# Patient Record
Sex: Female | Born: 1935 | Race: White | Hispanic: No | Marital: Married | Smoking: Never smoker
Health system: Southern US, Community
[De-identification: ages and names within clinical notes are randomized; demographics above are authoritative.]

## PROBLEM LIST (undated history)

## (undated) DIAGNOSIS — K222 Esophageal obstruction: Secondary | ICD-10-CM

## (undated) DIAGNOSIS — D509 Iron deficiency anemia, unspecified: Secondary | ICD-10-CM

## (undated) DIAGNOSIS — H3530 Unspecified macular degeneration: Secondary | ICD-10-CM

## (undated) DIAGNOSIS — E785 Hyperlipidemia, unspecified: Secondary | ICD-10-CM

## (undated) DIAGNOSIS — F0390 Unspecified dementia without behavioral disturbance: Secondary | ICD-10-CM

## (undated) DIAGNOSIS — K589 Irritable bowel syndrome without diarrhea: Secondary | ICD-10-CM

## (undated) DIAGNOSIS — F329 Major depressive disorder, single episode, unspecified: Secondary | ICD-10-CM

## (undated) DIAGNOSIS — F32A Depression, unspecified: Secondary | ICD-10-CM

## (undated) DIAGNOSIS — K5790 Diverticulosis of intestine, part unspecified, without perforation or abscess without bleeding: Secondary | ICD-10-CM

## (undated) DIAGNOSIS — E039 Hypothyroidism, unspecified: Secondary | ICD-10-CM

## (undated) DIAGNOSIS — M1990 Unspecified osteoarthritis, unspecified site: Secondary | ICD-10-CM

## (undated) DIAGNOSIS — N183 Chronic kidney disease, stage 3 (moderate): Secondary | ICD-10-CM

## (undated) DIAGNOSIS — F419 Anxiety disorder, unspecified: Secondary | ICD-10-CM

## (undated) DIAGNOSIS — I639 Cerebral infarction, unspecified: Secondary | ICD-10-CM

## (undated) DIAGNOSIS — K219 Gastro-esophageal reflux disease without esophagitis: Secondary | ICD-10-CM

## (undated) DIAGNOSIS — K56600 Partial intestinal obstruction, unspecified as to cause: Secondary | ICD-10-CM

## (undated) DIAGNOSIS — K449 Diaphragmatic hernia without obstruction or gangrene: Secondary | ICD-10-CM

## (undated) DIAGNOSIS — G4700 Insomnia, unspecified: Secondary | ICD-10-CM

## (undated) HISTORY — DX: Gastro-esophageal reflux disease without esophagitis: K21.9

## (undated) HISTORY — PX: TONSILLECTOMY: SUR1361

## (undated) HISTORY — PX: SINUS SURGERY WITH INSTATRAK: SHX5215

## (undated) HISTORY — PX: APPENDECTOMY: SHX54

## (undated) HISTORY — DX: Depression, unspecified: F32.A

## (undated) HISTORY — DX: Esophageal obstruction: K22.2

## (undated) HISTORY — DX: Anxiety disorder, unspecified: F41.9

## (undated) HISTORY — DX: Unspecified osteoarthritis, unspecified site: M19.90

## (undated) HISTORY — PX: BUNIONECTOMY: SHX129

## (undated) HISTORY — DX: Major depressive disorder, single episode, unspecified: F32.9

## (undated) HISTORY — PX: CHOLECYSTECTOMY: SHX55

## (undated) HISTORY — DX: Diaphragmatic hernia without obstruction or gangrene: K44.9

## (undated) HISTORY — DX: Unspecified macular degeneration: H35.30

## (undated) HISTORY — PX: SPINE SURGERY: SHX786

## (undated) HISTORY — DX: Partial intestinal obstruction, unspecified as to cause: K56.600

## (undated) HISTORY — DX: Irritable bowel syndrome, unspecified: K58.9

## (undated) HISTORY — DX: Diverticulosis of intestine, part unspecified, without perforation or abscess without bleeding: K57.90

## (undated) HISTORY — PX: ABDOMINAL HYSTERECTOMY: SHX81

## (undated) HISTORY — DX: Iron deficiency anemia, unspecified: D50.9

## (undated) HISTORY — DX: Insomnia, unspecified: G47.00

---

## 1993-11-11 DIAGNOSIS — K56600 Partial intestinal obstruction, unspecified as to cause: Secondary | ICD-10-CM

## 1993-11-11 HISTORY — DX: Partial intestinal obstruction, unspecified as to cause: K56.600

## 1998-05-01 ENCOUNTER — Other Ambulatory Visit: Admission: RE | Admit: 1998-05-01 | Discharge: 1998-05-01 | Payer: Self-pay | Admitting: Obstetrics and Gynecology

## 1998-08-01 ENCOUNTER — Other Ambulatory Visit: Admission: RE | Admit: 1998-08-01 | Discharge: 1998-08-01 | Payer: Self-pay | Admitting: Internal Medicine

## 1999-02-09 ENCOUNTER — Other Ambulatory Visit: Admission: RE | Admit: 1999-02-09 | Discharge: 1999-02-09 | Payer: Self-pay | Admitting: Obstetrics and Gynecology

## 1999-06-25 ENCOUNTER — Encounter (INDEPENDENT_AMBULATORY_CARE_PROVIDER_SITE_OTHER): Payer: Self-pay | Admitting: Specialist

## 1999-06-25 ENCOUNTER — Other Ambulatory Visit: Admission: RE | Admit: 1999-06-25 | Discharge: 1999-06-25 | Payer: Self-pay | Admitting: Obstetrics and Gynecology

## 1999-11-21 ENCOUNTER — Encounter: Admission: RE | Admit: 1999-11-21 | Discharge: 2000-02-19 | Payer: Self-pay | Admitting: Internal Medicine

## 2000-02-11 ENCOUNTER — Encounter: Admission: RE | Admit: 2000-02-11 | Discharge: 2000-02-11 | Payer: Self-pay | Admitting: *Deleted

## 2000-02-11 ENCOUNTER — Encounter: Payer: Self-pay | Admitting: *Deleted

## 2000-03-03 ENCOUNTER — Other Ambulatory Visit: Admission: RE | Admit: 2000-03-03 | Discharge: 2000-03-03 | Payer: Self-pay | Admitting: Obstetrics and Gynecology

## 2000-05-02 ENCOUNTER — Encounter: Admission: RE | Admit: 2000-05-02 | Discharge: 2000-05-02 | Payer: Self-pay | Admitting: Obstetrics and Gynecology

## 2000-05-02 ENCOUNTER — Encounter: Payer: Self-pay | Admitting: Obstetrics and Gynecology

## 2000-08-12 ENCOUNTER — Encounter: Payer: Self-pay | Admitting: Internal Medicine

## 2000-12-27 ENCOUNTER — Encounter: Payer: Self-pay | Admitting: Neurology

## 2000-12-27 ENCOUNTER — Encounter: Payer: Self-pay | Admitting: *Deleted

## 2000-12-27 ENCOUNTER — Inpatient Hospital Stay (HOSPITAL_COMMUNITY): Admission: EM | Admit: 2000-12-27 | Discharge: 2000-12-30 | Payer: Self-pay | Admitting: *Deleted

## 2000-12-28 ENCOUNTER — Encounter: Payer: Self-pay | Admitting: Neurology

## 2000-12-29 ENCOUNTER — Encounter: Payer: Self-pay | Admitting: Neurology

## 2000-12-30 ENCOUNTER — Encounter: Payer: Self-pay | Admitting: Neurology

## 2001-04-08 ENCOUNTER — Ambulatory Visit (HOSPITAL_COMMUNITY): Admission: RE | Admit: 2001-04-08 | Discharge: 2001-04-08 | Payer: Self-pay | Admitting: Internal Medicine

## 2001-04-08 ENCOUNTER — Encounter: Payer: Self-pay | Admitting: Internal Medicine

## 2001-05-12 ENCOUNTER — Other Ambulatory Visit: Admission: RE | Admit: 2001-05-12 | Discharge: 2001-05-12 | Payer: Self-pay | Admitting: Obstetrics and Gynecology

## 2001-07-07 ENCOUNTER — Encounter: Payer: Self-pay | Admitting: Obstetrics and Gynecology

## 2001-07-07 ENCOUNTER — Encounter: Admission: RE | Admit: 2001-07-07 | Discharge: 2001-07-07 | Payer: Self-pay | Admitting: Obstetrics and Gynecology

## 2001-12-30 ENCOUNTER — Encounter (HOSPITAL_COMMUNITY): Admission: RE | Admit: 2001-12-30 | Discharge: 2002-03-30 | Payer: Self-pay | Admitting: Internal Medicine

## 2002-05-12 ENCOUNTER — Other Ambulatory Visit: Admission: RE | Admit: 2002-05-12 | Discharge: 2002-05-12 | Payer: Self-pay | Admitting: Obstetrics and Gynecology

## 2002-08-04 ENCOUNTER — Encounter: Admission: RE | Admit: 2002-08-04 | Discharge: 2002-08-04 | Payer: Self-pay | Admitting: Obstetrics and Gynecology

## 2002-08-04 ENCOUNTER — Encounter: Payer: Self-pay | Admitting: Obstetrics and Gynecology

## 2003-07-29 ENCOUNTER — Encounter: Admission: RE | Admit: 2003-07-29 | Discharge: 2003-07-29 | Payer: Self-pay | Admitting: Internal Medicine

## 2003-07-29 ENCOUNTER — Encounter (HOSPITAL_BASED_OUTPATIENT_CLINIC_OR_DEPARTMENT_OTHER): Payer: Self-pay | Admitting: Internal Medicine

## 2003-08-08 ENCOUNTER — Encounter (HOSPITAL_BASED_OUTPATIENT_CLINIC_OR_DEPARTMENT_OTHER): Payer: Self-pay | Admitting: Internal Medicine

## 2003-08-08 ENCOUNTER — Encounter: Admission: RE | Admit: 2003-08-08 | Discharge: 2003-08-08 | Payer: Self-pay | Admitting: Internal Medicine

## 2003-08-23 ENCOUNTER — Ambulatory Visit (HOSPITAL_COMMUNITY): Admission: RE | Admit: 2003-08-23 | Discharge: 2003-08-23 | Payer: Self-pay | Admitting: Internal Medicine

## 2003-11-16 ENCOUNTER — Emergency Department (HOSPITAL_COMMUNITY): Admission: AD | Admit: 2003-11-16 | Discharge: 2003-11-16 | Payer: Self-pay | Admitting: Family Medicine

## 2004-03-12 ENCOUNTER — Emergency Department (HOSPITAL_COMMUNITY): Admission: EM | Admit: 2004-03-12 | Discharge: 2004-03-12 | Payer: Self-pay | Admitting: Family Medicine

## 2004-08-02 ENCOUNTER — Encounter: Admission: RE | Admit: 2004-08-02 | Discharge: 2004-08-02 | Payer: Self-pay | Admitting: Internal Medicine

## 2004-09-17 ENCOUNTER — Encounter: Admission: RE | Admit: 2004-09-17 | Discharge: 2004-09-17 | Payer: Self-pay | Admitting: Otolaryngology

## 2004-09-18 ENCOUNTER — Encounter (INDEPENDENT_AMBULATORY_CARE_PROVIDER_SITE_OTHER): Payer: Self-pay | Admitting: *Deleted

## 2004-09-18 ENCOUNTER — Ambulatory Visit (HOSPITAL_BASED_OUTPATIENT_CLINIC_OR_DEPARTMENT_OTHER): Admission: RE | Admit: 2004-09-18 | Discharge: 2004-09-18 | Payer: Self-pay | Admitting: Otolaryngology

## 2004-09-18 ENCOUNTER — Ambulatory Visit (HOSPITAL_COMMUNITY): Admission: RE | Admit: 2004-09-18 | Discharge: 2004-09-18 | Payer: Self-pay | Admitting: Otolaryngology

## 2004-12-04 ENCOUNTER — Ambulatory Visit (HOSPITAL_COMMUNITY): Admission: RE | Admit: 2004-12-04 | Discharge: 2004-12-04 | Payer: Self-pay | Admitting: Allergy and Immunology

## 2005-01-12 ENCOUNTER — Observation Stay (HOSPITAL_COMMUNITY): Admission: EM | Admit: 2005-01-12 | Discharge: 2005-01-12 | Payer: Self-pay | Admitting: *Deleted

## 2005-04-09 ENCOUNTER — Encounter: Admission: RE | Admit: 2005-04-09 | Discharge: 2005-04-09 | Payer: Self-pay | Admitting: Internal Medicine

## 2005-08-16 ENCOUNTER — Encounter: Admission: RE | Admit: 2005-08-16 | Discharge: 2005-08-16 | Payer: Self-pay | Admitting: Rheumatology

## 2005-08-21 ENCOUNTER — Encounter: Admission: RE | Admit: 2005-08-21 | Discharge: 2005-08-21 | Payer: Self-pay | Admitting: Internal Medicine

## 2006-03-19 ENCOUNTER — Encounter: Admission: RE | Admit: 2006-03-19 | Discharge: 2006-03-19 | Payer: Self-pay | Admitting: Cardiovascular Disease

## 2006-04-22 ENCOUNTER — Ambulatory Visit (HOSPITAL_COMMUNITY): Admission: RE | Admit: 2006-04-22 | Discharge: 2006-04-22 | Payer: Self-pay | Admitting: Internal Medicine

## 2006-07-16 ENCOUNTER — Encounter: Admission: RE | Admit: 2006-07-16 | Discharge: 2006-07-16 | Payer: Self-pay | Admitting: Internal Medicine

## 2006-09-24 ENCOUNTER — Ambulatory Visit: Payer: Self-pay | Admitting: Internal Medicine

## 2006-10-07 ENCOUNTER — Ambulatory Visit: Payer: Self-pay | Admitting: Internal Medicine

## 2007-03-26 ENCOUNTER — Ambulatory Visit (HOSPITAL_COMMUNITY): Admission: RE | Admit: 2007-03-26 | Discharge: 2007-03-26 | Payer: Self-pay | Admitting: Internal Medicine

## 2007-03-30 ENCOUNTER — Ambulatory Visit (HOSPITAL_COMMUNITY): Admission: RE | Admit: 2007-03-30 | Discharge: 2007-03-30 | Payer: Self-pay | Admitting: Internal Medicine

## 2007-05-13 ENCOUNTER — Ambulatory Visit: Payer: Self-pay | Admitting: Internal Medicine

## 2007-05-13 LAB — CONVERTED CEMR LAB
Basophils Absolute: 0 10*3/uL (ref 0.0–0.1)
Basophils Relative: 0.7 % (ref 0.0–1.0)
Eosinophils Absolute: 0.6 10*3/uL (ref 0.0–0.6)
Eosinophils Relative: 9.2 % — ABNORMAL HIGH (ref 0.0–5.0)
Ferritin: 145.8 ng/mL (ref 10.0–291.0)
HCT: 38.9 % (ref 36.0–46.0)
Hemoglobin: 12.9 g/dL (ref 12.0–15.0)
Iron: 54 ug/dL (ref 42–145)
Lymphocytes Relative: 23 % (ref 12.0–46.0)
MCHC: 33.2 g/dL (ref 30.0–36.0)
MCV: 90.3 fL (ref 78.0–100.0)
Monocytes Absolute: 0.5 10*3/uL (ref 0.2–0.7)
Monocytes Relative: 7.6 % (ref 3.0–11.0)
Neutro Abs: 3.9 10*3/uL (ref 1.4–7.7)
Neutrophils Relative %: 59.5 % (ref 43.0–77.0)
Platelets: 251 10*3/uL (ref 150–400)
RBC: 4.31 M/uL (ref 3.87–5.11)
RDW: 23.9 % — ABNORMAL HIGH (ref 11.5–14.6)
Saturation Ratios: 17.3 % — ABNORMAL LOW (ref 20.0–50.0)
Transferrin: 222.5 mg/dL (ref >=212.0)
WBC: 6.5 10*3/uL (ref 4.5–10.5)

## 2007-05-26 ENCOUNTER — Ambulatory Visit: Payer: Self-pay | Admitting: Internal Medicine

## 2007-05-26 ENCOUNTER — Encounter: Payer: Self-pay | Admitting: Internal Medicine

## 2007-12-30 ENCOUNTER — Encounter: Admission: RE | Admit: 2007-12-30 | Discharge: 2007-12-30 | Payer: Self-pay | Admitting: Internal Medicine

## 2008-03-12 DIAGNOSIS — D509 Iron deficiency anemia, unspecified: Secondary | ICD-10-CM

## 2008-03-12 DIAGNOSIS — K5730 Diverticulosis of large intestine without perforation or abscess without bleeding: Secondary | ICD-10-CM | POA: Insufficient documentation

## 2008-03-12 DIAGNOSIS — K222 Esophageal obstruction: Secondary | ICD-10-CM

## 2008-03-12 DIAGNOSIS — F341 Dysthymic disorder: Secondary | ICD-10-CM

## 2008-03-12 DIAGNOSIS — D649 Anemia, unspecified: Secondary | ICD-10-CM

## 2008-03-12 DIAGNOSIS — K449 Diaphragmatic hernia without obstruction or gangrene: Secondary | ICD-10-CM | POA: Insufficient documentation

## 2008-03-12 DIAGNOSIS — M129 Arthropathy, unspecified: Secondary | ICD-10-CM | POA: Insufficient documentation

## 2008-03-12 DIAGNOSIS — J45909 Unspecified asthma, uncomplicated: Secondary | ICD-10-CM | POA: Insufficient documentation

## 2008-04-18 ENCOUNTER — Ambulatory Visit (HOSPITAL_COMMUNITY): Admission: RE | Admit: 2008-04-18 | Discharge: 2008-04-18 | Payer: Self-pay | Admitting: Surgery

## 2008-12-23 ENCOUNTER — Encounter: Admission: RE | Admit: 2008-12-23 | Discharge: 2008-12-23 | Payer: Self-pay | Admitting: Internal Medicine

## 2009-09-12 ENCOUNTER — Encounter: Admission: RE | Admit: 2009-09-12 | Discharge: 2009-09-12 | Payer: Self-pay | Admitting: Internal Medicine

## 2009-10-02 ENCOUNTER — Ambulatory Visit: Payer: Self-pay | Admitting: Cardiology

## 2009-10-02 ENCOUNTER — Inpatient Hospital Stay (HOSPITAL_COMMUNITY): Admission: EM | Admit: 2009-10-02 | Discharge: 2009-10-10 | Payer: Self-pay | Admitting: Emergency Medicine

## 2009-10-03 ENCOUNTER — Encounter (HOSPITAL_BASED_OUTPATIENT_CLINIC_OR_DEPARTMENT_OTHER): Payer: Self-pay | Admitting: Internal Medicine

## 2009-10-04 ENCOUNTER — Ambulatory Visit: Payer: Self-pay | Admitting: Physical Medicine & Rehabilitation

## 2009-10-09 ENCOUNTER — Encounter (HOSPITAL_BASED_OUTPATIENT_CLINIC_OR_DEPARTMENT_OTHER): Payer: Self-pay | Admitting: Internal Medicine

## 2009-10-10 ENCOUNTER — Ambulatory Visit: Payer: Self-pay | Admitting: Surgery

## 2009-10-10 ENCOUNTER — Ambulatory Visit: Payer: Self-pay | Admitting: Physical Medicine & Rehabilitation

## 2009-10-10 ENCOUNTER — Inpatient Hospital Stay (HOSPITAL_COMMUNITY)
Admission: RE | Admit: 2009-10-10 | Discharge: 2009-10-23 | Payer: Self-pay | Admitting: Physical Medicine & Rehabilitation

## 2009-10-10 ENCOUNTER — Encounter (HOSPITAL_BASED_OUTPATIENT_CLINIC_OR_DEPARTMENT_OTHER): Payer: Self-pay | Admitting: Internal Medicine

## 2009-10-12 ENCOUNTER — Ambulatory Visit: Payer: Self-pay | Admitting: Psychology

## 2009-12-22 ENCOUNTER — Telehealth: Payer: Self-pay | Admitting: Internal Medicine

## 2010-01-16 ENCOUNTER — Telehealth: Payer: Self-pay | Admitting: Internal Medicine

## 2010-01-18 ENCOUNTER — Telehealth: Payer: Self-pay | Admitting: Internal Medicine

## 2010-02-02 ENCOUNTER — Ambulatory Visit (HOSPITAL_COMMUNITY): Admission: RE | Admit: 2010-02-02 | Discharge: 2010-02-02 | Payer: Self-pay | Admitting: Surgery

## 2010-09-19 ENCOUNTER — Encounter: Admission: RE | Admit: 2010-09-19 | Discharge: 2010-09-19 | Payer: Self-pay | Admitting: Internal Medicine

## 2010-12-11 NOTE — Progress Notes (Signed)
Summary: Running low on her meds  Phone Note Call from Patient Call back at Home Phone [phone]   Caller: Mr Mcqueary Call For: Dr Juanda Chance Summary of Call: Was given [facility] kind [facility] liquid [facility] and used most of it up and the pharmacy wont let them refill it because it's soon. Wonders if one spoon everyday is wrong and is taking too much? Initial call taken by: Leanor Kail [facility],  January 16, 2010 10:21 AM  Follow-up for Phone Call        I have advised patient that she should only be taking 1 teaspoon (5 ml) of megace daily. I have advised her that if she is out of the medication, then she may be taking the prescription wrong-maybe taking 1 tablespoon instead???. Patient was given 10 ounces megace on 12/22/09 which converts into 295 ml. If she takes 5 ml daily x 30 days, she has taken 150 ml which would mean she should still have 1/2 bottle left over. Patient states that she has enough to get her through until 3/11 when she can get another refill and insists that she is taking the correct amount of 5 ml once daily. I have asked her to purchase some sort of measuring device that shows ml's so she will be sure she is taking the correct amount. Patient verbalizes understanding. Follow-up by: Hortense Ramal CMA Duncan Dull),  January 16, 2010 2:50 PM

## 2010-12-11 NOTE — Progress Notes (Signed)
Summary: Triage-Loss of appetite  Phone Note Call from Patient Call back at [facility] Phone [phone]   Caller: Patient Call For: Dr. Juanda Chance Reason for Call: Talk to Nurse Summary of Call: Pt. is having stomach problems in the mornings. Nauseated and no appetite. Initial call taken by: Karna Christmas,  December 22, 2009 10:09 AM  Follow-up for Phone Call        Pt. states she had a stroke 2 monthes ago, she has lost her appetite, worse in the mornings. Pt. states she has lost 2-3 pounds. States Dr. Eloise Harman advised her call Dr.Veretta Sabourin.  1) Eat smaller, more frequent meals 2) Supplement diet with Boost, Ensure or Carnation Instant Breakfast 2-3x daily. 3) I will call pt., with new orders, after MD reviews.  DR.Amir Glaus PLEASE ADVISE  Follow-up by: Laureen Ochs LPN,  December 22, 2009 10:50 AM  Additional Follow-up for Phone Call Additional follow up Details #1::        I saw her in a grocery store last week and we spoke some. She needs to be on some sort of acid reducer, Prelosec 20 mg daily , # 30, 2 refills. Start Megace liquid 40mg /1cc, Disp 10oz, 1 teaspoon by mouth once daily for apetite., 1 refill Additional Follow-up by: Hart Carwin MD,  December 22, 2009 3:44 PM    Additional Follow-up for Phone Call Additional follow up Details #2::    Above MD orders reviewed with patient. Meds to her pharmacy. Pt. instructed to call back as needed.  Follow-up by: Laureen Ochs LPN,  December 22, 2009 4:06 PM  New/Updated Medications: OMEPRAZOLE 20 MG CPDR (OMEPRAZOLE) Take 1 by mouth every morning 30 minutes before breakfast. MEGACE ORAL 40 MG/ML SUSP (MEGESTROL ACETATE) Take 1 tsp. by mouth once daily. Prescriptions: MEGACE ORAL 40 MG/ML SUSP (MEGESTROL ACETATE) Take 1 tsp. by mouth once daily.  #10oz. x 1   Entered by:   Laureen Ochs LPN   Authorized by:   Hart Carwin MD   Signed by:   Laureen Ochs LPN on 09/81/1914   Method used:   [facility]        [facility] [address] [phone]* (retail)       [address]       [place], [place]  [address]       Ph: [phone] or [phone]       Fax: [phone]   RxID:   [id number] OMEPRAZOLE 20 MG CPDR (OMEPRAZOLE) Take 1 by mouth every morning 30 minutes before breakfast.  #30 x 6   Entered by:   Laureen Ochs LPN   Authorized by:   Hart Carwin MD   Signed by:   Laureen Ochs LPN on 56/43/3295   Method used:   [facility]        [facility] [address] [phone]* (retail)       [address]       [place], [place]  [address]       Ph: [phone] or [phone]       Fax: [phone]   RxID:   [id number]

## 2010-12-11 NOTE — Progress Notes (Signed)
Summary: Triage  Phone Note Call from Patient Call back at Home Phone [phone]   Caller: spouse  Call For: Melanie Levy Reason for Call: Talk to Nurse Summary of Call: Patient wants to speak to nurse regarding medication (megace) Initial call taken by: Tawni Levy,  January 18, 2010 1:51 PM  Follow-up for Phone Call        Pt. states she is almost out of Megace, cannot get it refilled for 10 days, she wants a new script sent over. I spoke w/the Pharmacist at her pharmacy, pt. was given a 60 day supply on 12-22-09, she cannot get a refill until 02-05-10. I explained to pt. that she should only take 1 tsp. full every day, she needs to measure carefully. She will have to use what she has and then wait until 02-05-10 to get a refill and be careful about her dose in the future. Pt. instructed to call back as needed.  Follow-up by: Laureen Ochs LPN,  January 18, 2010 2:54 PM

## 2011-02-12 LAB — COMPREHENSIVE METABOLIC PANEL
ALT: 22 U/L (ref 0–35)
AST: 24 U/L (ref 0–37)
Albumin: 3.2 g/dL — ABNORMAL LOW (ref 3.5–5.2)
Alkaline Phosphatase: 59 U/L (ref 39–117)
BUN: 14 mg/dL (ref 6–23)
CO2: 24 mEq/L (ref 19–32)
Calcium: 8.4 mg/dL (ref 8.4–10.5)
Chloride: 104 mEq/L (ref 96–112)
Creatinine, Ser: 0.97 mg/dL (ref 0.4–1.2)
GFR calc Af Amer: >60 mL/min (ref >60)
GFR calc non Af Amer: 55 mL/min — ABNORMAL LOW (ref >60)
Glucose, Bld: 104 mg/dL — ABNORMAL HIGH (ref 70–99)
Potassium: 3.7 mEq/L (ref 3.5–5.1)
Sodium: 134 mEq/L — ABNORMAL LOW (ref 135–145)
Total Bilirubin: 0.4 mg/dL (ref 0.3–1.2)
Total Protein: 5.6 g/dL — ABNORMAL LOW (ref 6.0–8.3)

## 2011-02-12 LAB — URINALYSIS, ROUTINE W REFLEX MICROSCOPIC
Bilirubin Urine: NEGATIVE
Glucose, UA: NEGATIVE mg/dL
Hgb urine dipstick: NEGATIVE
Ketones, ur: NEGATIVE mg/dL
Nitrite: NEGATIVE
Protein, ur: NEGATIVE mg/dL
Specific Gravity, Urine: 1.01 (ref 1.005–1.030)
Urobilinogen, UA: 0.2 mg/dL (ref 0.0–1.0)
pH: 6.5 (ref 5.0–8.0)

## 2011-02-12 LAB — CBC
HCT: 32.8 % — ABNORMAL LOW (ref 36.0–46.0)
Hemoglobin: 11.3 g/dL — ABNORMAL LOW (ref 12.0–15.0)
MCHC: 34.3 g/dL (ref 30.0–36.0)
MCV: 102.2 fL — ABNORMAL HIGH (ref 78.0–100.0)
Platelets: 302 10*3/uL (ref 150–400)
RBC: 3.21 MIL/uL — ABNORMAL LOW (ref 3.87–5.11)
RDW: 12.8 % (ref 11.5–15.5)
WBC: 6.7 10*3/uL (ref 4.0–10.5)

## 2011-02-12 LAB — BASIC METABOLIC PANEL
BUN: 13 mg/dL (ref 6–23)
Creatinine, Ser: 1.18 mg/dL (ref 0.4–1.2)
GFR calc Af Amer: 53 mL/min — ABNORMAL LOW (ref >60)
GFR calc non Af Amer: 44 mL/min — ABNORMAL LOW (ref >60)

## 2011-02-12 LAB — PREALBUMIN: Prealbumin: 28.9 mg/dL (ref 18.0–45.0)

## 2011-02-12 LAB — DIFFERENTIAL
Basophils Absolute: 0.1 10*3/uL (ref 0.0–0.1)
Basophils Relative: 1 % (ref 0–1)
Eosinophils Absolute: 0.3 10*3/uL (ref 0.0–0.7)
Eosinophils Relative: 4 % (ref 0–5)
Lymphocytes Relative: 21 % (ref 12–46)
Lymphs Abs: 1.4 10*3/uL (ref 0.7–4.0)
Monocytes Absolute: 0.6 10*3/uL (ref 0.1–1.0)
Monocytes Relative: 9 % (ref 3–12)
Neutro Abs: 4.4 10*3/uL (ref 1.7–7.7)
Neutrophils Relative %: 65 % (ref 43–77)

## 2011-02-12 LAB — URINE CULTURE
Colony Count: 75000
Colony Count: NO GROWTH
Culture: NO GROWTH

## 2011-02-13 LAB — CBC
HCT: 36.4 % (ref 36.0–46.0)
Hemoglobin: 10.8 g/dL — ABNORMAL LOW (ref 12.0–15.0)
Hemoglobin: 11.1 g/dL — ABNORMAL LOW (ref 12.0–15.0)
Hemoglobin: 12.4 g/dL (ref 12.0–15.0)
MCHC: 33.6 g/dL (ref 30.0–36.0)
MCHC: 33.8 g/dL (ref 30.0–36.0)
MCV: 103 fL — ABNORMAL HIGH (ref 78.0–100.0)
MCV: 103.5 fL — ABNORMAL HIGH (ref 78.0–100.0)
MCV: 103.5 fL — ABNORMAL HIGH (ref 78.0–100.0)
RBC: 3.1 MIL/uL — ABNORMAL LOW (ref 3.87–5.11)
RBC: 3.19 MIL/uL — ABNORMAL LOW (ref 3.87–5.11)
RBC: 3.53 MIL/uL — ABNORMAL LOW (ref 3.87–5.11)
RDW: 12.6 % (ref 11.5–15.5)
WBC: 9.6 10*3/uL (ref 4.0–10.5)
WBC: 9.9 10*3/uL (ref 4.0–10.5)

## 2011-02-13 LAB — URINALYSIS, ROUTINE W REFLEX MICROSCOPIC
Bilirubin Urine: NEGATIVE
Ketones, ur: NEGATIVE mg/dL
Nitrite: NEGATIVE
Protein, ur: NEGATIVE mg/dL
Urobilinogen, UA: 0.2 mg/dL (ref 0.0–1.0)

## 2011-02-13 LAB — TSH: TSH: 3.276 u[IU]/mL (ref 0.350–4.500)

## 2011-02-13 LAB — COMPREHENSIVE METABOLIC PANEL
ALT: 14 U/L (ref 0–35)
AST: 20 U/L (ref 0–37)
CO2: 24 mEq/L (ref 19–32)
Calcium: 8.8 mg/dL (ref 8.4–10.5)
Chloride: 106 mEq/L (ref 96–112)
GFR calc Af Amer: 57 mL/min — ABNORMAL LOW (ref >60)
GFR calc non Af Amer: 47 mL/min — ABNORMAL LOW (ref >60)
Sodium: 138 mEq/L (ref 135–145)

## 2011-02-13 LAB — DIFFERENTIAL
Eosinophils Absolute: 0.2 10*3/uL (ref 0.0–0.7)
Eosinophils Relative: 2 % (ref 0–5)
Lymphs Abs: 1.2 10*3/uL (ref 0.7–4.0)
Monocytes Relative: 6 % (ref 3–12)

## 2011-02-13 LAB — BASIC METABOLIC PANEL
CO2: 23 mEq/L (ref 19–32)
CO2: 25 mEq/L (ref 19–32)
Calcium: 8.3 mg/dL — ABNORMAL LOW (ref 8.4–10.5)
Calcium: 8.5 mg/dL (ref 8.4–10.5)
Chloride: 103 mEq/L (ref 96–112)
Chloride: 114 mEq/L — ABNORMAL HIGH (ref 96–112)
GFR calc Af Amer: >60 mL/min (ref >60)
Glucose, Bld: 109 mg/dL — ABNORMAL HIGH (ref 70–99)
Sodium: 132 mEq/L — ABNORMAL LOW (ref 135–145)
Sodium: 143 mEq/L (ref 135–145)

## 2011-02-13 LAB — URINALYSIS, MICROSCOPIC ONLY
Glucose, UA: NEGATIVE mg/dL
Specific Gravity, Urine: 1.01 (ref 1.005–1.030)
pH: 5.5 (ref 5.0–8.0)

## 2011-02-13 LAB — URINE CULTURE

## 2011-02-13 LAB — CK TOTAL AND CKMB (NOT AT ARMC): Relative Index: INVALID (ref 0.0–2.5)

## 2011-02-13 LAB — LIPID PANEL
Cholesterol: 142 mg/dL (ref 0–200)
LDL Cholesterol: 63 mg/dL (ref 0–99)

## 2011-02-13 LAB — TROPONIN I
Troponin I: 0.03 ng/mL (ref 0.00–0.06)
Troponin I: 0.03 ng/mL (ref 0.00–0.06)

## 2011-02-13 LAB — RPR: RPR Ser Ql: NONREACTIVE

## 2011-03-14 ENCOUNTER — Ambulatory Visit (HOSPITAL_COMMUNITY): Payer: Medicare Other | Attending: Surgery

## 2011-03-14 DIAGNOSIS — M810 Age-related osteoporosis without current pathological fracture: Secondary | ICD-10-CM | POA: Insufficient documentation

## 2011-03-26 NOTE — Assessment & Plan Note (Signed)
[facility]                         GASTROENTEROLOGY OFFICE NOTE   NAME:Melanie Levy, Melanie Levy                    MRN:          [id number]  DATE:05/26/2007                            DOB:          1928-09-10    PROCEDURE:  Small bowel capsule endoscopy.   Please see the full report and photos in the office chart, and on the  computer generated report.   The findings of this study showed a completed study but much of small  bowel mucosa obscured by dark fluid.   There is a red spot in the gastric antrum that I suspect is related to  gastritis, AVM much less likely.  There is a red patch of mucosa in the  jejunum vs. partially digested food that is retained and red.  The exam  was limited by poor preparation.  Further plans per Dr. Lina Sar who  ordered the study.     Iva Boop, MD,FACG  Electronically Signed    CEG/MedQ  DD: 05/27/2007  DT: 05/28/2007  Job #: [id number]   cc:   Barry Dienes. Eloise Harman, M.D.

## 2011-03-26 NOTE — Assessment & Plan Note (Signed)
[facility]                         GASTROENTEROLOGY OFFICE NOTE   NAME:MACKENZIEShaterica, Mcclatchy                    MRN:          [id number]  DATE:05/13/2007                            DOB:          [birth year]    Ms. Brotzman is a 77 year old white female who is here today for  evaluation of iron deficiency anemia.  We have not received reports of  her blood count from Dr. Jarold Motto, but she reports that she was told to  have a low blood count.  We had seen Ms. Ronda on multiple occasions  in the past for partial small bowel obstruction in 1995, gastric ulcer  in 1994, and iron deficiency anemia which was fully evaluated with  several endoscopies, and a last colonoscopy in October 2001.  She has a  history of esophageal stricture, dilated last time in November 2007.  She had severe iron deficiency anemia in 2002 when her iron saturation  was down to 3%.  She received iron infusion, in a subsequent year her  iron saturation fell down again to 4%.  She always has responded to oral  and intravenous iron.  In my records, her last hemoglobin in March 2004  was up to 12.2, hematocrit 37.6.  She has not been taking any oral  supplements, or at least I have not reported any on her medication  survey today.  She denies currently any abdominal pain or symptoms of  gastroesophageal reflux.  Her bowel habits are normal to somewhat  diarrheal.   MEDICATIONS:  1. Levothyroxine 25 mcg p.o. daily.  2. Atenolol 25 mg p.o. daily.  3. Temazepam 30 mg p.o. daily.  4. Tylenol nebulizer.  5. Lactrase.   PHYSICAL EXAMINATION:  Blood pressure 138/62, pulse 62 and weight 132  pounds.  She was alert, oriented, and in no distress.  Sclerae nonicteric.  Oral cavity was normal.  LUNGS:  Clear to auscultation.  COR:  Normal S1, normal S2.  ABDOMEN:  Soft with healed surgical scars.  Normoactive bowel sounds.  No tenderness.  RECTAL EXAM:  Soft, Hemoccult-negative stool.   IMPRESSION:  A 77 year old white female with a history of chronic iron  deficiency anemia, responsive to iron supplements and iron infusions.  We have not been able to identify the source of the bleeding over the  past several years.  AV malformation would be a possibility.  Also  possible chronic esophagitis with esophageal stricture but that is less  likely scenario since her reflux really has not been clinically  significant.   PLAN:  1. Small bowel capsule endoscopy scheduled to rule out AVM.  2. If negative, she may need another colonoscopy.  Last one was done 7      years ago.  3. Recheck her CBC and iron studies today.  She reports having an      infusion, and I think it      might have been an iron infusion in the last several weeks, ordered      by Dr. Jarold Motto.  She ought to be on oral supplements on a chronic      basis.  Hedwig Morton. Juanda Chance, MD  Electronically Signed    DMB/MedQ  DD: 05/13/2007  DT: 05/13/2007  Job #: [id number]   cc:   Ivery Quale, MD

## 2011-03-29 NOTE — Discharge Summary (Signed)
[place]  Patient:    Melanie Levy, Melanie Levy                    MRN: [id number] Adm. Date:  [id number] Disc. Date: 12/30/00 Attending:  Lesly Levy CC:         Melanie Levy, M.D.   Discharge Summary  ADMISSION DIAGNOSES: 1. Episode of headache, blurred vision, near syncope; rule out transient    ischemic attack. 2. History of asthma and allergies.  DISCHARGE DIAGNOSES: 1. Episode of headache, blurred vision, near syncope; probable migraine event. 2. Left lower lobe nodule.  PROCEDURES: 1. CT scan of the brain. 2. MRI scan of the brain. 3. MRI angiogram. 4. Carotid Doppler study. 5. Two dimensional echocardiogram. 6. CT scan of the chest.  COMPLICATIONS:  Complications for the above procedures is none.  HISTORY OF PRESENT ILLNESS:  Melanie Levy is a 77 year old right-handed white female born Aug 04, 1928, with a history of allergies and asthma.  The patient is followed by Dr. Ivery Levy and has complained of some problems with headaches "all of her life."  The patient has attributed the headaches to "allergies."  The patient describes rapid onset headache with certain perfumes or odors and the headaches are occurring fairly frequently. The patient was brought into the hospital after an event that occurred on the day of admission with visual blurring, tingling sensation of the left arm, and a feeling of near syncope.  The patient was brought to the hospital for further evaluation.  A CT scan of the brain showed some basilar ganglion calcifications and calcifications of the vertebral arteries but otherwise no significant change noted.  The patient was brought in to rule out TIA event.  PAST MEDICAL HISTORY: 1. History of asthma. 2. History of arthritis. 3. History of headache and near syncopal event on the day of admission. 4. Status post tonsillectomy. 5. History of appendectomy. 6. History of low back  surgery. 7. History of gallbladder surgery. 8. History of abdominal complaints, followed by Dr. Lina Levy. 9. Questionable peptic ulcer disease.  SOCIAL HISTORY:  The patient does not smoke.  Drinks alcohol on occasion.  ALLERGIES:  She has an allergy to penicillin.  MEDICATIONS:  She was on no medications at the time of admission.  HISTORY OF PRESENT ILLNESS:  Please refer to history and physical dictation summary for social history, family history, review of systems, physical examination.  LABORATORY DATA:  The laboratory values are notable for CPK of 707, MB fraction 1.5, troponin-I level 0.1.  Repeat CPK was 397.  White count 7.4, hemoglobin 11.9, hematocrit 35.8, MCV 98.4, platelets of 287,000.  INR 0.9. Sodium 137, potassium 4.8, chloride 107, CO2 26, glucose 90, BUN 13, creatinine 0.9, calcium 8.6, total protein 6.1, albumin 3.8, AST 43, ALT 17, alkaline phosphatase 100, total bilirubin 1.0.  EKG reveals normal sinus rhythm and normal EKG.  Chest x-ray shows probable chronic obstructive pulmonary disease, possible soft tissue mass at the right base.  HOSPITAL COURSE:  The patient has done fairly well.  The patient was set up for an MRI scan of the brain which showed no acute abnormality.  Generalized atrophy was seen.  The patient has had an MRI angiogram that shows evidence of suboptimal exam but no significant vascular stenosis is seen.  Carotid Doppler study was also performed showing evidence of 40-60% stenosis of the left internal carotid artery.  The right internal carotid artery appeared to be normal.  Antegrade flow of the vertebral arteries was seen.  The patient underwent a 2-D echocardiogram which was unremarkable with evidence of normal left ventricular function, 55-60% ejection fraction, and mild aortic valvular regurgitation was seen.  No mitral valve prolapse was noted.  The patient has been set up for a CT scan of the chest which has not yet been done.   If this study is unremarkable, the patient will be discharged to home today.  The patient will be on aspirin 325 mg one a day.  There is a possibility that the patient may be having frequent migraine.  This will need to be evaluated as an outpatient.  A trial on Depakote may be appropriate. The patient appeared to have evidence in the hospital of some mild memory deficits.  Further investigation of this should be done as an outpatient.  FOLLOW-UP:  The patient will follow up with Melanie Levy to see Dr. Shari Levy in three weeks.  DISCHARGE INSTRUCTIONS:  No dietary restrictions. DD:  12/30/00 TD:  12/30/00 Job: [id number]

## 2011-03-29 NOTE — Op Note (Signed)
NAMELEILANEE, RIGHETTI             ACCOUNT NO.:  [id number]   MEDICAL RECORD NO.:  [id number]          PATIENT TYPE:  AMB   LOCATION:  DSC                          FACILITY:  [facility]   PHYSICIAN:  Christopher E. Ezzard Standing, M.D.DATE OF BIRTH:  04/11/1928   DATE OF PROCEDURE:  09/18/2004  DATE OF DISCHARGE:                                 OPERATIVE REPORT   PREOPERATIVE DIAGNOSIS:  Bilateral polypoid sinus disease with bilateral  ethmoid and maxillary sinus polyps.   POSTOPERATIVE DIAGNOSIS:  Bilateral polypoid sinus disease with bilateral  ethmoid and maxillary sinus polyps.   OPERATION PERFORMED:  Functional endoscopic sinus surgery with bilateral  ethmoidectomies with removal of polyps, bilateral maxillary ostial  enlargement with removal of polyps.  Simple submucosal cauterization  inferior turbinate reductions.   SURGEON:  Kristine Garbe. Ezzard Standing, M.D.   ANESTHESIA:  General endotracheal.   COMPLICATIONS:  None.   INDICATIONS FOR PROCEDURE:  Laterra Lubinski is a 77 year old female who has  had chronic problems with her sinuses.  She has allergies for which she sees  Dr. Corinda Gubler.  Over the last several months, she has been having daily  headaches which she feels are related to her sinuses with headaches between  the eyes and the cheek region.  On CT scan this showed polypoid disease  within the ethmoid and maxillary sinuses bilaterally, right side a little  bit worse than the left.  The frontal sinuses and sphenoid sinuses were  relatively.  Because of the persistent headache and polypoid disease, she  was taken to the operating room at this time for endoscopic ethmoidectomy  and maxillary ostial enlargement.   DESCRIPTION OF PROCEDURE:  After adequate endotracheal anesthesia, the  patient received 1 g Ancef IV intraoperatively.  Nose was prepped with  cotton pledgets soaked in decongestant and the middle meatus regions were  injected with Xylocaine with epinephrine for  hemostasis.  Using the 0 degree  endoscope, the right side was approached first.  The uncinate process was  excised and removed.  The anterior ethmoid regions were opened up.  There  was some thickened mucosa in the ethmoid sinuses anteriorly.  Opening more  posteriorly there was one area of thickened mucosa and polyp disease within  the posterior ethmoid on the right side.  The maxillary ostia was identified  with a curved suction and was enlarged with backbiting and straight through  cut forceps.  There were one or two polyps that could be reached around the  opening of the maxillary ostia which were removed.  There were a few other  smaller polyps along the floor of the right maxillary sinus which could not  really be adequately reached with instruments but could be visualized with a  33 scope.  After opening up the maxillary ostia on the right side and  removing a few of the polyps, the right side was completed.  Next, attention  was carried to the left side.  Again, the uncinate process was incised and  removed anterior ethmoid areas were opened up with straight through cut  forceps.  The microdebrider was used to open up some  of the anterior and  posterior ethmoid region.  There was some thickened mucosa but minimal  polypoid disease.  Most of the more thickened mucosa was anteriorly within  the ethmoid on the left side.  The maxillary ostia was identified with  curved suction and enlarged with backbiting and straight through cut  forceps.  The maxillary sinus on the left side was relatively clear.  This  completed the left ethmoidectomy and maxillary ostial enlargement.  Using  the bipolar cautery submucosal cauterization was performed of the inferior  turbinates bilaterally.  Kennedy sinus packs were placed  within the ethmoid  regions bilaterally and hydrated with Xylocaine with epinephrine.  This  completed the procedure.  Janeann was awakened from anesthesia and transferred  to  recovery room postoperatively doing well.   DISPOSITION:  Tomoko is discharged home later this morning on Z-Pack for five  days as she has allergies to penicillin and Tylenol and Vicodin as needed  for pain.  Will have her follow up in my office in three days for recheck  and remove the [facility] sinus packs.       CEN/MEDQ  D:  09/18/2004  T:  09/18/2004  Job:  [id number]   cc:   Barry Dienes. Eloise Harman, M.D.  [address]  [place]  [place] [address]  Fax: [phone]

## 2011-03-29 NOTE — Op Note (Signed)
NAME:  ORIEL, OJO                       ACCOUNT NO.:  [id number]   MEDICAL RECORD NO.:  [id number]                   PATIENT TYPE:  AMB   LOCATION:  ENDO                                 FACILITY:  [facility]   PHYSICIAN:  Lina Sar, M.D. [facility]               DATE OF BIRTH:  08/22/1928   DATE OF PROCEDURE:  DATE OF DISCHARGE:                                 OPERATIVE REPORT   PROCEDURE:  Upper endoscopy.   INDICATIONS:  This 77 year old white female has a history of benign distal  esophageal stricture, which was dilated the last time in January of 2003,  using [facility] dilator.  She has had recurrence of the dysphagia for solids,  despite on being on Nexium 40 mg a day.  Most recently, she switched to H2  receptor antagonists because of the expense with the PPIs.  She is  undergoing upper endoscopy and esophageal dilatation.  She has also a  history of iron deficiency anemia, which was evaluated in the past.  Her  colonoscopy was negative in 2001.   ENDOSCOPE:  Olympus single channel video endoscope.   SEDATION:  Versed 6 mg IV, fentanyl 62.5 mg IV.   FINDINGS:  Olympus single channel video endoscope passed under vision  through the posterior pharynx and into the esophagus.  The patient was  monitored by pulse oximeter.  Her oxygen saturations were normal.  She was  operative.  The proximal and midesophageal mucosa was unremarkable. There  was a mild esophageal stricture of the distal esophagus at 35 cm from the  incisors.  The diameter was about 14 mm, which allowed the endoscope to  traverse into the stomach without difficulty.  There was a small  nonreducible hiatal hernia which was not clinically significant.   STOMACH:  Stomach was insufflated with air, showed normal rugal folds and  body of the stomach.  There were multiple shallow pinpoint erosions  throughout the gastric antrum, some of them covered with exudate.  At least  ten of them were noted.  Previous CLOtest was  negative.   DUODENUM:  The duodenal bulb and descending duodenum was normal.  A guide  wire was then placed, endoscopic, into the stomach, and the scope was  retracted and Savary dilators passed over the guide wire without  fluoroscopic guidance.  Using 14 mm, 16 mm, and 17 mm dilators, there was  some blood on the last dilator.  The patient tolerated procedure well.   IMPRESSION:  1. Benign distal esophageal stricture, status post dilation to 17 mm.  2. Antral gastritis.   PLAN:  1. Continue Nexium, which was given to her as samples. The patient will     continue on the samples until she runs     out of them and then she will ask for more samples.  2. Avoid aspirin or any NSAIDs.  3. Antireflux measures.  Lina Sar, M.D. [facility]    DB/MEDQ  D:  08/23/2003  T:  08/23/2003  Job:  [id number]

## 2011-03-29 NOTE — Assessment & Plan Note (Signed)
[facility]                           GASTROENTEROLOGY OFFICE NOTE   NAME:Melanie Levy, Melanie Levy                    MRN:          [id number]  DATE:09/24/2006                            DOB:          [birth year]    Melanie Levy is a 77 year old white female whom we have followed for  gastroesophageal reflux disease, benign distal esophageal stricture, last  dilatation in November 2003.  She also has a history of partial small-bowel  obstruction and irritable bowel syndrome.  She had a gastric antral ulcer in  1994 which has been treated with chronic acid suppressing agent. She called  about a month ago with recurrence of epigastric pain and dysphagia.  We have  doubled up her Prilosec to 20 mg twice a day.  She also found some Protonix  at home which she has been taking with great relief of her substernal  discomfort and heartburn. She still comes back today with complaints of  intermittent solid food dysphagia.   PAST HISTORY:  Significant for a cholecystectomy.  Her last colonoscopy was  in October 2001.   MEDICATIONS:  1. Levothyroxine 25 mcg daily.  2. Fluoxetine 20 mg b.i.d.  3. Caduet 5/10 one p.o. daily.  4. Atenolol 25 mg p.o. daily.  5. Temazepam 30 mg daily.  6. Tylenol.  7. Protonix 40 mg daily.  8. Nebulizer.  9. Lactase.   PHYSICAL EXAMINATION:  Blood pressure 120/72, pulse 76, weight 136 pounds.  She was alert, oriented and in no distress.  LUNGS:  Clear to auscultation.  Normal S1-S2.  ABDOMEN:  Soft.  Decreased muscle tone, mildly protuberant, with hyperactive  bowel sounds, post cholecystectomy scar in the right upper quadrant.  Mild  discomfort in the subxiphoid and epigastric area.  No pulsations.  Liver  edge at the costal margin.  RECTAL EXAM:  Hemoccult negative stool.  EXTREMITIES:  No edema.   IMPRESSION:  36. A 77 year old black female with a recurrent solid food dysphagia and a      history of benign distal esophageal  stricture, and small hiatal hernia.      Her symptoms are consistent with a recurrent stricture.  2. History of small bowel obstruction, currently not a problem.  3. Diverticulosis by history.   PLAN:  1. Samples of Protonix 40 mg a day to take continuously as well as a new      prescription for Protonix with 1 refill.  2. Upper endoscopy with esophageal dilation scheduled for the next week.  3. Antireflux measures.     Hedwig Morton. Juanda Chance, MD  Electronically Signed    DMB/MedQ  DD: 09/24/2006  DT: 09/24/2006  Job #: [id number]   cc:   Barry Dienes. Eloise Harman, M.D.  Richard A. Alanda Amass, M.D.

## 2011-03-29 NOTE — H&P (Signed)
[place]. [facility]  Patient:    Melanie Levy, Melanie Levy                   MRN: [id number] Adm. Date:  12/27/00 Attending:  Marlan Palau, M.D. CC:         Richard A. Jacky Kindle, M.D.             Barry Dienes Eloise Harman, M.D.                         History and Physical  HISTORY OF PRESENT ILLNESS:  Melanie Levy is a 77 year old, right-handed white female, born on 08-25-1928, with a history of asthma and allergies.  This patient has been followed by Dr. Barry Dienes. Paterson/ Dr. Pearletha Furl. Jacky Kindle for the above problem, and really is on no prescription medications whatsoever at this point.  The patient gives a history of headaches "all her life," and comes to the hospital today with an event that is not well-described.  The patient apparently at around 1 p.m. today was cleaning in the back bedroom, when she came into the living room, stating that she could not see.  The patient also noted some tingling sensation in the left arm, and had a headache.  The patient either had a near syncopal or a syncopal event, was slightly confused at one point, lasting for several minutes.  The patient, however, recovered and was able to eat some soup for lunch.  The patient took an aspirin.  The patient initially did not want to come to the emergency room, but decided later on that this was the best thing to do.  The patient was brought in for an evaluation.  The patient had no observable weakness of the extremities, speech changes, or actual visual loss.  A CT scan of the brain showed basal ganglion calcification, calcification of the vertebral arteries, otherwise no acute changes noted.  Neurology was asked to see this patient for a further evaluation.  PAST MEDICAL HISTORY: 1. Significant for a history of asthma. 2. History of arthritis. 3. History of syncopal or near syncopal event today. 4. Status post tonsillectomy. 5. Status post appendectomy. 6. History of low  back surgery. 7. History of gallbladder surgery. 8. History of abdominal complaints, followed by Dr. Hedwig Morton. [place], with    questionable peptic ulcer disease.  SOCIAL HISTORY:  The patient does not smoke.  Drinks alcohol on occasion.  ALLERGIES:  PENICILLIN.  SOCIAL HISTORY:  The patient is married and lives in the [place] area. She has one daughter alive and well.  The patient has been working on and off part-time as a Lawyer.  REVIEW OF SYSTEMS:  Notable for frequent headaches.  The patient does give a prior history of episodes of blurred vision, last occurring with a headache two or three weeks ago.  The patient complains of her head "being stopped up." Does note some neck stiffness.  Denies chest pains.  Has chronic shortness of breath.  Denies any problems with staggering.  Denies prior blackouts or seizures.  PHYSICAL EXAMINATION:  VITAL SIGNS:  Blood pressure 158/68, heart rate 86, respirations 18, temperature afebrile.  GENERAL:  This patient is a fairly well-developed white female who is alert and cooperative at the time of the examination.  HEENT:  Head atraumatic.  Eyes:  Pupils equal, round, reactive to light. Discs are flat bilaterally.  NECK:  Supple, no carotid bruits noted.  LUNGS:  Clear to auscultation and percussion.  CARDIOVASCULAR:  A regular rate and rhythm, without obvious murmurs or rubs.  EXTREMITIES:  Without significant edema.  ABDOMEN:  Positive bowel sounds.  No organomegaly or tenderness is noted.  NEUROLOGIC:  The patient is alert, cooperative.  Good symmetry to pinprick sensation on the face is noted.  Normal speech pattern noted.  Good strength in the facial muscles and the muscles of head-turning and shoulder shrug bilaterally.  The patient has fairly normal strength on all four extremities. Good symmetric motor tone is noted throughout.  Sensory testing reveals a slight decrease in pinprick sensation on the left hand, as  compared to the right.  Otherwise good sensory is noted throughout.  Vibratory sensation is intact in all fours.  The patient has fair finger-to-nose-to-finger, and toe-to-finger bilaterally.  Deep tendon reflexes symmetric.  The toes are downgoing bilaterally.  No drift again is seen.  The patient was not ambulated.  LABORATORY DATA:  Pending at this time, including a chemistry profile, CBC, differential.  A CT scan of the head is as above.  No acute changes seen.  An electrocardiogram is pending.  Chest x-ray is pending.  IMPRESSION: 1. Episode of headache, blurred vision, near syncope, etiology unclear. 2. History of asthma/allergies.  DISPOSITION:  This patient has had an event that is of unclear etiology. Certainly do need to consider the possibility of a transient ischemic attack-type of event, possible vertebrobasilar insufficiency, but also need to rule out the possibility of migraine headache phenomenon.  The patient really has no objective deficits on the examination at this point.  The patient still is complaining of some headache.  Will perform a brief workup on this patient to rule out severe vertebral basilar insufficiency.  PLAN: 1. Admission to [facility]. 2. MRI of the brain. 3. MR angiogram. 4. Carotid Doppler study. 5. Consider a 2-D echocardiogram. 6. No heparin for now.  Will treat with aspirin one q.d. 7. Will follow the patients clinical course while in-house. DD:  12/27/00 TD:  12/27/00 Job: [id number]

## 2011-03-29 NOTE — H&P (Signed)
NAMEAUBRIE, Melanie Levy             ACCOUNT NO.:  [id number]   MEDICAL RECORD NO.:  [id number]          PATIENT TYPE:  OBV   LOCATION:  1831                         FACILITY:  [facility]   PHYSICIAN:  Barry Dienes. Eloise Harman, M.D.DATE OF BIRTH:  November 07, 1928   DATE OF ADMISSION:  01/11/2005  DATE OF DISCHARGE:  01/12/2005                                HISTORY & PHYSICAL   CHIEF COMPLAINT:  Status post accidental ingestion.   HISTORY OF PRESENT ILLNESS:  The patient is a 77 year old white female who  last evening sprinkled some Epsom salts on her ice cream, thinking it was  nuts. When she noticed the poor taste and her error, she presented to the  emergency room for evaluation. Overnight she has had mild diarrhea and has a  mild headache, which is common for her. She denies shortness of breath,  palpitations or chest pain. She also denies depression.   PAST MEDICAL HISTORY:  Chronic cough and asthma with a December 04, 2004  chest x-ray showing chronic changes of asthma with a small hiatal hernia,  September 2005 bilateral maxillary sinusitis, lumbar spine osteoarthritis,  large hiatal hernia with gastroesophageal reflux disease requiring a 2002  and October 2004 EGD with dilatation of stricture, chronic headaches with  2002 MRI exam normal, chronic mild anemia with hemoccult positivity and iron  deficiency and a 2001 colonoscopy unremarkable and 2004 EGD showing  gastritis, 2002 syncope with workup benign.   MEDICATIONS PRIOR TO ADMISSION:  1.  Albuterol nebulizers as needed.  2.  Atenolol one tablet daily.  3.  Pepcid one tablet twice daily.  4.  Prozac one tablet daily.  5.  Advair one inhalation his twice-daily.   ALLERGIES:  PENICILLIN.   PAST SURGICAL HISTORY:  Appendectomy, tonsillectomy, lumbar spine surgery,  open cholecystectomy and November 2005 functional endoscopic sinus surgery  for polyposis.   FAMILY HISTORY:  Noncontributory.   SOCIAL HISTORY:  She is married and has  one daughter. She denies tobacco use  and has small alcohol use.   REVIEW OF SYSTEMS:  She has a mild bifrontal headache and mild diarrhea. She  denies fever, vision change, chest pain, palpitations, shortness of breath,  nausea, weakness, depression or suicidal ideation.   PHYSICAL EXAM:  VITAL SIGNS: Blood pressure 130/90, pulse 70, respirations  18, temperature 97.8, pulse oxygen saturation 98% on room air.  GENERAL: She is a well-nourished, well-developed white female who is in no  apparent distress.  HEENT EXAM: Was within normal limits.  NECK: Neck was supple without jugular venous distension or carotid bruit.  CHEST: Chest was clear to auscultation.  HEART: Regular rate and rhythm. S1 and S2 were present without murmur,  gallop or rub. ABDOMEN: Abdomen had normal bowel sounds with no  hepatosplenomegaly or tenderness.  EXTREMITIES: Extremities were without edema.  NEUROLOGICAL EXAM: She is alert and oriented x3 with a normal affect,  cranial nerves II-XII were normal, motor strength was 5/5 throughout,  cerebellar testing showed intact finger-to-nose test bilaterally. Gait was  not assessed.   LABORATORY STUDIES:  White blood cell count 7.4, hemoglobin 11.2, hematocrit  33, platelets  325, serum sodium 139, potassium 4.1, chloride 110, CO2 25,  BUN 14, creatinine 1.0, glucose 92, total bilirubin 0.4, serum albumin 3.1,  alkaline phosphatase 80, SGOT 18, SGPT 13, troponin-I less than 0.05, serum  myoglobin 60, serum magnesium 2.6 (normal 1.5-2.5).  Twelve-lead EKG showed  sinus bradycardia (rate 59), otherwise normal EKG. Current telemetry shows  that she remains in normal sinus rhythm.   IMPRESSION AND PLAN:  1.  Magnesium sulfate ingestion: She appears to be quite stable now and is      greater than 12 hours following her ingestion. Her case was discussed      with Poison Control and a review of potential toxic side effects      indicates that her only side effect to date  has been her mild diarrhea.      I plan to recheck a serum magnesium level and if less than 2.6 she can      be discharged to home.  2.  Headache: This is mild and a chronic symptom for her with benign workup      in the past. I plan to administer Vicodin 5/500 one tab p.o. q.i.d.      p.r.n. pain with Senna-S one to two tabs daily p.r.n. constipation.  3.  Hypertension: Under reasonable control on her current medical regimen.      DGP/MEDQ  D:  01/12/2005  T:  01/12/2005  Job:  [id number]   cc:   Lina Sar, M.D. [facility]   Marlan Palau, M.D.  [address]  [address]  [place]  [place] [address]  Fax: [phone]   Kristine Garbe. Ezzard Standing, M.D.  [address]  [place] [address]  Fax: [phone]

## 2011-05-20 ENCOUNTER — Ambulatory Visit (HOSPITAL_COMMUNITY): Payer: Medicare Other

## 2011-05-22 ENCOUNTER — Encounter (HOSPITAL_COMMUNITY): Payer: Medicare Other | Attending: Internal Medicine

## 2011-05-22 DIAGNOSIS — D649 Anemia, unspecified: Secondary | ICD-10-CM | POA: Insufficient documentation

## 2011-05-30 ENCOUNTER — Encounter (HOSPITAL_COMMUNITY): Payer: Medicare Other

## 2011-06-17 ENCOUNTER — Telehealth: Payer: Self-pay | Admitting: Internal Medicine

## 2011-06-17 NOTE — Telephone Encounter (Signed)
Patient calling to report she had labs done at [facility] and was wondering if her daughter had called [place]. Told her we have not heard from her daughter. Patient states it maybe a few days before the results are back and then her daughter will call.

## 2011-07-22 ENCOUNTER — Encounter: Payer: Self-pay | Admitting: *Deleted

## 2011-07-29 ENCOUNTER — Ambulatory Visit: Payer: Medicare Other | Admitting: Internal Medicine

## 2011-08-20 ENCOUNTER — Other Ambulatory Visit: Payer: Self-pay | Admitting: Internal Medicine

## 2011-08-20 DIAGNOSIS — Z1231 Encounter for screening mammogram for malignant neoplasm of breast: Secondary | ICD-10-CM

## 2011-09-09 ENCOUNTER — Encounter: Payer: Self-pay | Admitting: Internal Medicine

## 2011-09-24 ENCOUNTER — Ambulatory Visit: Payer: Medicare Other

## 2011-10-07 ENCOUNTER — Ambulatory Visit (INDEPENDENT_AMBULATORY_CARE_PROVIDER_SITE_OTHER): Payer: Medicare Other | Admitting: Internal Medicine

## 2011-10-07 ENCOUNTER — Other Ambulatory Visit (INDEPENDENT_AMBULATORY_CARE_PROVIDER_SITE_OTHER): Payer: Medicare Other

## 2011-10-07 ENCOUNTER — Encounter: Payer: Self-pay | Admitting: Internal Medicine

## 2011-10-07 VITALS — BP 120/66 | HR 60 | Ht 64.0 in | Wt 127.0 lb

## 2011-10-07 DIAGNOSIS — D509 Iron deficiency anemia, unspecified: Secondary | ICD-10-CM

## 2011-10-07 LAB — CBC WITH DIFFERENTIAL/PLATELET
Basophils Absolute: 0.1 10*3/uL (ref 0.0–0.1)
Eosinophils Absolute: 0.3 10*3/uL (ref 0.0–0.7)
HCT: 33.9 % — ABNORMAL LOW (ref 36.0–46.0)
Lymphs Abs: 1.8 10*3/uL (ref 0.7–4.0)
MCHC: 33.2 g/dL (ref 30.0–36.0)
MCV: 107.8 fl — ABNORMAL HIGH (ref 78.0–100.0)
Monocytes Absolute: 0.5 10*3/uL (ref 0.1–1.0)
Monocytes Relative: 6.7 % (ref 3.0–12.0)
Neutro Abs: 4.9 10*3/uL (ref 1.4–7.7)
Platelets: 249 10*3/uL (ref 150.0–400.0)
RDW: 13.6 % (ref 11.5–14.6)

## 2011-10-07 NOTE — Patient Instructions (Addendum)
You will need to be scheduled for an endoscopy and colonoscopy. I have contacted your daughter, Waynetta Sandy to get back in touch with me to set up a time and date for instructions etc. Your physician has requested that you go to the basement for the following lab work before leaving today: CBC  Dr Jarome Matin

## 2011-10-07 NOTE — Progress Notes (Signed)
I have spoken to [place], patient's daughter and have scheduled a previsit and endoscopy/colonoscopy.

## 2011-10-07 NOTE — Progress Notes (Signed)
Melanie Levy [birth year] MRN [id number]    History of Present Illness:  This is an 77 year old white female with chronic GI blood loss and a history of iron deficiency anemia. She has a history of a small bowel obstruction due to adhesions in 2001. She was evaluated for GI blood loss in 2001 with a normal colonoscopy. She had a gastric ulcer on an upper endoscopy in 1994. An upper endoscopy in 09/2002 and November 2007 showed a mild esophageal stricture which was dilated with 16 mm dilators. She also had a 5 cm hiatal hernia. She currently denies any dysphagia to solids or liquids. She was found to have a hemoglobin of 6.3 in June 2012 and responded to an iron infusion. The initial iron saturation of 3% improved to 31% in August 2012. She still continues her iron. She has regular bowel habits and denies any visible blood per rectum.   Past Medical History  Diagnosis Date  . Diverticulosis   . Esophageal stricture   . Hiatal hernia   . Iron deficiency anemia   . Depression   . Anxiety   . Arthritis   . Asthma   . Gastric ulcer 1994  . Partial small bowel obstruction 1995  . IBS (irritable bowel syndrome)   . GERD (gastroesophageal reflux disease)   . Osteoporosis   . Insomnia   . Macular degeneration   . Seborrheic keratosis    Past Surgical History  Procedure Date  . Cholecystectomy   . Appendectomy   . Tonsillectomy   . Spine surgery   . Bunionectomy   . Sinus surgery with instatrak     reports that she has never smoked. She has never used smokeless tobacco. She reports that she drinks alcohol. She reports that she does not use illicit drugs. family history includes Heart disease in an unspecified family member; Macular degeneration in an unspecified family member; and Ovarian cancer in her mother. Allergies  Allergen Reactions  . Iohexol      Desc: Pt states that years ago she had a procedure that involved IV contrast and had throat swelling and sob. She was told never  to have IV contrast again.  Pt is a very poor historian and cannot remember the date or the procedure.   Marland Kitchen Penicillins         Review of Systems: Negative for dysphagia or odynophagia, negative for weight loss or abdominal pain  The remainder of the 10 point ROS is negative except as outlined in H&P   Physical Exam: General appearance  Well developed, in no distress. Eyes- non icteric. HEENT nontraumatic, normocephalic. Mouth no lesions, tongue papillated, no cheilosis. Neck supple without adenopathy, thyroid not enlarged, no carotid bruits, no JVD. Lungs Clear to auscultation bilaterally. Cor normal S1, normal S2, regular rhythm, no murmur,  quiet precordium. Abdomen: Soft nontender with normal active bowel sounds. Well-healed post cholecystectomy scar in right upper quadrant. Rectal: Soft Hemoccult negative stool. Extremities no pedal edema. Skin no lesions. Neurological alert and oriented x 3. Psychological normal mood and affect.  Assessment and Plan:  Problem #1 Recurrent iron deficiency anemia in the setting of Hemoccult-negative stool. Her prior workups in 2001 and 2003 were negative. Her GI blood loss may be related to a large hiatal hernia, cameron erosions. Her last colonoscopy in 2001 was normal but she should have a repeat colonoscopy to rule out colon cancer. A small bowel capsule endoscopy in 2008 was negative for AVMs. We will proceed with a colonoscopy as  well with upper endoscopy.   10/07/2011 Lina Sar

## 2011-10-08 ENCOUNTER — Telehealth: Payer: Self-pay | Admitting: Internal Medicine

## 2011-10-08 ENCOUNTER — Other Ambulatory Visit: Payer: Self-pay | Admitting: Internal Medicine

## 2011-10-08 DIAGNOSIS — D649 Anemia, unspecified: Secondary | ICD-10-CM

## 2011-10-08 NOTE — Telephone Encounter (Signed)
Spoke with Mr. Foucher and answered questions.

## 2011-10-08 NOTE — Progress Notes (Signed)
Per Dr Juanda Chance, okay to add B12 and folate at time of patient's scheduled endoscopy/colonoscopy since the lab is unable to add these 2 tests on to the labs she already had.

## 2011-10-17 ENCOUNTER — Ambulatory Visit (AMBULATORY_SURGERY_CENTER): Payer: Medicare Other

## 2011-10-17 ENCOUNTER — Telehealth: Payer: Self-pay

## 2011-10-17 ENCOUNTER — Encounter: Payer: Self-pay | Admitting: Internal Medicine

## 2011-10-17 VITALS — Ht 63.0 in | Wt 125.0 lb

## 2011-10-17 DIAGNOSIS — Z1211 Encounter for screening for malignant neoplasm of colon: Secondary | ICD-10-CM

## 2011-10-17 DIAGNOSIS — D649 Anemia, unspecified: Secondary | ICD-10-CM

## 2011-10-17 MED ORDER — PEG-KCL-NACL-NASULF-NA ASC-C 100 G PO SOLR: 1.0000 | Freq: Once | ORAL | Status: DC

## 2011-10-17 NOTE — Telephone Encounter (Signed)
Daughter, Waynetta Sandy wants MD to call her to discuss pt's condition.  She would like to confirm that you have Dr's Paterson's notes and that you are aware that pt did not take iron for one year.  If you agree, she would like to wait 60 days, redraw CBC with diff, then decide whether or not pt needs these procedures.  It has been 10 yrs for both egd and colon; she is iron deficient (11.2 up from 6.2 before iron infusion). Doristine Church

## 2011-10-18 NOTE — Telephone Encounter (Signed)
I have spoken to the daughter and we agreed for the pt to have EGD but not the colonoscopy. Please change the schedule to a 30 minute procedure. Thanks DB

## 2011-10-24 ENCOUNTER — Other Ambulatory Visit: Payer: Self-pay | Admitting: *Deleted

## 2011-10-24 ENCOUNTER — Ambulatory Visit (AMBULATORY_SURGERY_CENTER): Payer: Medicare Other | Admitting: Internal Medicine

## 2011-10-24 ENCOUNTER — Encounter: Payer: Self-pay | Admitting: Internal Medicine

## 2011-10-24 VITALS — BP 182/112 | HR 89 | Temp 97.8°F | Resp 20 | Ht 63.0 in | Wt 125.0 lb

## 2011-10-24 DIAGNOSIS — K2960 Other gastritis without bleeding: Secondary | ICD-10-CM

## 2011-10-24 DIAGNOSIS — D509 Iron deficiency anemia, unspecified: Secondary | ICD-10-CM

## 2011-10-24 DIAGNOSIS — D649 Anemia, unspecified: Secondary | ICD-10-CM

## 2011-10-24 DIAGNOSIS — D131 Benign neoplasm of stomach: Secondary | ICD-10-CM

## 2011-10-24 DIAGNOSIS — K317 Polyp of stomach and duodenum: Secondary | ICD-10-CM

## 2011-10-24 DIAGNOSIS — K222 Esophageal obstruction: Secondary | ICD-10-CM

## 2011-10-24 MED ORDER — SODIUM CHLORIDE 0.9 % IV SOLN: 500.0000 mL | INTRAVENOUS | Status: DC

## 2011-10-24 MED ORDER — OMEPRAZOLE 20 MG PO CPDR: 20.0000 mg | DELAYED_RELEASE_CAPSULE | Freq: Every day | ORAL | Status: DC

## 2011-10-24 NOTE — Patient Instructions (Signed)
Discharge instructions given with verbal understanding. Handouts on hiatal hernia and a soft diet given. Resume previous medications. 

## 2011-10-24 NOTE — Op Note (Signed)
[facility] [address] [place], [place]  [address]  ENDOSCOPY PROCEDURE REPORT  PATIENT:  Melanie, Levy  MR#:  [id number] BIRTHDATE:  1928-04-25, 83 yrs. old  GENDER:  female  ENDOSCOPIST:  Hedwig Morton. Juanda Chance, MD Referred by:  Jarome Matin, M.D.  PROCEDURE DATE:  10/24/2011 PROCEDURE:  EGD with biopsy, [id number] ASA CLASS:  Class III INDICATIONS:  iron deficiency anemia hx hiatal hernia, heme negative stool, Hgb dropped to 6.2 in June 2012, respoded to Iron, current Hgb 11.6  MEDICATIONS:   These medications were titrated to patient response per physician's verbal order, Versed 4 mg, Fentanyl 50 mcg TOPICAL ANESTHETIC:  Cetacaine Spray  DESCRIPTION OF PROCEDURE:   After the risks benefits and alternatives of the procedure were thoroughly explained, informed consent was obtained.  The LB GIF-H180 [id number] endoscope was introduced through the mouth and advanced to the second portion of the duodenum, without limitations.  The instrument was slowly withdrawn as the mucosa was fully examined. <<PROCEDUREIMAGES>>  A hiatal hernia was found (see image1, image2, image3, image4, image7, and image8). large nonreducible hiatal hernia- 32-39 cm with multiple Cameron erosions  A stricture was found in the distal esophagus. nonobstructing es (see image10). stricture, not dilated  A sessile polyp was found. fundic gland polyp in the hiatal hernia With standard forceps, a biopsy was obtained and sent to pathology (see image9).  Otherwise the examination was normal (see image11, image6, and image5).  Moderate gastritis was found. Cameron erosions in the hiatal hernia    Retroflexed views revealed no abnormalities.    The scope was then withdrawn from the patient and the procedure completed.  COMPLICATIONS:  None  ENDOSCOPIC IMPRESSION: 1) Hiatal hernia 2) Stricture in the distal esophagus 3) Sessile polyp 4) Otherwise normal examination blood loss likely secondary to  [facility] erosions which cause slow GIB RECOMMENDATIONS: 1) Await biopsy results 2) Anti-reflux regimen to be follow continie Iron supplements indefinitely cont Prilosec 20 mg qd check CBC frequently, al least q 3 months  REPEAT EXAM:  In 0 year(s) for.  ______________________________ Hedwig Morton. Juanda Chance, MD  CC:  n. eSIGNED:   Hedwig Morton. Luvina Poirier at 10/24/2011 11:33 AM  Zebedee Iba, [id number]
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
## 2011-10-25 ENCOUNTER — Telehealth: Payer: Self-pay | Admitting: *Deleted

## 2011-10-25 NOTE — Telephone Encounter (Signed)

## 2011-10-30 ENCOUNTER — Encounter: Payer: Self-pay | Admitting: Internal Medicine

## 2011-11-21 ENCOUNTER — Ambulatory Visit
Admission: RE | Admit: 2011-11-21 | Discharge: 2011-11-21 | Disposition: A | Discharge status: Home / Self Care | Payer: Medicare Other | Source: Ambulatory Visit | Attending: Internal Medicine | Admitting: Internal Medicine

## 2011-11-21 DIAGNOSIS — Z1231 Encounter for screening mammogram for malignant neoplasm of breast: Secondary | ICD-10-CM

## 2012-01-20 ENCOUNTER — Telehealth: Payer: Self-pay | Admitting: *Deleted

## 2012-01-20 NOTE — Telephone Encounter (Signed)
Message copied by Melanie Levy on Mon Jan 20, 2012 11:28 AM ------      Message from: Melanie Levy      Created: Thu Oct 24, 2011  2:52 PM       Call and remind patient due for CBC 01/20/12(DB)

## 2012-01-20 NOTE — Telephone Encounter (Signed)
Spoke with patient and she will come for labs. 

## 2012-01-30 ENCOUNTER — Telehealth: Payer: Self-pay | Admitting: *Deleted

## 2012-01-30 NOTE — Telephone Encounter (Signed)
Spoke with patient and she states she had the flu and has not gotten her labs done but states she will come next week.

## 2012-01-30 NOTE — Telephone Encounter (Signed)
Message copied by Daphine Deutscher on Thu Jan 30, 2012  2:02 PM ------      Message from: Daphine Deutscher      Created: Mon Jan 20, 2012 11:31 AM       Did patient have cbc for DB

## 2012-02-17 ENCOUNTER — Other Ambulatory Visit (INDEPENDENT_AMBULATORY_CARE_PROVIDER_SITE_OTHER): Payer: Medicare Other

## 2012-02-17 DIAGNOSIS — E538 Deficiency of other specified B group vitamins: Secondary | ICD-10-CM

## 2012-02-17 DIAGNOSIS — D649 Anemia, unspecified: Secondary | ICD-10-CM

## 2012-02-17 LAB — VITAMIN B12: Vitamin B-12: 242 pg/mL (ref 211–911)

## 2012-02-17 LAB — CBC WITH DIFFERENTIAL/PLATELET
Basophils Absolute: 0 10*3/uL (ref 0.0–0.1)
Basophils Relative: 0.7 % (ref 0.0–3.0)
Eosinophils Absolute: 0.3 10*3/uL (ref 0.0–0.7)
MCHC: 32.8 g/dL (ref 30.0–36.0)
MCV: 104.3 fl — ABNORMAL HIGH (ref 78.0–100.0)
Monocytes Absolute: 0.4 10*3/uL (ref 0.1–1.0)
Neutrophils Relative %: 67.9 % (ref 43.0–77.0)
Platelets: 247 10*3/uL (ref 150.0–400.0)
RDW: 12.7 % (ref 11.5–14.6)

## 2012-03-03 ENCOUNTER — Telehealth: Payer: Self-pay | Admitting: *Deleted

## 2012-03-03 NOTE — Telephone Encounter (Signed)
Spoke with patient and she is getting her B 12 at Her PCP.

## 2012-11-20 ENCOUNTER — Other Ambulatory Visit: Payer: Self-pay | Admitting: Internal Medicine

## 2012-11-20 DIAGNOSIS — Z1231 Encounter for screening mammogram for malignant neoplasm of breast: Secondary | ICD-10-CM

## 2012-12-18 ENCOUNTER — Ambulatory Visit
Admission: RE | Admit: 2012-12-18 | Discharge: 2012-12-18 | Disposition: A | Discharge status: Home / Self Care | Payer: Medicare Other | Source: Ambulatory Visit | Attending: Internal Medicine | Admitting: Internal Medicine

## 2012-12-18 DIAGNOSIS — Z1231 Encounter for screening mammogram for malignant neoplasm of breast: Secondary | ICD-10-CM

## 2013-02-07 ENCOUNTER — Encounter (HOSPITAL_COMMUNITY): Payer: Self-pay | Admitting: Emergency Medicine

## 2013-02-07 ENCOUNTER — Emergency Department (HOSPITAL_COMMUNITY)
Admission: EM | Admit: 2013-02-07 | Discharge: 2013-02-07 | Disposition: A | Discharge status: Home / Self Care | Payer: Medicare Other | Attending: Emergency Medicine | Admitting: Emergency Medicine

## 2013-02-07 ENCOUNTER — Emergency Department (HOSPITAL_COMMUNITY): Payer: Medicare Other

## 2013-02-07 DIAGNOSIS — F411 Generalized anxiety disorder: Secondary | ICD-10-CM | POA: Insufficient documentation

## 2013-02-07 DIAGNOSIS — S59909A Unspecified injury of unspecified elbow, initial encounter: Secondary | ICD-10-CM | POA: Insufficient documentation

## 2013-02-07 DIAGNOSIS — S6990XA Unspecified injury of unspecified wrist, hand and finger(s), initial encounter: Secondary | ICD-10-CM | POA: Insufficient documentation

## 2013-02-07 DIAGNOSIS — Z862 Personal history of diseases of the blood and blood-forming organs and certain disorders involving the immune mechanism: Secondary | ICD-10-CM | POA: Insufficient documentation

## 2013-02-07 DIAGNOSIS — T148XXA Other injury of unspecified body region, initial encounter: Secondary | ICD-10-CM

## 2013-02-07 DIAGNOSIS — Z8639 Personal history of other endocrine, nutritional and metabolic disease: Secondary | ICD-10-CM | POA: Insufficient documentation

## 2013-02-07 DIAGNOSIS — K219 Gastro-esophageal reflux disease without esophagitis: Secondary | ICD-10-CM | POA: Insufficient documentation

## 2013-02-07 DIAGNOSIS — F329 Major depressive disorder, single episode, unspecified: Secondary | ICD-10-CM | POA: Insufficient documentation

## 2013-02-07 DIAGNOSIS — J45909 Unspecified asthma, uncomplicated: Secondary | ICD-10-CM | POA: Insufficient documentation

## 2013-02-07 DIAGNOSIS — Z8739 Personal history of other diseases of the musculoskeletal system and connective tissue: Secondary | ICD-10-CM | POA: Insufficient documentation

## 2013-02-07 DIAGNOSIS — IMO0002 Reserved for concepts with insufficient information to code with codable children: Secondary | ICD-10-CM | POA: Insufficient documentation

## 2013-02-07 DIAGNOSIS — Y929 Unspecified place or not applicable: Secondary | ICD-10-CM | POA: Insufficient documentation

## 2013-02-07 DIAGNOSIS — D509 Iron deficiency anemia, unspecified: Secondary | ICD-10-CM | POA: Insufficient documentation

## 2013-02-07 DIAGNOSIS — W19XXXA Unspecified fall, initial encounter: Secondary | ICD-10-CM

## 2013-02-07 DIAGNOSIS — Z8669 Personal history of other diseases of the nervous system and sense organs: Secondary | ICD-10-CM | POA: Insufficient documentation

## 2013-02-07 DIAGNOSIS — F3289 Other specified depressive episodes: Secondary | ICD-10-CM | POA: Insufficient documentation

## 2013-02-07 DIAGNOSIS — S298XXA Other specified injuries of thorax, initial encounter: Secondary | ICD-10-CM | POA: Insufficient documentation

## 2013-02-07 DIAGNOSIS — Z8673 Personal history of transient ischemic attack (TIA), and cerebral infarction without residual deficits: Secondary | ICD-10-CM | POA: Insufficient documentation

## 2013-02-07 DIAGNOSIS — S61409A Unspecified open wound of unspecified hand, initial encounter: Secondary | ICD-10-CM | POA: Insufficient documentation

## 2013-02-07 DIAGNOSIS — Y9301 Activity, walking, marching and hiking: Secondary | ICD-10-CM | POA: Insufficient documentation

## 2013-02-07 DIAGNOSIS — Z8719 Personal history of other diseases of the digestive system: Secondary | ICD-10-CM | POA: Insufficient documentation

## 2013-02-07 DIAGNOSIS — Z79899 Other long term (current) drug therapy: Secondary | ICD-10-CM | POA: Insufficient documentation

## 2013-02-07 HISTORY — DX: Cerebral infarction, unspecified: I63.9

## 2013-02-07 MED ORDER — ACETAMINOPHEN 325 MG PO TABS
650.0000 mg | ORAL_TABLET | Freq: Once | ORAL | Status: AC
  Administered 2013-02-07: 650 mg via ORAL
  Filled 2013-02-07: qty 2
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
## 2013-02-07 NOTE — ED Provider Notes (Signed)
  Physical Exam  BP 176/74  Pulse 82  Temp(Src) 98.7 F (37.1 C) (Oral)  Resp 16  SpO2 97%  Physical Exam I was asked to check x-rays, which are normal.  Patient was informed of, also been asked to come upon her.  Skin tears, which I will do ED Course  LACERATION REPAIR Date/Time: 02/07/2013 9:26 PM Performed by: Arman Filter Authorized by: Arman Filter Consent: Verbal consent obtained. Risks and benefits: risks, benefits and alternatives were discussed Consent given by: patient Patient identity confirmed: verbally with patient Body area: upper extremity Location details: right hand Laceration length: 3 cm Foreign bodies: unknown Tendon involvement: none Nerve involvement: none Vascular damage: no Anesthetic total: 0 ml Patient sedated: no Irrigation solution: saline Debridement: none Degree of undermining: none Skin closure: glue Dressing: gauze roll Patient tolerance: Patient tolerated the procedure well with no immediate complications. Comments: Second skin tear R hand drmabonded as well length 3 cm     MDM Skin tears.  Dermabond it.  X-rays, reviewed negative for fracture.  Patient is unsure of her tetanus status.  She will call her primary care physician tomorrow to check on this      Arman Filter, NP 02/07/13 2128  Arman Filter, NP 02/07/13 2132
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
## 2013-02-07 NOTE — ED Notes (Signed)
Patient transported to CT 

## 2013-02-09 NOTE — ED Provider Notes (Signed)
Medical screening examination/treatment/procedure(s) were conducted as a shared visit with non-physician practitioner(s) and myself.  I personally evaluated the patient during the encounter   Lyanne Co, MD 02/09/13 [phone]

## 2013-08-24 ENCOUNTER — Emergency Department (HOSPITAL_COMMUNITY): Payer: Medicare Other

## 2013-08-24 ENCOUNTER — Inpatient Hospital Stay (HOSPITAL_COMMUNITY)
Admission: EM | Admit: 2013-08-24 | Discharge: 2013-08-27 | DRG: 812 | Disposition: A | Discharge status: Skilled Nursing Facility | Payer: Medicare Other | Attending: Internal Medicine | Admitting: Internal Medicine

## 2013-08-24 ENCOUNTER — Encounter (HOSPITAL_COMMUNITY): Payer: Self-pay | Admitting: Emergency Medicine

## 2013-08-24 DIAGNOSIS — S41109A Unspecified open wound of unspecified upper arm, initial encounter: Secondary | ICD-10-CM | POA: Diagnosis present

## 2013-08-24 DIAGNOSIS — K5900 Constipation, unspecified: Secondary | ICD-10-CM | POA: Diagnosis present

## 2013-08-24 DIAGNOSIS — F329 Major depressive disorder, single episode, unspecified: Secondary | ICD-10-CM | POA: Diagnosis present

## 2013-08-24 DIAGNOSIS — M810 Age-related osteoporosis without current pathological fracture: Secondary | ICD-10-CM | POA: Diagnosis present

## 2013-08-24 DIAGNOSIS — W19XXXA Unspecified fall, initial encounter: Secondary | ICD-10-CM

## 2013-08-24 DIAGNOSIS — K5730 Diverticulosis of large intestine without perforation or abscess without bleeding: Secondary | ICD-10-CM

## 2013-08-24 DIAGNOSIS — R269 Unspecified abnormalities of gait and mobility: Secondary | ICD-10-CM | POA: Diagnosis present

## 2013-08-24 DIAGNOSIS — I359 Nonrheumatic aortic valve disorder, unspecified: Secondary | ICD-10-CM | POA: Diagnosis present

## 2013-08-24 DIAGNOSIS — D509 Iron deficiency anemia, unspecified: Secondary | ICD-10-CM

## 2013-08-24 DIAGNOSIS — D649 Anemia, unspecified: Secondary | ICD-10-CM

## 2013-08-24 DIAGNOSIS — E039 Hypothyroidism, unspecified: Secondary | ICD-10-CM | POA: Diagnosis present

## 2013-08-24 DIAGNOSIS — F411 Generalized anxiety disorder: Secondary | ICD-10-CM | POA: Diagnosis present

## 2013-08-24 DIAGNOSIS — Z9181 History of falling: Secondary | ICD-10-CM

## 2013-08-24 DIAGNOSIS — Y92009 Unspecified place in unspecified non-institutional (private) residence as the place of occurrence of the external cause: Secondary | ICD-10-CM

## 2013-08-24 DIAGNOSIS — IMO0002 Reserved for concepts with insufficient information to code with codable children: Secondary | ICD-10-CM

## 2013-08-24 DIAGNOSIS — R627 Adult failure to thrive: Secondary | ICD-10-CM | POA: Diagnosis present

## 2013-08-24 DIAGNOSIS — K449 Diaphragmatic hernia without obstruction or gangrene: Secondary | ICD-10-CM

## 2013-08-24 DIAGNOSIS — R5381 Other malaise: Secondary | ICD-10-CM | POA: Diagnosis present

## 2013-08-24 DIAGNOSIS — Z79899 Other long term (current) drug therapy: Secondary | ICD-10-CM

## 2013-08-24 DIAGNOSIS — F3289 Other specified depressive episodes: Secondary | ICD-10-CM | POA: Diagnosis present

## 2013-08-24 DIAGNOSIS — R2681 Unsteadiness on feet: Secondary | ICD-10-CM

## 2013-08-24 DIAGNOSIS — J45909 Unspecified asthma, uncomplicated: Secondary | ICD-10-CM

## 2013-08-24 DIAGNOSIS — E46 Unspecified protein-calorie malnutrition: Secondary | ICD-10-CM

## 2013-08-24 DIAGNOSIS — F341 Dysthymic disorder: Secondary | ICD-10-CM

## 2013-08-24 DIAGNOSIS — M129 Arthropathy, unspecified: Secondary | ICD-10-CM

## 2013-08-24 DIAGNOSIS — K219 Gastro-esophageal reflux disease without esophagitis: Secondary | ICD-10-CM | POA: Diagnosis present

## 2013-08-24 DIAGNOSIS — Z8673 Personal history of transient ischemic attack (TIA), and cerebral infarction without residual deficits: Secondary | ICD-10-CM

## 2013-08-24 DIAGNOSIS — H3530 Unspecified macular degeneration: Secondary | ICD-10-CM | POA: Diagnosis present

## 2013-08-24 DIAGNOSIS — W010XXA Fall on same level from slipping, tripping and stumbling without subsequent striking against object, initial encounter: Secondary | ICD-10-CM | POA: Diagnosis present

## 2013-08-24 DIAGNOSIS — E876 Hypokalemia: Secondary | ICD-10-CM

## 2013-08-24 DIAGNOSIS — D500 Iron deficiency anemia secondary to blood loss (chronic): Principal | ICD-10-CM | POA: Diagnosis present

## 2013-08-24 DIAGNOSIS — T148XXA Other injury of unspecified body region, initial encounter: Secondary | ICD-10-CM | POA: Diagnosis present

## 2013-08-24 DIAGNOSIS — G4700 Insomnia, unspecified: Secondary | ICD-10-CM | POA: Diagnosis present

## 2013-08-24 DIAGNOSIS — K222 Esophageal obstruction: Secondary | ICD-10-CM

## 2013-08-24 LAB — CBC
MCH: 26.4 pg (ref 26.0–34.0)
Platelets: 397 10*3/uL (ref 150–400)
RBC: 2.84 MIL/uL — ABNORMAL LOW (ref 3.87–5.11)

## 2013-08-24 LAB — BASIC METABOLIC PANEL
CO2: 22 mEq/L (ref 19–32)
Calcium: 8.2 mg/dL — ABNORMAL LOW (ref 8.4–10.5)
GFR calc non Af Amer: 46 mL/min — ABNORMAL LOW (ref >90)
Potassium: 3.6 mEq/L (ref 3.5–5.1)
Sodium: 138 mEq/L (ref 135–145)

## 2013-08-24 LAB — PREPARE RBC (CROSSMATCH)

## 2013-08-24 LAB — OCCULT BLOOD, POC DEVICE: Fecal Occult Bld: NEGATIVE

## 2013-08-24 MED ORDER — LORAZEPAM 0.5 MG PO TABS
0.5000 mg | ORAL_TABLET | Freq: Two times a day (BID) | ORAL | Status: DC
  Administered 2013-08-24 – 2013-08-27 (×6): 0.5 mg via ORAL
  Filled 2013-08-24 (×6): qty 1

## 2013-08-24 MED ORDER — POLYSACCHARIDE IRON COMPLEX 150 MG PO CAPS
150.0000 mg | ORAL_CAPSULE | Freq: Two times a day (BID) | ORAL | Status: DC
  Administered 2013-08-24 – 2013-08-27 (×6): 150 mg via ORAL
  Filled 2013-08-24 (×7): qty 1

## 2013-08-24 MED ORDER — FLUOXETINE HCL 20 MG PO CAPS
20.0000 mg | ORAL_CAPSULE | Freq: Every day | ORAL | Status: DC
  Administered 2013-08-24 – 2013-08-27 (×4): 20 mg via ORAL
  Filled 2013-08-24 (×4): qty 1

## 2013-08-24 MED ORDER — SODIUM CHLORIDE 0.9 % IV SOLN: INTRAVENOUS | Status: DC

## 2013-08-24 MED ORDER — ONDANSETRON HCL 4 MG/2ML IJ SOLN: 4.0000 mg | Freq: Four times a day (QID) | INTRAMUSCULAR | Status: DC | PRN

## 2013-08-24 MED ORDER — ACETAMINOPHEN 500 MG PO TABS
1000.0000 mg | ORAL_TABLET | Freq: Once | ORAL | Status: AC
  Administered 2013-08-24: 1000 mg via ORAL
  Filled 2013-08-24: qty 2

## 2013-08-24 MED ORDER — LEVOTHYROXINE SODIUM 25 MCG PO TABS
25.0000 ug | ORAL_TABLET | Freq: Every day | ORAL | Status: DC
  Administered 2013-08-25 – 2013-08-27 (×3): 25 ug via ORAL
  Filled 2013-08-24 (×5): qty 1

## 2013-08-24 MED ORDER — ACETAMINOPHEN 325 MG PO TABS
650.0000 mg | ORAL_TABLET | Freq: Four times a day (QID) | ORAL | Status: DC | PRN
  Administered 2013-08-25 (×2): 650 mg via ORAL
  Filled 2013-08-24 (×2): qty 2

## 2013-08-24 MED ORDER — TETANUS-DIPHTH-ACELL PERTUSSIS 5-2.5-18.5 LF-MCG/0.5 IM SUSP
0.5000 mL | Freq: Once | INTRAMUSCULAR | Status: AC
  Administered 2013-08-24: 0.5 mL via INTRAMUSCULAR
  Filled 2013-08-24: qty 0.5

## 2013-08-24 MED ORDER — MORPHINE SULFATE 4 MG/ML IJ SOLN
4.0000 mg | Freq: Once | INTRAMUSCULAR | Status: AC
  Administered 2013-08-24: 4 mg via INTRAVENOUS
  Filled 2013-08-24: qty 1

## 2013-08-24 MED ORDER — ACETAMINOPHEN 650 MG RE SUPP: 650.0000 mg | Freq: Four times a day (QID) | RECTAL | Status: DC | PRN

## 2013-08-24 MED ORDER — MUPIROCIN CALCIUM 2 % EX CREA
1.0000 "application " | TOPICAL_CREAM | Freq: Every day | CUTANEOUS | Status: DC
  Administered 2013-08-27: 1 via TOPICAL
  Filled 2013-08-24: qty 15

## 2013-08-24 MED ORDER — DOCUSATE SODIUM 100 MG PO CAPS
100.0000 mg | ORAL_CAPSULE | Freq: Two times a day (BID) | ORAL | Status: DC
  Administered 2013-08-24 – 2013-08-27 (×6): 100 mg via ORAL
  Filled 2013-08-24 (×5): qty 1

## 2013-08-24 MED ORDER — ONDANSETRON HCL 4 MG PO TABS: 4.0000 mg | ORAL_TABLET | Freq: Four times a day (QID) | ORAL | Status: DC | PRN

## 2013-08-24 MED ORDER — MOMETASONE FURO-FORMOTEROL FUM 100-5 MCG/ACT IN AERO
2.0000 | INHALATION_SPRAY | Freq: Two times a day (BID) | RESPIRATORY_TRACT | Status: DC
  Administered 2013-08-24 – 2013-08-27 (×6): 2 via RESPIRATORY_TRACT
  Filled 2013-08-24: qty 8.8

## 2013-08-24 MED ORDER — SIMVASTATIN 20 MG PO TABS
20.0000 mg | ORAL_TABLET | Freq: Every evening | ORAL | Status: DC
  Administered 2013-08-24 – 2013-08-27 (×4): 20 mg via ORAL
  Filled 2013-08-24 (×4): qty 1

## 2013-08-24 MED ORDER — ENOXAPARIN SODIUM 30 MG/0.3ML ~~LOC~~ SOLN
30.0000 mg | SUBCUTANEOUS | Status: DC
  Administered 2013-08-24: 30 mg via SUBCUTANEOUS
  Filled 2013-08-24 (×2): qty 0.3

## 2013-08-24 MED ORDER — TRAMADOL HCL 50 MG PO TABS
50.0000 mg | ORAL_TABLET | Freq: Four times a day (QID) | ORAL | Status: DC | PRN
  Administered 2013-08-24 – 2013-08-25 (×2): 50 mg via ORAL
  Filled 2013-08-24 (×3): qty 1

## 2013-08-24 MED ORDER — PANTOPRAZOLE SODIUM 40 MG PO TBEC
40.0000 mg | DELAYED_RELEASE_TABLET | Freq: Every day | ORAL | Status: DC
  Administered 2013-08-25 – 2013-08-27 (×3): 40 mg via ORAL
  Filled 2013-08-24 (×3): qty 1

## 2013-08-24 MED ORDER — MIRTAZAPINE 15 MG PO TABS
15.0000 mg | ORAL_TABLET | Freq: Every day | ORAL | Status: DC
  Administered 2013-08-24 – 2013-08-26 (×3): 15 mg via ORAL
  Filled 2013-08-24 (×4): qty 1

## 2013-08-24 NOTE — ED Notes (Signed)
Pt arrived from home by [facility] with c/o right upper arm and right lower arm skin tear after a fall in the bathroom. Pt denies hitting head or LOC. Stated that she tripped over the toilet. Pt has large skin tear to right upper arm, bleeding controlled. A&Ox 4. Denies pain anywhere else other than skin tears.

## 2013-08-24 NOTE — ED Provider Notes (Signed)
CSN: [id number]     Arrival date & time 08/24/13  1042 History   First MD Initiated Contact with Patient 08/24/13 1042     Chief Complaint  Patient presents with  . Fall  . Skin Problem   (Consider location/radiation/quality/duration/timing/severity/associated sxs/prior Treatment) Patient is a 79 y.o. female presenting with fall and arm injury.  Fall This is a new problem. The current episode started today. The problem occurs constantly. The problem has been unchanged. Associated symptoms include headaches. Pertinent negatives include no abdominal pain, chest pain, chills, congestion, coughing, fever, nausea, numbness, rash, sore throat, vomiting or weakness. Nothing aggravates the symptoms. She has tried nothing for the symptoms.  Arm Injury Location:  Arm Time since incident:  2 hours Injury: yes   Mechanism of injury: fall   Fall:    Fall occurred: in bathroom.   Height of fall:  Standing Arm location:  R arm Associated symptoms: no back pain and no fever     Past Medical History  Diagnosis Date  . Diverticulosis   . Esophageal stricture   . Hiatal hernia   . Iron deficiency anemia   . Depression   . Anxiety   . Arthritis   . Asthma   . Gastric ulcer 1994  . Partial small bowel obstruction 1995  . IBS (irritable bowel syndrome)   . GERD (gastroesophageal reflux disease)   . Osteoporosis   . Insomnia   . Macular degeneration   . Seborrheic keratosis   . Stroke    Past Surgical History  Procedure Laterality Date  . Cholecystectomy    . Appendectomy    . Tonsillectomy    . Spine surgery    . Bunionectomy    . Sinus surgery with instatrak     Family History  Problem Relation Age of Onset  . Ovarian cancer Mother   . Heart disease    . Macular degeneration    . Colon cancer Neg Hx    History  Substance Use Topics  . Smoking status: Never Smoker   . Smokeless tobacco: Never Used  . Alcohol Use: Yes     Comment: not often    OB History   Grav Para Term  Preterm Abortions TAB SAB Ect Mult Living                 Review of Systems  Constitutional: Negative for fever and chills.  HENT: Negative for congestion, rhinorrhea and sore throat.   Eyes: Negative for photophobia and visual disturbance.  Respiratory: Negative for cough and shortness of breath.   Cardiovascular: Negative for chest pain and leg swelling.  Gastrointestinal: Negative for nausea, vomiting, abdominal pain, diarrhea and constipation.  Endocrine: Negative for polyphagia and polyuria.  Genitourinary: Negative for dysuria, flank pain, vaginal bleeding, vaginal discharge and enuresis.  Musculoskeletal: Negative for back pain and gait problem.  Skin: Negative for color change and rash.  Neurological: Positive for headaches. Negative for dizziness, syncope, weakness, light-headedness and numbness.  Hematological: Negative for adenopathy. Does not bruise/bleed easily.  All other systems reviewed and are negative.    Allergies  Iohexol; Milk-related compounds; and Penicillins  Home Medications   Current Outpatient Rx  Name  Route  Sig  Dispense  Refill  . FLUoxetine (PROZAC) 20 MG capsule   Oral   Take 20 mg by mouth daily.         . Fluticasone-Salmeterol (ADVAIR) 100-50 MCG/DOSE AEPB   Inhalation   Inhale 1 puff into the lungs daily.         Marland Kitchen  iron polysaccharides (NIFEREX) 150 MG capsule   Oral   Take 150 mg by mouth 2 (two) times daily.           Marland Kitchen levothyroxine (SYNTHROID, LEVOTHROID) 25 MCG tablet   Oral   Take 25 mcg by mouth daily.           Marland Kitchen LORazepam (ATIVAN) 0.5 MG tablet   Oral   Take 0.5 mg by mouth 2 (two) times daily.          . meloxicam (MOBIC) 15 MG tablet   Oral   Take 15 mg by mouth daily.         . mirtazapine (REMERON) 15 MG tablet   Oral   Take 15 mg by mouth at bedtime.           . mupirocin cream (BACTROBAN) 2 %   Topical   Apply 1 application topically daily. To wound and cover with bandage.         Marland Kitchen  omeprazole (PRILOSEC) 20 MG capsule   Oral   Take 20 mg by mouth daily.         . simvastatin (ZOCOR) 20 MG tablet   Oral   Take 20 mg by mouth every evening.         . traMADol (ULTRAM) 50 MG tablet   Oral   Take 50 mg by mouth every 6 (six) hours as needed for pain.          BP 158/73  Pulse 78  Temp(Src) 98.2 F (36.8 C) (Oral)  Resp 18  SpO2 100% Physical Exam  Vitals reviewed. Constitutional: She is oriented to person, place, and time. She appears well-developed and well-nourished.  HENT:  Head: Normocephalic and atraumatic.  Right Ear: External ear normal.  Left Ear: External ear normal.  Eyes: Conjunctivae and EOM are normal. Pupils are equal, round, and reactive to light.  Neck: Normal range of motion. Neck supple.  Cardiovascular: Normal rate, regular rhythm, normal heart sounds and intact distal pulses.   Pulmonary/Chest: Effort normal and breath sounds normal.  Abdominal: Soft. Bowel sounds are normal. There is no tenderness.  Musculoskeletal: Normal range of motion.       Right upper arm: She exhibits tenderness.  Large R sided skin tear over humerus with smaller forearm skin tear and small hand skin tear.    Neurological: She is alert and oriented to person, place, and time.  Skin: Skin is warm and dry.    ED Course  Procedures (including critical care time) Labs Review Labs Reviewed  CBC - Abnormal; Notable for the following:    WBC 13.2 (*)    RBC 2.84 (*)    Hemoglobin 7.5 (*)    HCT 23.8 (*)    All other components within normal limits  BASIC METABOLIC PANEL - Abnormal; Notable for the following:    Calcium 8.2 (*)    GFR calc non Af Amer 46 (*)    GFR calc Af Amer 53 (*)    All other components within normal limits  OCCULT BLOOD, POC DEVICE  PREPARE RBC (CROSSMATCH)  TYPE AND SCREEN   Imaging Review Dg Chest 2 View  08/24/2013   CLINICAL DATA:  Fall. Esophageal stricture. Hiatal hernia.  EXAM: CHEST  2 VIEW  COMPARISON:  02/07/2013   FINDINGS: Retrocardiac density compatible with moderate-sized hiatal hernia. Upper normal heart size. Mild airway thickening, particularly notable on the right side.  Chronic density along the right lateral hemidiaphragm has been  shown on prior CT scans in 2007 to be fluid density, and based on stability is considered benign.  No pneumothorax observed. Trace blunting of both posterior costophrenic angles. Bony demineralization is present.  IMPRESSION: 1. Trace bilateral pleural effusions with blunting of the posterior costophrenic angles. 2. Mild airway thickening may reflect bronchitis or reactive airways disease. 3. Chronic cystic lesion along the right hemidiaphragm, considered benign. 4. Moderate-sized hiatal hernia. 5. Bony demineralization.   Electronically Signed   By: Herbie Baltimore M.D.   On: 08/24/2013 12:33   Dg Forearm Right  08/24/2013   *RADIOLOGY REPORT*  Clinical Data: Fall, laceration  RIGHT FOREARM - 2 VIEW  Comparison: 08/24/2013  Findings: Bones are osteopenic.  Degenerative arthritis at the wrist.  Normal alignment without acute fracture.  No definite soft tissue abnormality.  IMPRESSION: No acute osseous finding  Osteopenia and wrist osteoarthritis   Original Report Authenticated By: Judie Petit. Miles Costain, M.D.   Ct Head Wo Contrast  08/24/2013   CLINICAL DATA:  Fall.  EXAM: CT HEAD WITHOUT CONTRAST  CT CERVICAL SPINE WITHOUT CONTRAST  TECHNIQUE: Multidetector CT imaging of the head and cervical spine was performed following the standard protocol without intravenous contrast. Multiplanar CT image reconstructions of the cervical spine were also generated.  COMPARISON:  10/03/2009 MR brain. 10/02/2009 head CT. No comparison cervical spine CT.  FINDINGS: CT HEAD FINDINGS  No skull fracture or intracranial hemorrhage.  Atrophy without hydrocephalus.  Small vessel disease type changes without CT evidence of large acute infarct.  Vascular calcifications.  No intracranial mass lesion noted on this  unenhanced exam.  CT CERVICAL SPINE FINDINGS  No cervical spine fracture is noted. Evaluation slightly limited by dental artifact in the upper cervical spine region. If the patient has symptoms that are highly suggestive of osseous or soft tissue injury MR imaging can be performed for further delineation.  Cervical spondylotic changes with various degrees of spinal stenosis and foraminal narrowing.  Bilateral lung apical parenchymal changes greater on the left have an appearance most suggestive of scarring. Stability can't be confirmed on follow up.  Vascular calcifications.  IMPRESSION: CT head:  No skull fracture or intracranial hemorrhage.  CT cervical spine:  No cervical spine fracture detected. Please see above discussion.   Electronically Signed   By: Bridgett Larsson M.D.   On: 08/24/2013 12:50   Ct Cervical Spine Wo Contrast  08/24/2013   CLINICAL DATA:  Fall.  EXAM: CT HEAD WITHOUT CONTRAST  CT CERVICAL SPINE WITHOUT CONTRAST  TECHNIQUE: Multidetector CT imaging of the head and cervical spine was performed following the standard protocol without intravenous contrast. Multiplanar CT image reconstructions of the cervical spine were also generated.  COMPARISON:  10/03/2009 MR brain. 10/02/2009 head CT. No comparison cervical spine CT.  FINDINGS: CT HEAD FINDINGS  No skull fracture or intracranial hemorrhage.  Atrophy without hydrocephalus.  Small vessel disease type changes without CT evidence of large acute infarct.  Vascular calcifications.  No intracranial mass lesion noted on this unenhanced exam.  CT CERVICAL SPINE FINDINGS  No cervical spine fracture is noted. Evaluation slightly limited by dental artifact in the upper cervical spine region. If the patient has symptoms that are highly suggestive of osseous or soft tissue injury MR imaging can be performed for further delineation.  Cervical spondylotic changes with various degrees of spinal stenosis and foraminal narrowing.  Bilateral lung apical  parenchymal changes greater on the left have an appearance most suggestive of scarring. Stability can't be confirmed on follow up.  Vascular calcifications.  IMPRESSION: CT head:  No skull fracture or intracranial hemorrhage.  CT cervical spine:  No cervical spine fracture detected. Please see above discussion.   Electronically Signed   By: Bridgett Larsson M.D.   On: 08/24/2013 12:50   Dg Hand 2 View Right  08/24/2013   CLINICAL DATA:  Recent traumatic injury with pain  EXAM: RIGHT HAND - 2 VIEW  COMPARISON:  None.  FINDINGS: Generalized osteopenia is identified. Degenerative changes are noted in the interphalangeal joints as well as at the 1st carpometacarpal articulation and within the carpal row. No definitive fracture is seen.  IMPRESSION: Chronic changes without acute abnormality.   Electronically Signed   By: Alcide Clever M.D.   On: 08/24/2013 12:07   Dg Humerus Right  08/24/2013   CLINICAL DATA:  Fall.  Laceration along the arm.  EXAM: RIGHT HUMERUS - 2+ VIEW  COMPARISON:  None.  FINDINGS: No fracture or acute bony findings. No retained foreign body identified.  IMPRESSION: 1. No retained foreign body or acute bony findings.   Electronically Signed   By: Herbie Baltimore M.D.   On: 08/24/2013 12:20    EKG Interpretation   None       MDM   1. Skin tear   2. Fall, initial encounter   3. Anemia    79 y.o. female  with pertinent PMH of IBS, Anxiety, CVA presents with right arm skin tears after mechanical fall from standing onto toilet.  Pt denies antecedent symptoms, LOC, or hit to head.  On initial exam, pt did not endorse any other concerning factors.  Physical exam as above with skin tears throughout entirety of R arm, however otherwise without signs of trauma including extensive MSK exam.  No focal neuro deficits.  Obtained basic labwork which was above demonstrated anemia.  Hemoccult negative stool.  Likely chronic and secondary to lack of consumption of home iron (x2 weeks per patient),  however at this time do not feel pt appropriate to dc home as she cannot care for herself at this time.  Imaging as above unremarkable for acute fracture or pathology.  Wounds dressed.  Consulted [facility] for admission.    Labs and imaging as above reviewed by myself and attending,Dr. Jeraldine Loots, with whom case was discussed.   1. Skin tear   2. Fall, initial encounter   3. Anemia         Noel Gerold, MD 08/24/13 1504

## 2013-08-24 NOTE — H&P (Signed)
Melanie Levy is an 79 y.o. female.   PCP:   Garlan Fillers, MD   Chief Complaint:  Falls, AFTT, Weakness, lacerations, Symptomatic Anemia c Hbg 7.5   HPI: 32 F who lives at home c Husband has been falling frequently Lately.  After some recent falls she was offered an appt c Dr Eloise Harman for Monday but she declined.  We were called at the office today as daughter wanted FMLA papers signed, Home Health set up and she was trying to get services to her parents.  She also stated that her mom fell and was in the ED. The fall that Melanie Levy suffered resulted in right upper arm and right lower arm skin tear after a fall in the bathroom. Pt denies hitting head or LOC. Stated that she tripped over the toilet. Pt has large skin tear to right upper arm, bleeding controlled.  No Loss of Consciousness. No Sz, +HA.  W/Up was (-) in ED x the cuts, lacs and bruises and new Dx of Sxatic anemia.  hbg 7.5.  Heme (-).  No obvious recent loss of Blood.  Hbg > 11 in Feb in our office and 10.3-11.3 over 2013 as seen on EPIC.  She repots Weakness, AFTT, SOB, Leg cramps, hunger and constipation.  I was called for inpt admission.  T and cross ordered and she has started 2 units PRBC transfusion.  I am seeing her up on 6 N and she is upset, frustrated and when i brought up ALF she seems amenable.  In ED Tylenol, TdAP, and Morphine given    Past Medical History:  Past Medical History  Diagnosis Date  . Diverticulosis   . Esophageal stricture   . Hiatal hernia   . Iron deficiency anemia   . Depression   . Anxiety   . Arthritis   . Asthma   . Gastric ulcer 1994  . Partial small bowel obstruction 1995  . IBS (irritable bowel syndrome)   . GERD (gastroesophageal reflux disease)   . Osteoporosis   . Insomnia   . Macular degeneration   . Seborrheic keratosis   . Stroke     Past Surgical History  Procedure Laterality Date  . Cholecystectomy    . Appendectomy    . Tonsillectomy    . Spine surgery     . Bunionectomy    . Sinus surgery with instatrak        Allergies:   Allergies  Allergen Reactions  . Iohexol      Desc: Pt states that years ago she had a procedure that involved IV contrast and had throat swelling and sob. She was told never to have IV contrast again.  Pt is a very poor historian and cannot remember the date or the procedure.   . Milk-Related Compounds Nausea And Vomiting  . Penicillins Swelling    Tongue swelling     Medications: Prior to Admission medications   Medication Sig Start Date End Date Taking? Authorizing Provider  FLUoxetine (PROZAC) 20 MG capsule Take 20 mg by mouth daily.   Yes Historical Provider, MD  Fluticasone-Salmeterol (ADVAIR) 100-50 MCG/DOSE AEPB Inhale 1 puff into the lungs daily.   Yes Historical Provider, MD  iron polysaccharides (NIFEREX) 150 MG capsule Take 150 mg by mouth 2 (two) times daily.     Yes Historical Provider, MD  levothyroxine (SYNTHROID, LEVOTHROID) 25 MCG tablet Take 25 mcg by mouth daily.     Yes Historical Provider, MD  LORazepam (ATIVAN) 0.5 MG  tablet Take 0.5 mg by mouth 2 (two) times daily.    Yes Historical Provider, MD  meloxicam (MOBIC) 15 MG tablet Take 15 mg by mouth daily.   Yes Historical Provider, MD  mirtazapine (REMERON) 15 MG tablet Take 15 mg by mouth at bedtime.     Yes Historical Provider, MD  mupirocin cream (BACTROBAN) 2 % Apply 1 application topically daily. To wound and cover with bandage.   Yes Historical Provider, MD  omeprazole (PRILOSEC) 20 MG capsule Take 20 mg by mouth daily.   Yes Historical Provider, MD  simvastatin (ZOCOR) 20 MG tablet Take 20 mg by mouth every evening.   Yes Historical Provider, MD  traMADol (ULTRAM) 50 MG tablet Take 50 mg by mouth every 6 (six) hours as needed for pain.   Yes Historical Provider, MD     Medications Prior to Admission  Medication Dose Route Frequency Provider Last Rate Last Dose  . 0.9 %  sodium chloride infusion  500 mL Intravenous Continuous Hart Carwin, MD       Medications Prior to Admission  Medication Sig Dispense Refill  . FLUoxetine (PROZAC) 20 MG capsule Take 20 mg by mouth daily.      . Fluticasone-Salmeterol (ADVAIR) 100-50 MCG/DOSE AEPB Inhale 1 puff into the lungs daily.      . iron polysaccharides (NIFEREX) 150 MG capsule Take 150 mg by mouth 2 (two) times daily.        Marland Kitchen levothyroxine (SYNTHROID, LEVOTHROID) 25 MCG tablet Take 25 mcg by mouth daily.        Marland Kitchen LORazepam (ATIVAN) 0.5 MG tablet Take 0.5 mg by mouth 2 (two) times daily.       . meloxicam (MOBIC) 15 MG tablet Take 15 mg by mouth daily.      . mirtazapine (REMERON) 15 MG tablet Take 15 mg by mouth at bedtime.        . mupirocin cream (BACTROBAN) 2 % Apply 1 application topically daily. To wound and cover with bandage.      Marland Kitchen omeprazole (PRILOSEC) 20 MG capsule Take 20 mg by mouth daily.      . simvastatin (ZOCOR) 20 MG tablet Take 20 mg by mouth every evening.      . traMADol (ULTRAM) 50 MG tablet Take 50 mg by mouth every 6 (six) hours as needed for pain.         Social History:  reports that she has never smoked. She has never used smokeless tobacco. She reports that she drinks alcohol. She reports that she does not use illicit drugs.  Family History: Family History  Problem Relation Age of Onset  . Ovarian cancer Mother   . Heart disease    . Macular degeneration    . Colon cancer Neg Hx     Review of Systems:  Review of Systems - See HPI. Mild Ab discomfort. No Edema. No CP All Organ Sxs reviewed.  Physical Exam:  Blood pressure 165/65, pulse 73, temperature 97.9 F (36.6 C), temperature source Oral, resp. rate 18, height 5\' 4"  (1.626 m), weight 54.2 kg (119 lb 7.8 oz), SpO2 97.00%. Filed Vitals:   08/24/13 1544 08/24/13 1630 08/24/13 1730 08/24/13 1830  BP: 165/73 140/57 118/61 165/65  Pulse: 86 77 79 73  Temp: 97.9 F (36.6 C) 97.9 F (36.6 C) 97.9 F (36.6 C) 97.9 F (36.6 C)  TempSrc: Oral Oral Oral Oral  Resp: 18 20 20 18    Height: 5\' 4"  (1.626 m)  Weight: 54.2 kg (119 lb 7.8 oz)     SpO2: 96% 98% 98% 97%   General appearance: A and O.  Face looks younger than expected. Head: Normocephalic, without obvious abnormality, atraumatic Eyes: conjunctivae/corneas clear. PERRL, EOM's intact.  Nose: Nares normal. Septum midline. Mucosa normal. No drainage or sinus tenderness. Throat: lips, mucosa, and tongue normal; teeth and gums normal Neck: no adenopathy, no carotid bruit, no JVD and thyroid not enlarged, symmetric, no tenderness/mass/nodules Resp: CTA B Cardio: Reg, No m GI: Mild-tender but soft and No Reb/Guarding; bowel sounds normal; no masses,  no organomegaly Extremities: R Arm Wrapped.  Lacerations/Abraisions. Tender.  No Edeam Pulses: 2+ and symmetric Lymph nodes: no cervical lymphadenopathy Neurologic: Alert and oriented X 3, normal strength and tone. Normal symmetric reflexes.     Labs on Admission:   Recent Labs  08/24/13 1240  NA 138  K 3.6  CL 102  CO2 22  GLUCOSE 96  BUN 13  CREATININE 1.09  CALCIUM 8.2*   No results found for this basename: AST, ALT, ALKPHOS, BILITOT, PROT, ALBUMIN,  in the last 72 hours No results found for this basename: LIPASE, AMYLASE,  in the last 72 hours  Recent Labs  08/24/13 1240  WBC 13.2*  HGB 7.5*  HCT 23.8*  MCV 83.8  PLT 397   No results found for this basename: CKTOTAL, CKMB, CKMBINDEX, TROPONINI,  in the last 72 hours No results found for this basename: INR,  PROTIME     LAB RESULT POCT:  Results for orders placed during the hospital encounter of 08/24/13  CBC      Result Value Range   WBC 13.2 (*) 4.0 - 10.5 K/uL   RBC 2.84 (*) 3.87 - 5.11 MIL/uL   Hemoglobin 7.5 (*) 12.0 - 15.0 g/dL   HCT 40.9 (*) 81.1 - 91.4 %   MCV 83.8  78.0 - 100.0 fL   MCH 26.4  26.0 - 34.0 pg   MCHC 31.5  30.0 - 36.0 g/dL   RDW 78.2  95.6 - 21.3 %   Platelets 397  150 - 400 K/uL  BASIC METABOLIC PANEL      Result Value Range   Sodium 138  135 - 145  mEq/L   Potassium 3.6  3.5 - 5.1 mEq/L   Chloride 102  96 - 112 mEq/L   CO2 22  19 - 32 mEq/L   Glucose, Bld 96  70 - 99 mg/dL   BUN 13  6 - 23 mg/dL   Creatinine, Ser 0.86  0.50 - 1.10 mg/dL   Calcium 8.2 (*) 8.4 - 10.5 mg/dL   GFR calc non Af Amer 46 (*) >90 mL/min   GFR calc Af Amer 53 (*) >90 mL/min  OCCULT BLOOD, POC DEVICE      Result Value Range   Fecal Occult Bld NEGATIVE  NEGATIVE  PREPARE RBC (CROSSMATCH)      Result Value Range   Order Confirmation ORDER PROCESSED BY BLOOD BANK    TYPE AND SCREEN      Result Value Range   ABO/RH(D) O NEG     Antibody Screen NEG     Sample Expiration 08/27/2013     Unit Number [id number]     Blood Component Type RBC LR PHER2     Unit division 00     Status of Unit ISSUED     Transfusion Status OK TO TRANSFUSE     Crossmatch Result Compatible    ABO/RH  Result Value Range   ABO/RH(D) O NEG        Radiological Exams on Admission: Dg Chest 2 View  08/24/2013   CLINICAL DATA:  Fall. Esophageal stricture. Hiatal hernia.  EXAM: CHEST  2 VIEW  COMPARISON:  02/07/2013  FINDINGS: Retrocardiac density compatible with moderate-sized hiatal hernia. Upper normal heart size. Mild airway thickening, particularly notable on the right side.  Chronic density along the right lateral hemidiaphragm has been shown on prior CT scans in 2007 to be fluid density, and based on stability is considered benign.  No pneumothorax observed. Trace blunting of both posterior costophrenic angles. Bony demineralization is present.  IMPRESSION: 1. Trace bilateral pleural effusions with blunting of the posterior costophrenic angles. 2. Mild airway thickening may reflect bronchitis or reactive airways disease. 3. Chronic cystic lesion along the right hemidiaphragm, considered benign. 4. Moderate-sized hiatal hernia. 5. Bony demineralization.   Electronically Signed   By: Herbie Baltimore M.D.   On: 08/24/2013 12:33   Dg Forearm Right  08/24/2013   *RADIOLOGY  REPORT*  Clinical Data: Fall, laceration  RIGHT FOREARM - 2 VIEW  Comparison: 08/24/2013  Findings: Bones are osteopenic.  Degenerative arthritis at the wrist.  Normal alignment without acute fracture.  No definite soft tissue abnormality.  IMPRESSION: No acute osseous finding  Osteopenia and wrist osteoarthritis   Original Report Authenticated By: Judie Petit. Miles Costain, M.D.   Ct Head Wo Contrast  08/24/2013   CLINICAL DATA:  Fall.  EXAM: CT HEAD WITHOUT CONTRAST  CT CERVICAL SPINE WITHOUT CONTRAST  TECHNIQUE: Multidetector CT imaging of the head and cervical spine was performed following the standard protocol without intravenous contrast. Multiplanar CT image reconstructions of the cervical spine were also generated.  COMPARISON:  10/03/2009 MR brain. 10/02/2009 head CT. No comparison cervical spine CT.  FINDINGS: CT HEAD FINDINGS  No skull fracture or intracranial hemorrhage.  Atrophy without hydrocephalus.  Small vessel disease type changes without CT evidence of large acute infarct.  Vascular calcifications.  No intracranial mass lesion noted on this unenhanced exam.  CT CERVICAL SPINE FINDINGS  No cervical spine fracture is noted. Evaluation slightly limited by dental artifact in the upper cervical spine region. If the patient has symptoms that are highly suggestive of osseous or soft tissue injury MR imaging can be performed for further delineation.  Cervical spondylotic changes with various degrees of spinal stenosis and foraminal narrowing.  Bilateral lung apical parenchymal changes greater on the left have an appearance most suggestive of scarring. Stability can't be confirmed on follow up.  Vascular calcifications.  IMPRESSION: CT head:  No skull fracture or intracranial hemorrhage.  CT cervical spine:  No cervical spine fracture detected. Please see above discussion.   Electronically Signed   By: Bridgett Larsson M.D.   On: 08/24/2013 12:50   Ct Cervical Spine Wo Contrast  08/24/2013   CLINICAL DATA:  Fall.   EXAM: CT HEAD WITHOUT CONTRAST  CT CERVICAL SPINE WITHOUT CONTRAST  TECHNIQUE: Multidetector CT imaging of the head and cervical spine was performed following the standard protocol without intravenous contrast. Multiplanar CT image reconstructions of the cervical spine were also generated.  COMPARISON:  10/03/2009 MR brain. 10/02/2009 head CT. No comparison cervical spine CT.  FINDINGS: CT HEAD FINDINGS  No skull fracture or intracranial hemorrhage.  Atrophy without hydrocephalus.  Small vessel disease type changes without CT evidence of large acute infarct.  Vascular calcifications.  No intracranial mass lesion noted on this unenhanced exam.  CT CERVICAL SPINE FINDINGS  No  cervical spine fracture is noted. Evaluation slightly limited by dental artifact in the upper cervical spine region. If the patient has symptoms that are highly suggestive of osseous or soft tissue injury MR imaging can be performed for further delineation.  Cervical spondylotic changes with various degrees of spinal stenosis and foraminal narrowing.  Bilateral lung apical parenchymal changes greater on the left have an appearance most suggestive of scarring. Stability can't be confirmed on follow up.  Vascular calcifications.  IMPRESSION: CT head:  No skull fracture or intracranial hemorrhage.  CT cervical spine:  No cervical spine fracture detected. Please see above discussion.   Electronically Signed   By: Bridgett Larsson M.D.   On: 08/24/2013 12:50   Dg Hand 2 View Right  08/24/2013   CLINICAL DATA:  Recent traumatic injury with pain  EXAM: RIGHT HAND - 2 VIEW  COMPARISON:  None.  FINDINGS: Generalized osteopenia is identified. Degenerative changes are noted in the interphalangeal joints as well as at the 1st carpometacarpal articulation and within the carpal row. No definitive fracture is seen.  IMPRESSION: Chronic changes without acute abnormality.   Electronically Signed   By: Alcide Clever M.D.   On: 08/24/2013 12:07   Dg Humerus  Right  08/24/2013   CLINICAL DATA:  Fall.  Laceration along the arm.  EXAM: RIGHT HUMERUS - 2+ VIEW  COMPARISON:  None.  FINDINGS: No fracture or acute bony findings. No retained foreign body identified.  IMPRESSION: 1. No retained foreign body or acute bony findings.   Electronically Signed   By: Herbie Baltimore M.D.   On: 08/24/2013 12:20      No orders found for this or any previous visit.   Assessment/Plan Principal Problem:   ANEMIA Active Problems:   IRON DEFICIENCY   DEPRESSION/ANXIETY   ASTHMA   HIATAL HERNIA  Sxatic Anemia - Heme (-).  Transfuse 2 units PRBCs and follow serial CBCs.  Start Iron.  She has had chronic Iron Def anemia and known HH.  Dr Juanda Chance did EGD 10/24/11 and found large nonreducible hiatal hernia, Stricture in the distal esophagus and Sessile polyp and was told to take Iron supplements indefinitely, cont Prilosec 20 mg qd, and to check check CBCs at least every 3 months.  Last Colon was 2001 and was nml.  I will leave it up to Dr Eloise Harman whether he will consult [facility] during this Admission for EGD or capsule Endoscopy?Marland Kitchen  She has known chronic GI blood loss.    H/O of a small bowel obstruction due to adhesions in 2001 - No Current recurrence.  H/O GERD/HH/gastric ulcer on an upper endoscopy in 1994.  Continue PPI  H/O Esoph Stricture 09/2006 S/P Dilitation - No current Dysphagia.  B12 Def. On prior labs - Recheck  Falls/Weakness/Deconditioning - PT/OT/CW/SW.  AFTT - May need SNF or ALF?  Abraisions/lacerations - local Care..Wound care consult  DVT Proph.   Hypocalcemia? - Check CMET in am to check Albumin to see if malnourished.  Depression/Anxiety on meds.  Hypothyroid - on meds - check levels.  D/C Mobic!  Srihith Aquilino M 08/24/2013, 6:34 PM

## 2013-08-24 NOTE — Progress Notes (Signed)
Patient admitted to the floor from Emergency Department.  Patient alert and oriented.  IV in place.  Patient oriented to unit and call bell within reach.  Vital signs stable.  Patient currently receiving 1 unit PRBC.  [facility] notified of admission and Dr. Timothy Lasso will see patient this evening.

## 2013-08-24 NOTE — ED Notes (Signed)
Floor unable to take report at this time.

## 2013-08-25 LAB — CBC
HCT: 28.2 % — ABNORMAL LOW (ref 36.0–46.0)
MCH: 26.8 pg (ref 26.0–34.0)
MCV: 82.9 fL (ref 78.0–100.0)
Platelets: 368 10*3/uL (ref 150–400)
RBC: 3.4 MIL/uL — ABNORMAL LOW (ref 3.87–5.11)
WBC: 7.5 10*3/uL (ref 4.0–10.5)

## 2013-08-25 LAB — COMPREHENSIVE METABOLIC PANEL
AST: 14 U/L (ref 0–37)
Alkaline Phosphatase: 78 U/L (ref 39–117)
BUN: 12 mg/dL (ref 6–23)
CO2: 23 mEq/L (ref 19–32)
Calcium: 8.2 mg/dL — ABNORMAL LOW (ref 8.4–10.5)
Chloride: 106 mEq/L (ref 96–112)
Creatinine, Ser: 1.01 mg/dL (ref 0.50–1.10)
GFR calc Af Amer: 58 mL/min — ABNORMAL LOW (ref >90)
GFR calc non Af Amer: 50 mL/min — ABNORMAL LOW (ref >90)
Glucose, Bld: 83 mg/dL (ref 70–99)
Total Bilirubin: 0.3 mg/dL (ref 0.3–1.2)

## 2013-08-25 LAB — IRON AND TIBC
Iron: 89 ug/dL (ref 42–135)
Saturation Ratios: 25 % (ref 20–55)
TIBC: 363 ug/dL (ref 250–470)

## 2013-08-25 LAB — TSH: TSH: 5.123 u[IU]/mL — ABNORMAL HIGH (ref 0.350–4.500)

## 2013-08-25 LAB — TYPE AND SCREEN
Antibody Screen: NEGATIVE
Unit division: 0

## 2013-08-25 LAB — VITAMIN B12: Vitamin B-12: 718 pg/mL (ref 211–911)

## 2013-08-25 MED ORDER — ENOXAPARIN SODIUM 40 MG/0.4ML ~~LOC~~ SOLN
40.0000 mg | Freq: Every day | SUBCUTANEOUS | Status: DC
  Administered 2013-08-25 – 2013-08-26 (×2): 40 mg via SUBCUTANEOUS
  Filled 2013-08-25 (×2): qty 0.4

## 2013-08-25 NOTE — Evaluation (Signed)
Physical Therapy Evaluation Patient Details Name: Melanie Levy MRN: [id number] DOB: August 17, 1928 Today's Date: 08/25/2013 Time: 7564-3329 PT Time Calculation (min): 11 min  PT Assessment / Plan / Recommendation History of Present Illness  pt rpesents with Anemia, recent falls and FTT.    Clinical Impression  Pt presents generally unsteady and dizzy with mobility.  Pt indicates that her husband is also in the hospital and it is unclear exactly how much family is available to A pt and her husband.  Pt would benefit from SNF as safest D/C plan at this time.  Will continue to follow.      PT Assessment  Patient needs continued PT services    Follow Up Recommendations  SNF    Does the patient have the potential to tolerate intense rehabilitation      Barriers to Discharge Decreased caregiver support pt indicates husband is now in hospital and unclear how much family can A pt and husband.      Equipment Recommendations  Rolling walker with 5" wheels    Recommendations for Other Services     Frequency Min 3X/week    Precautions / Restrictions Precautions Precautions: Fall Restrictions Weight Bearing Restrictions: No   Pertinent Vitals/Pain Indicates Bil UEs are "tender".        Mobility  Bed Mobility Bed Mobility: Supine to Sit;Sitting - Scoot to [place] of Bed;Sit to Supine Supine to Sit: 4: Min assist;With rails;HOB elevated Sitting - Scoot to Edge of Bed: 3: Mod assist Sit to Supine: 5: Supervision Details for Bed Mobility Assistance: A with bringing trunk up to sitting.   Transfers Transfers: Sit to Stand;Stand to Sit Sit to Stand: 4: Min assist;With upper extremity assist;From bed Stand to Sit: 4: Min assist;With upper extremity assist;To bed Details for Transfer Assistance: pt generally unsteady and uses LEs against bed for stability.   Ambulation/Gait Ambulation/Gait Assistance: Not tested (comment) Stairs: No Wheelchair Mobility Wheelchair Mobility: No     Exercises     PT Diagnosis: Difficulty walking;Generalized weakness  PT Problem List: Decreased strength;Decreased activity tolerance;Decreased balance;Decreased mobility;Decreased knowledge of use of DME;Decreased cognition;Pain PT Treatment Interventions: DME instruction;Gait training;Stair training;Functional mobility training;Therapeutic activities;Therapeutic exercise;Balance training;Cognitive remediation;Patient/family education     PT Goals(Current goals can be found in the care plan section) Acute Rehab PT Goals Patient Stated Goal: not stated PT Goal Formulation: With patient Time For Goal Achievement: 09/08/13 Potential to Achieve Goals: Good  Visit Information  Last PT Received On: 08/25/13 Assistance Needed: +1 History of Present Illness: pt rpesents with Anemia, recent falls and FTT.         Prior Functioning  Home Living Family/patient expects to be discharged to:: Private residence Living Arrangements: Spouse/significant other;Other (Comment) (husband pt reports he is going to hospital today) Available Help at Discharge: Family;Other (Comment) (daughter works) Type of Home: Other(Comment) (townhome) Home Access: Elevator Home Layout: Multi-level Home Equipment: None Prior Function Level of Independence: Needs assistance ADL's / Homemaking Assistance Needed: does not cook and someone helps with cleaning Communication Communication: No difficulties Dominant Hand: Right    Cognition  Cognition Arousal/Alertness: Awake/alert Behavior During Therapy: WFL for tasks assessed/performed Overall Cognitive Status: No family/caregiver present to determine baseline cognitive functioning Area of Impairment: Orientation Orientation Level: Disoriented to;Time;Situation    Extremity/Trunk Assessment Upper Extremity Assessment Upper Extremity Assessment: Defer to OT evaluation Lower Extremity Assessment Lower Extremity Assessment: Generalized weakness   Balance  Balance Balance Assessed: Yes Static Standing Balance Static Standing - Balance Support: Right upper extremity supported  Static Standing - Level of Assistance: 4: Min assist Static Standing - Comment/# of Minutes: pt c/o feeling dizzy in standing, no nystagmus noted.    End of Session PT - End of Session Equipment Utilized During Treatment: Gait belt Activity Tolerance: Patient limited by fatigue (Limited by dizziness) Patient left: in bed;with call bell/phone within reach;with bed alarm set Nurse Communication: Mobility status  GP     Sunny Schlein, [place] [phone] 08/25/2013, 3:15 PM

## 2013-08-25 NOTE — Progress Notes (Signed)
Subjective: Feels weak today, having occasional falls (stumblling) not associated with palpitations or chest discomfort, not having dysphagia or abdominal pain.  Objective: Vital signs in last 24 hours: Temp:  [97.9 F (36.6 C)-98.5 F (36.9 C)] 98.1 F (36.7 C) (10/15 0615) Pulse Rate:  [72-89] 72 (10/15 0615) Resp:  [14-20] 18 (10/15 0615) BP: (118-165)/(47-94) 135/56 mmHg (10/15 0615) SpO2:  [96 %-100 %] 98 % (10/15 0615) Weight:  [54.2 kg (119 lb 7.8 oz)] 54.2 kg (119 lb 7.8 oz) (10/14 1544) Weight change:    Intake/Output from previous day: 10/14 0701 - 10/15 0700 In: 311.5 [Blood:311.5] Out: -    General appearance: alert, cooperative and no distress Resp: clear to auscultation bilaterally Cardio: regularly irregular rhythm and with a systolic ejection murmur of 2/6 intensity GI: soft, non-tender; bowel sounds normal; no masses,  no organomegaly Extremities: skin tears right arm and forearm bandaged Neurologic: Grossly normal  Lab Results:  Recent Labs  08/24/13 1240 08/25/13 0500  WBC 13.2* 7.5  HGB 7.5* 9.1*  HCT 23.8* 28.2*  PLT 397 368   BMET  Recent Labs  08/24/13 1240 08/25/13 0500  NA 138 140  K 3.6 3.4*  CL 102 106  CO2 22 23  GLUCOSE 96 83  BUN 13 12  CREATININE 1.09 1.01  CALCIUM 8.2* 8.2*   CMET CMP     Component Value Date/Time   NA 140 08/25/2013 0500   K 3.4* 08/25/2013 0500   CL 106 08/25/2013 0500   CO2 23 08/25/2013 0500   GLUCOSE 83 08/25/2013 0500   BUN 12 08/25/2013 0500   CREATININE 1.01 08/25/2013 0500   CALCIUM 8.2* 08/25/2013 0500   PROT 6.0 08/25/2013 0500   ALBUMIN 3.3* 08/25/2013 0500   AST 14 08/25/2013 0500   ALT 7 08/25/2013 0500   ALKPHOS 78 08/25/2013 0500   BILITOT 0.3 08/25/2013 0500   GFRNONAA 50* 08/25/2013 0500   GFRAA 58* 08/25/2013 0500    CBG (last 3)  No results found for this basename: GLUCAP,  in the last 72 hours  INR RESULTS:   No results found for this basename: INR, PROTIME      Studies/Results: Dg Chest 2 View  08/24/2013   CLINICAL DATA:  Fall. Esophageal stricture. Hiatal hernia.  EXAM: CHEST  2 VIEW  COMPARISON:  02/07/2013  FINDINGS: Retrocardiac density compatible with moderate-sized hiatal hernia. Upper normal heart size. Mild airway thickening, particularly notable on the right side.  Chronic density along the right lateral hemidiaphragm has been shown on prior CT scans in 2007 to be fluid density, and based on stability is considered benign.  No pneumothorax observed. Trace blunting of both posterior costophrenic angles. Bony demineralization is present.  IMPRESSION: 1. Trace bilateral pleural effusions with blunting of the posterior costophrenic angles. 2. Mild airway thickening may reflect bronchitis or reactive airways disease. 3. Chronic cystic lesion along the right hemidiaphragm, considered benign. 4. Moderate-sized hiatal hernia. 5. Bony demineralization.   Electronically Signed   By: Herbie Baltimore M.D.   On: 08/24/2013 12:33   Dg Forearm Right  08/24/2013   *RADIOLOGY REPORT*  Clinical Data: Fall, laceration  RIGHT FOREARM - 2 VIEW  Comparison: 08/24/2013  Findings: Bones are osteopenic.  Degenerative arthritis at the wrist.  Normal alignment without acute fracture.  No definite soft tissue abnormality.  IMPRESSION: No acute osseous finding  Osteopenia and wrist osteoarthritis   Original Report Authenticated By: Judie Petit. Miles Costain, M.D.   Ct Head Wo Contrast  08/24/2013  CLINICAL DATA:  Fall.  EXAM: CT HEAD WITHOUT CONTRAST  CT CERVICAL SPINE WITHOUT CONTRAST  TECHNIQUE: Multidetector CT imaging of the head and cervical spine was performed following the standard protocol without intravenous contrast. Multiplanar CT image reconstructions of the cervical spine were also generated.  COMPARISON:  10/03/2009 MR brain. 10/02/2009 head CT. No comparison cervical spine CT.  FINDINGS: CT HEAD FINDINGS  No skull fracture or intracranial hemorrhage.  Atrophy without  hydrocephalus.  Small vessel disease type changes without CT evidence of large acute infarct.  Vascular calcifications.  No intracranial mass lesion noted on this unenhanced exam.  CT CERVICAL SPINE FINDINGS  No cervical spine fracture is noted. Evaluation slightly limited by dental artifact in the upper cervical spine region. If the patient has symptoms that are highly suggestive of osseous or soft tissue injury MR imaging can be performed for further delineation.  Cervical spondylotic changes with various degrees of spinal stenosis and foraminal narrowing.  Bilateral lung apical parenchymal changes greater on the left have an appearance most suggestive of scarring. Stability can't be confirmed on follow up.  Vascular calcifications.  IMPRESSION: CT head:  No skull fracture or intracranial hemorrhage.  CT cervical spine:  No cervical spine fracture detected. Please see above discussion.   Electronically Signed   By: Bridgett Larsson M.D.   On: 08/24/2013 12:50   Ct Cervical Spine Wo Contrast  08/24/2013   CLINICAL DATA:  Fall.  EXAM: CT HEAD WITHOUT CONTRAST  CT CERVICAL SPINE WITHOUT CONTRAST  TECHNIQUE: Multidetector CT imaging of the head and cervical spine was performed following the standard protocol without intravenous contrast. Multiplanar CT image reconstructions of the cervical spine were also generated.  COMPARISON:  10/03/2009 MR brain. 10/02/2009 head CT. No comparison cervical spine CT.  FINDINGS: CT HEAD FINDINGS  No skull fracture or intracranial hemorrhage.  Atrophy without hydrocephalus.  Small vessel disease type changes without CT evidence of large acute infarct.  Vascular calcifications.  No intracranial mass lesion noted on this unenhanced exam.  CT CERVICAL SPINE FINDINGS  No cervical spine fracture is noted. Evaluation slightly limited by dental artifact in the upper cervical spine region. If the patient has symptoms that are highly suggestive of osseous or soft tissue injury MR imaging can  be performed for further delineation.  Cervical spondylotic changes with various degrees of spinal stenosis and foraminal narrowing.  Bilateral lung apical parenchymal changes greater on the left have an appearance most suggestive of scarring. Stability can't be confirmed on follow up.  Vascular calcifications.  IMPRESSION: CT head:  No skull fracture or intracranial hemorrhage.  CT cervical spine:  No cervical spine fracture detected. Please see above discussion.   Electronically Signed   By: Bridgett Larsson M.D.   On: 08/24/2013 12:50   Dg Hand 2 View Right  08/24/2013   CLINICAL DATA:  Recent traumatic injury with pain  EXAM: RIGHT HAND - 2 VIEW  COMPARISON:  None.  FINDINGS: Generalized osteopenia is identified. Degenerative changes are noted in the interphalangeal joints as well as at the 1st carpometacarpal articulation and within the carpal row. No definitive fracture is seen.  IMPRESSION: Chronic changes without acute abnormality.   Electronically Signed   By: Alcide Clever M.D.   On: 08/24/2013 12:07   Dg Humerus Right  08/24/2013   CLINICAL DATA:  Fall.  Laceration along the arm.  EXAM: RIGHT HUMERUS - 2+ VIEW  COMPARISON:  None.  FINDINGS: No fracture or acute bony findings. No retained foreign  body identified.  IMPRESSION: 1. No retained foreign body or acute bony findings.   Electronically Signed   By: Herbie Baltimore M.D.   On: 08/24/2013 12:20    Medications: I have reviewed the patient's current medications.  Assessment/Plan: #1 Fatigue: likely due to moderately severe anemia. Hemoglobin improved after transfusion. Will check B12, folate, and SPEP on serum from the ER, continue to monitor CBC #2 Gait Instability:  Will have PT evaluation of gait, will check EKG and echo   LOS: 1 day   Fina Heizer G 08/25/2013, 8:11 AM

## 2013-08-25 NOTE — Evaluation (Addendum)
Occupational Therapy Evaluation Patient Details Name: Melanie Levy MRN: [id number] DOB: 05-15-1928 Today's Date: 08/25/2013 Time: 4098-1191 OT Time Calculation (min): 21 min  OT Assessment / Plan / Recommendation History of present illness pt presents with Anemia, recent falls and FTT.     Clinical Impression   Pt presents with below problem list. Pt independent with ADLs (did not cook and someone assisted with cleaning), PTA. Pt will benefit from acute OT to increase independence prior to d/c. Pt limited today due to dizziness and asked for nerve meds (anxious/nervous).     OT Assessment  Patient needs continued OT Services    Follow Up Recommendations  SNF;Supervision/Assistance - 24 hour    Barriers to Discharge      Equipment Recommendations  3 in 1 bedside comode    Recommendations for Other Services    Frequency  Min 2X/week    Precautions / Restrictions Precautions Precautions: Fall Restrictions Weight Bearing Restrictions: No   Pertinent Vitals/Pain Pain in both upper extremities. Nurse notified. BP 134/64 sitting EOB.     ADL  Eating/Feeding: Independent Where Assessed - Eating/Feeding: Edge of bed Grooming: Brushing hair;Set up;Supervision/safety Where Assessed - Grooming: Unsupported sitting Upper Body Bathing: Set up;Supervision/safety Where Assessed - Upper Body Bathing: Unsupported sitting Lower Body Bathing: Min guard Where Assessed - Lower Body Bathing: Supported sit to stand Where Assessed - Upper Body Dressing: Unsupported sitting Lower Body Dressing: Min guard Where Assessed - Lower Body Dressing: Supported sit to Pharmacist, hospital: Hydrographic surveyor Method: Sit to Barista: Set designer - Architect and Hygiene: Min guard Where Assessed - Engineer, mining and Hygiene: Standing Tub/Shower Transfer Method: Not assessed Equipment Used: Gait belt Transfers/Ambulation  Related to ADLs: Hand held assist for ambulation. Min A/Min guard for ambulation ADL Comments: Pt feeling dizzy when standing from EOB. Pt ambulated short distance to [facility] to urinate but never urinated and pt was not feeling well so ambulated back to lay down in bed. Pt able to don/doff socks sitting EOB.  Educated to have rugs picked up in house for safety and safe shoe wear.    OT Diagnosis: Acute pain;Cognitive deficits  OT Problem List: Decreased strength;Decreased range of motion;Decreased activity tolerance;Impaired balance (sitting and/or standing);Decreased knowledge of use of DME or AE;Decreased knowledge of precautions;Decreased cognition;Pain;Impaired UE functional use OT Treatment Interventions: Self-care/ADL training;DME and/or AE instruction;Therapeutic activities;Patient/family education;Balance training;Cognitive remediation/compensation;Therapeutic exercise   OT Goals(Current goals can be found in the care plan section) Acute Rehab OT Goals Patient Stated Goal: not stated OT Goal Formulation: With patient Time For Goal Achievement: 09/01/13 Potential to Achieve Goals: Good ADL Goals Pt Will Perform Grooming: with modified independence;standing Pt Will Perform Lower Body Bathing: with modified independence;sit to/from stand Pt Will Perform Lower Body Dressing: with modified independence;sit to/from stand Pt Will Transfer to Toilet: with modified independence;ambulating (3 in 1 over commode) Pt Will Perform Toileting - Clothing Manipulation and hygiene: with modified independence;sit to/from stand Pt Will Perform Tub/Shower Transfer: Shower transfer;with supervision;ambulating (shower DME tbd)  Visit Information  Last OT Received On: 08/25/13 Assistance Needed: +1 History of Present Illness: pt presents with Anemia, recent falls and FTT.         Prior Functioning     Home Living Family/patient expects to be discharged to:: Private residence Living Arrangements:  Spouse/significant other;Other (Comment) (husband pt reports he is going to hospital today) Available Help at Discharge: Family;Other (Comment) (daughter works) Type of Home: Other(Comment) (townhome) Home  Access: Elevator Home Layout: Multi-level Home Equipment: None Prior Function Level of Independence: Needs assistance ADL's / Homemaking Assistance Needed: does not cook and someone helps with cleaning Communication Communication: No difficulties Dominant Hand: Right         Vision/Perception     Cognition  Cognition Arousal/Alertness: Awake/alert Behavior During Therapy: WFL for tasks assessed/performed Overall Cognitive Status: No family/caregiver present to determine baseline cognitive functioning Area of Impairment: Orientation Orientation Level: Disoriented to;Place;Time    Extremity/Trunk Assessment Upper Extremity Assessment Upper Extremity Assessment: RUE deficits/detail RUE Deficits / Details: Pain in Rt shoulder and on arm; less than full AROM shoulder flexion approximately 100 degrees LUE Deficits / Details: Pain on left UE near wrist; less than full AROM shoulder flexion Lower Extremity Assessment Lower Extremity Assessment: Defer to PT evaluation     Mobility Bed Mobility Bed Mobility: Supine to Sit;Sitting - Scoot to [place] of Bed;Sit to Supine;Scooting to [facility] Supine to Sit: 4: Min assist Sitting - Scoot to Edge of Bed: 3: Mod assist Sit to Supine: 4: Min assist Details for Bed Mobility Assistance: Min A to help with trunk and some with legs when going to sitting position.  Transfers Transfers: Sit to Stand;Stand to Sit Sit to Stand: 4: Min assist;4: Min guard;From bed;From chair/3-in-1 Stand to Sit: 4: Min guard;To chair/3-in-1;To bed Details for Transfer Assistance: Min A for initial stand from bed.      Exercise   Balance   End of Session OT - End of Session Equipment Utilized During Treatment: Gait belt Activity Tolerance: Patient limited by  pain;Other (comment) (dizzy; nervous/anxious?) Patient left: in bed;with call bell/phone within reach;with bed alarm set Nurse Communication: Other (comment) (BP and anxious)  GO     Earlie Raveling OTR/L [phone] 08/25/2013, 3:32 PM

## 2013-08-25 NOTE — Consult Note (Addendum)
WOC wound consult note Reason for Consult: Consult requested for right hand and arm skin tears.  Pt states she fell at home and hit something in the bathroom.  She had significant bleeding to site after this occurred. Wound type: multiple sites of skin tears. Measurement:Right hand  Full thickness 2X1X.1cm pink and moist. No active bleeding.  Small amt yellow drainage. Right space between thumb and finger partial thickness .2X.2X.1cm pink and moist. No active bleeding.  Small amt yellow drainage. Right middle arm scab with old dried blood .1X2cm; no drainage or open wound.  Pt states this happened last week. Right elbow partial thickness 1X1X.1cm yellow and moist, No active bleeding. Mod amt yellow drainage. Right upper arm full thickness skin tear 13X6X.1cm.  90% red, 10% yellow, loose nonviable skin flap is not adhered and removed with scissors.  No skin approximated over site.  Bleeds minimal amt when previous dressing removed which is dry and adhered to wound bed.  Very painful for patient despite moistening with NS.  Applied soft silicone foam non-adherent dressings to all sites to decrease discomfort and promote healing.  Minimize pain and disruption to wound bed by changing Q 3 days. Please re-consult if further assistance is needed.  Thank-you,  Cammie Mcgee MSN, RN, CWOCN, [place], CNS [phone]

## 2013-08-25 NOTE — ED Provider Notes (Signed)
This patient was seen in conjunction with the resident physician, Dr. Littie Deeds. The documentation is accurate and reflects my interpretation, with the following additions:  This elderly female presents from home after a fall.  She has multiple cutaneous lesions, but no radiographic evidence of fracture. Patient was Hemoccult negative, but with abnormal laboratory results, there is some concern for symptomatic anemia and the patient was admitted for further evaluation and management.  Melanie Munch, MD 08/25/13 [phone]

## 2013-08-25 NOTE — Progress Notes (Signed)
Clinical Social Work Department BRIEF PSYCHOSOCIAL ASSESSMENT 08/25/2013  Patient:  Melanie Levy, Melanie Levy     Account Number:  [id number]     Admit date:  08/24/2013  Clinical Social Worker:  Harless Nakayama  Date/Time:  08/25/2013 03:00 PM  Referred by:  Physician  Date Referred:  08/25/2013 Referred for  SNF Placement   Other Referral:   Interview type:  Family Other interview type:   CSW spoke with pt daughter over the phone    PSYCHOSOCIAL DATA Living Status:  HUSBAND Admitted from facility:   Level of care:   Primary support name:  Melanie Levy [phone] Primary support relationship to patient:  CHILD, ADULT Degree of support available:   Pt has support of husband and daughter    CURRENT CONCERNS Current Concerns  Post-Acute Placement   Other Concerns:    SOCIAL WORK ASSESSMENT / PLAN CSW informed that pt and pt spouse both admitted to unit. CSW asked to speak with pt daughter regarding SNF placement for both pts. CSW spoke with daughter who is feeling very overwhelmed at this time. CSW comforted pt daughter and provided support. Pt daughter asking CSW what she needs to do. CSW explained SNF referral process in depth and explained multiple scenarios as far as trying to get both pts admitted and what we as hospital staff will do to assist. CSW informed pt daughter first step would be to request PT eval for pt and send out referal to SNFs in [facility]. Pt daughter had multiple questions about ALF as well and who would assist with that. CSW explained that if dc plan is for ALF CSW would assist otherwise a Child psychotherapist at [facility] would be able to help and pt daughter was also able to find ALF on her own if she wanted. Pt daughter understanding. Pt daughter mentioned multiple times that "there is no money". CSW explained that with the insurance there was a copay for SNF but CSW would get more specific information for pt daughter when facility was chosen. CSW encouraged pt  daughter to apply for Medicaid for pt. Pt daughter understanding and reported to CSW that she will be working on this as soon as possible. Pt daughter also had questions about Veteran's benefits but CSW explained this would be a question to ask the [place]. Pt daughter stated that she was thankful for CSW involvement and would await updates on SNF beds. Pt daughter wanting both pt and pt spouse to be at the same facility. Pt daughter reported to CSW that she will be speaking with pt about SNF but if CSW could please also speak with pt after.    Assessment/plan status:  Psychosocial Support/Ongoing Assessment of Needs Other assessment/ plan:   Information/referral to community resources:   SNF list requested with bed offers    PATIENT'S/FAMILY'S RESPONSE TO PLAN OF CARE: Pt daughter agreeable to SNF and thankful for CSW involvement.       Maylon Sailors, LCSWA [phone]

## 2013-08-26 ENCOUNTER — Inpatient Hospital Stay (HOSPITAL_COMMUNITY): Payer: Medicare Other

## 2013-08-26 DIAGNOSIS — I359 Nonrheumatic aortic valve disorder, unspecified: Secondary | ICD-10-CM

## 2013-08-26 LAB — CBC
HCT: 29.4 % — ABNORMAL LOW (ref 36.0–46.0)
Hemoglobin: 9.2 g/dL — ABNORMAL LOW (ref 12.0–15.0)
MCHC: 31.3 g/dL (ref 30.0–36.0)
Platelets: 368 10*3/uL (ref 150–400)
RDW: 15.5 % (ref 11.5–15.5)

## 2013-08-26 LAB — COMPREHENSIVE METABOLIC PANEL
ALT: 6 U/L (ref 0–35)
AST: 14 U/L (ref 0–37)
Albumin: 3 g/dL — ABNORMAL LOW (ref 3.5–5.2)
Alkaline Phosphatase: 77 U/L (ref 39–117)
BUN: 11 mg/dL (ref 6–23)
CO2: 22 mEq/L (ref 19–32)
GFR calc non Af Amer: 50 mL/min — ABNORMAL LOW (ref >90)
Potassium: 3.7 mEq/L (ref 3.5–5.1)
Sodium: 139 mEq/L (ref 135–145)
Total Protein: 5.9 g/dL — ABNORMAL LOW (ref 6.0–8.3)

## 2013-08-26 NOTE — Progress Notes (Signed)
Subjective: Feels weak and dizzy when standing, appetite is OK.  Objective: Vital signs in last 24 hours: Temp:  [98.2 F (36.8 C)-98.3 F (36.8 C)] 98.3 F (36.8 C) (10/16 0637) Pulse Rate:  [64-86] 86 (10/16 0637) Resp:  [19-20] 20 (10/16 0637) BP: (104-155)/(87-92) 104/89 mmHg (10/16 0637) SpO2:  [97 %-100 %] 100 % (10/16 0637) Weight change:    Intake/Output from previous day: 10/15 0701 - 10/16 0700 In: -  Out: 250 [Urine:250]   General appearance: alert, cooperative and no distress Resp: clear to auscultation bilaterally Cardio: regular rate and rhythm and with a 2/6 SEM, occasional premature beats GI: soft, non-tender; bowel sounds normal; no masses,  no organomegaly Extremities: no peripheral edema  Lab Results:  Recent Labs  08/25/13 0500 08/26/13 0412  WBC 7.5 6.9  HGB 9.1* 9.2*  HCT 28.2* 29.4*  PLT 368 368   BMET  Recent Labs  08/25/13 0500 08/26/13 0412  NA 140 139  K 3.4* 3.7  CL 106 105  CO2 23 22  GLUCOSE 83 80  BUN 12 11  CREATININE 1.01 1.01  CALCIUM 8.2* 8.1*   CMET CMP     Component Value Date/Time   NA 139 08/26/2013 0412   K 3.7 08/26/2013 0412   CL 105 08/26/2013 0412   CO2 22 08/26/2013 0412   GLUCOSE 80 08/26/2013 0412   BUN 11 08/26/2013 0412   CREATININE 1.01 08/26/2013 0412   CALCIUM 8.1* 08/26/2013 0412   PROT 5.9* 08/26/2013 0412   ALBUMIN 3.0* 08/26/2013 0412   AST 14 08/26/2013 0412   ALT 6 08/26/2013 0412   ALKPHOS 77 08/26/2013 0412   BILITOT 0.2* 08/26/2013 0412   GFRNONAA 50* 08/26/2013 0412   GFRAA 58* 08/26/2013 0412    CBG (last 3)  No results found for this basename: GLUCAP,  in the last 72 hours  INR RESULTS:   No results found for this basename: INR, PROTIME     Studies/Results: Dg Chest 2 View  08/24/2013   CLINICAL DATA:  Fall. Esophageal stricture. Hiatal hernia.  EXAM: CHEST  2 VIEW  COMPARISON:  02/07/2013  FINDINGS: Retrocardiac density compatible with moderate-sized hiatal hernia.  Upper normal heart size. Mild airway thickening, particularly notable on the right side.  Chronic density along the right lateral hemidiaphragm has been shown on prior CT scans in 2007 to be fluid density, and based on stability is considered benign.  No pneumothorax observed. Trace blunting of both posterior costophrenic angles. Bony demineralization is present.  IMPRESSION: 1. Trace bilateral pleural effusions with blunting of the posterior costophrenic angles. 2. Mild airway thickening may reflect bronchitis or reactive airways disease. 3. Chronic cystic lesion along the right hemidiaphragm, considered benign. 4. Moderate-sized hiatal hernia. 5. Bony demineralization.   Electronically Signed   By: Herbie Baltimore M.D.   On: 08/24/2013 12:33   Dg Forearm Right  08/24/2013   *RADIOLOGY REPORT*  Clinical Data: Fall, laceration  RIGHT FOREARM - 2 VIEW  Comparison: 08/24/2013  Findings: Bones are osteopenic.  Degenerative arthritis at the wrist.  Normal alignment without acute fracture.  No definite soft tissue abnormality.  IMPRESSION: No acute osseous finding  Osteopenia and wrist osteoarthritis   Original Report Authenticated By: Judie Petit. Miles Costain, M.D.   Ct Head Wo Contrast  08/24/2013   CLINICAL DATA:  Fall.  EXAM: CT HEAD WITHOUT CONTRAST  CT CERVICAL SPINE WITHOUT CONTRAST  TECHNIQUE: Multidetector CT imaging of the head and cervical spine was performed following the standard protocol without  intravenous contrast. Multiplanar CT image reconstructions of the cervical spine were also generated.  COMPARISON:  10/03/2009 MR brain. 10/02/2009 head CT. No comparison cervical spine CT.  FINDINGS: CT HEAD FINDINGS  No skull fracture or intracranial hemorrhage.  Atrophy without hydrocephalus.  Small vessel disease type changes without CT evidence of large acute infarct.  Vascular calcifications.  No intracranial mass lesion noted on this unenhanced exam.  CT CERVICAL SPINE FINDINGS  No cervical spine fracture is noted.  Evaluation slightly limited by dental artifact in the upper cervical spine region. If the patient has symptoms that are highly suggestive of osseous or soft tissue injury MR imaging can be performed for further delineation.  Cervical spondylotic changes with various degrees of spinal stenosis and foraminal narrowing.  Bilateral lung apical parenchymal changes greater on the left have an appearance most suggestive of scarring. Stability can't be confirmed on follow up.  Vascular calcifications.  IMPRESSION: CT head:  No skull fracture or intracranial hemorrhage.  CT cervical spine:  No cervical spine fracture detected. Please see above discussion.   Electronically Signed   By: Bridgett Larsson M.D.   On: 08/24/2013 12:50   Ct Cervical Spine Wo Contrast  08/24/2013   CLINICAL DATA:  Fall.  EXAM: CT HEAD WITHOUT CONTRAST  CT CERVICAL SPINE WITHOUT CONTRAST  TECHNIQUE: Multidetector CT imaging of the head and cervical spine was performed following the standard protocol without intravenous contrast. Multiplanar CT image reconstructions of the cervical spine were also generated.  COMPARISON:  10/03/2009 MR brain. 10/02/2009 head CT. No comparison cervical spine CT.  FINDINGS: CT HEAD FINDINGS  No skull fracture or intracranial hemorrhage.  Atrophy without hydrocephalus.  Small vessel disease type changes without CT evidence of large acute infarct.  Vascular calcifications.  No intracranial mass lesion noted on this unenhanced exam.  CT CERVICAL SPINE FINDINGS  No cervical spine fracture is noted. Evaluation slightly limited by dental artifact in the upper cervical spine region. If the patient has symptoms that are highly suggestive of osseous or soft tissue injury MR imaging can be performed for further delineation.  Cervical spondylotic changes with various degrees of spinal stenosis and foraminal narrowing.  Bilateral lung apical parenchymal changes greater on the left have an appearance most suggestive of scarring.  Stability can't be confirmed on follow up.  Vascular calcifications.  IMPRESSION: CT head:  No skull fracture or intracranial hemorrhage.  CT cervical spine:  No cervical spine fracture detected. Please see above discussion.   Electronically Signed   By: Bridgett Larsson M.D.   On: 08/24/2013 12:50   Dg Hand 2 View Right  08/24/2013   CLINICAL DATA:  Recent traumatic injury with pain  EXAM: RIGHT HAND - 2 VIEW  COMPARISON:  None.  FINDINGS: Generalized osteopenia is identified. Degenerative changes are noted in the interphalangeal joints as well as at the 1st carpometacarpal articulation and within the carpal row. No definitive fracture is seen.  IMPRESSION: Chronic changes without acute abnormality.   Electronically Signed   By: Alcide Clever M.D.   On: 08/24/2013 12:07   Dg Humerus Right  08/24/2013   CLINICAL DATA:  Fall.  Laceration along the arm.  EXAM: RIGHT HUMERUS - 2+ VIEW  COMPARISON:  None.  FINDINGS: No fracture or acute bony findings. No retained foreign body identified.  IMPRESSION: 1. No retained foreign body or acute bony findings.   Electronically Signed   By: Herbie Baltimore M.D.   On: 08/24/2013 12:20    Medications: I  have reviewed the patient's current medications.  Assessment/Plan: #1 Anemia: stable after transfusion.  #2 Gait Instability: unclear cause, will check orthostatic vital signs, brain MRI, anticipate discharge to SNF tomorrow, continue PT and OT for now.   LOS: 2 days   Farrie Sann G 08/26/2013, 8:09 AM

## 2013-08-26 NOTE — Progress Notes (Signed)
EKG is done.  Hard copy is in the chart.

## 2013-08-26 NOTE — Progress Notes (Signed)
Clinical Social Work Department CLINICAL SOCIAL WORK PLACEMENT NOTE 08/26/2013  Patient:  NAKARI, BRACKNELL  Account Number:  [id number] Admit date:  08/24/2013  Clinical Social Worker:  Harless Nakayama  Date/time:  08/26/2013 09:45 AM  Clinical Social Work is seeking post-discharge placement for this patient at the following level of care:   SKILLED NURSING   (*CSW will update this form in Epic as items are completed)   08/26/2013  Patient/family provided with [facility] Department of Clinical Social Work's list of facilities offering this level of care within the geographic area requested by the patient (or if unable, by the patient's family).  08/26/2013  Patient/family informed of their freedom to choose among providers that offer the needed level of care, that participate in Medicare, Medicaid or managed care program needed by the patient, have an available bed and are willing to accept the patient.  08/26/2013  Patient/family informed of [facility]' ownership interest in [facility], as well as of the fact that they are under no obligation to receive care at this facility.  PASARR submitted to EDS on  PASARR number received from EDS on  Existing  FL2 transmitted to all facilities in geographic area requested by pt/family on  08/26/2013 FL2 transmitted to all facilities within larger geographic area on   Patient informed that his/her managed care company has contracts with or will negotiate with  certain facilities, including the following:     Patient/family informed of bed offers received:   Patient chooses bed at  Physician recommends and patient chooses bed at    Patient to be transferred to  on   Patient to be transferred to facility by   The following physician request were entered in Epic:   Additional CommentsSharol Harness, LCSWA [phone]

## 2013-08-26 NOTE — Progress Notes (Addendum)
Occupational Therapy Treatment Patient Details Name: MORAIMA BURD MRN: [id number] DOB: 1928-03-09 Today's Date: 08/26/2013 Time: 4098-1191 OT Time Calculation (min): 26 min  OT Assessment / Plan / Recommendation  History of present illness pt presents with Anemia, recent falls and FTT.     OT comments  Pt making progress with functional goals, however pt seems to be confused and anxious. Pt's dtr in to see pt at end of session and OT spoke with her in hallway about pt's confusion/disoreintation. Pt's dtr states that this disorientation has been since pt has been in the hospital and was not this way at home. Pt's dtr expressed how overwhelmed she feels in having to make d/c decisions for both he parents   Follow Up Recommendations  SNF;Supervision/Assistance - 24 hour    Barriers to Discharge   uncertain at this time    Equipment Recommendations  3 in 1 bedside comode    Recommendations for Other Services    Frequency Min 2X/week   Progress towards OT Goals Progress towards OT goals: Progressing toward goals  Plan Discharge plan remains appropriate    Precautions / Restrictions Precautions Precautions: Fall Restrictions Weight Bearing Restrictions: No   Pertinent Vitals/Pain No c/o pain    ADL  Grooming: Brushing hair;Set up;Supervision/safety;Wash/dry hands;Wash/dry face Where Assessed - Grooming: Supported standing Upper Body Bathing: Performed;Supervision/safety;Set up Lower Body Bathing: Performed;Set up;Min guard Where Assessed - Lower Body Bathing: Supported standing Lower Body Dressing: Performed;Min guard Where Assessed - Lower Body Dressing: Supported standing Toilet Transfer: Radiographer, therapeutic Method: Sit to Barista: Regular height toilet;Grab bars Toileting - Architect and Hygiene: Performed;Supervision/safety Where Assessed - Engineer, mining and Hygiene: Standing Tub/Shower Transfer:  Performed;Min guard Web designer Method: Science writer: Shower seat without back;Grab bars;Walk in shower Transfers/Ambulation Related to ADLs: Hand held assist for ambulation, no c/o dizziness    OT Diagnosis:    OT Problem List:   OT Treatment Interventions:     OT Goals(current goals can now be found in the care plan section)    Visit Information  Last OT Received On: 08/26/13 History of Present Illness: pt presents with Anemia, recent falls and FTT.      Subjective Data      Prior Functioning  Home Living Family/patient expects to be discharged to:: Private residence    Cognition  Cognition Arousal/Alertness: Awake/alert Behavior During Therapy: [facility] for tasks assessed/performed Overall Cognitive Status: Impaired/Different from baseline Area of Impairment: Orientation Orientation Level: Place;Time General Comments: Pt stating that people working at the hospital are in a cult and that last night they were down the hall in the big room drinking alcohol    Mobility  Bed Mobility Bed Mobility: Supine to Sit;Sitting - Scoot to Edge of Bed Supine to Sit: 5: Supervision Sitting - Scoot to Edge of Bed: 5: Supervision Transfers Transfers: Sit to Stand;Stand to Sit Sit to Stand: 4: Min guard;From bed;From chair/3-in-1;From toilet Stand to Sit: To chair/3-in-1;To toilet;4: Min guard    Exercises      Balance Balance Balance Assessed: Yes Dynamic Standing Balance Dynamic Standing - Balance Support: No upper extremity supported;During functional activity Dynamic Standing - Level of Assistance: 5: Stand by assistance;Other (comment) (min guard A)   End of Session OT - End of Session Activity Tolerance: Patient tolerated treatment well Patient left: in chair;with call bell/phone within reach;with family/visitor present  GO     Galen Manila 08/26/2013, 1:14 PM

## 2013-08-26 NOTE — Progress Notes (Signed)
  Echocardiogram 2D Echocardiogram has been performed.  Melanie Levy 08/26/2013, 9:41 AM

## 2013-08-27 DIAGNOSIS — E876 Hypokalemia: Secondary | ICD-10-CM | POA: Diagnosis present

## 2013-08-27 DIAGNOSIS — R2681 Unsteadiness on feet: Secondary | ICD-10-CM | POA: Diagnosis present

## 2013-08-27 DIAGNOSIS — T148XXA Other injury of unspecified body region, initial encounter: Secondary | ICD-10-CM | POA: Diagnosis present

## 2013-08-27 DIAGNOSIS — E46 Unspecified protein-calorie malnutrition: Secondary | ICD-10-CM | POA: Diagnosis present

## 2013-08-27 LAB — PROTEIN ELECTROPHORESIS, SERUM
Alpha-1-Globulin: 7.9 % — ABNORMAL HIGH (ref 2.9–4.9)
Beta 2: 3.9 % (ref 3.2–6.5)
Gamma Globulin: 10.4 % — ABNORMAL LOW (ref 11.1–18.8)
M-Spike, %: NOT DETECTED g/dL

## 2013-08-27 LAB — COMPREHENSIVE METABOLIC PANEL
Alkaline Phosphatase: 73 U/L (ref 39–117)
BUN: 16 mg/dL (ref 6–23)
CO2: 24 mEq/L (ref 19–32)
Chloride: 106 mEq/L (ref 96–112)
Creatinine, Ser: 1.15 mg/dL — ABNORMAL HIGH (ref 0.50–1.10)
GFR calc Af Amer: 50 mL/min — ABNORMAL LOW (ref >90)
GFR calc non Af Amer: 43 mL/min — ABNORMAL LOW (ref >90)
Total Bilirubin: 0.2 mg/dL — ABNORMAL LOW (ref 0.3–1.2)
Total Protein: 5.7 g/dL — ABNORMAL LOW (ref 6.0–8.3)

## 2013-08-27 LAB — CBC
HCT: 28.6 % — ABNORMAL LOW (ref 36.0–46.0)
MCH: 26.5 pg (ref 26.0–34.0)
MCV: 83.4 fL (ref 78.0–100.0)
RBC: 3.43 MIL/uL — ABNORMAL LOW (ref 3.87–5.11)
RDW: 15.7 % — ABNORMAL HIGH (ref 11.5–15.5)
WBC: 7.5 10*3/uL (ref 4.0–10.5)

## 2013-08-27 LAB — FOLATE RBC: RBC Folate: 323 ng/mL — ABNORMAL LOW (ref >=366)

## 2013-08-27 MED ORDER — LEVOTHYROXINE SODIUM 50 MCG PO TABS: 50.0000 ug | ORAL_TABLET | Freq: Every day | ORAL | Status: DC

## 2013-08-27 MED ORDER — ACETAMINOPHEN 325 MG PO TABS: 325.0000 mg | ORAL_TABLET | ORAL | Status: DC | PRN

## 2013-08-27 NOTE — Discharge Summary (Signed)
Physician Discharge Summary  Patient ID: Melanie Levy MRN: [id number] DOB/AGE: [birth year] 79 y.o.  Admit date: 08/24/2013 Discharge date: 08/27/2013   Discharge Diagnoses:  Principal Problem:   ANEMIA Active Problems:   Gait instability   Unspecified protein-calorie malnutrition   Multiple skin tears   Hypokalemia   IRON DEFICIENCY   DEPRESSION/ANXIETY   ASTHMA   HIATAL HERNIA   Discharged Condition: good  Hospital Course:   The patient is an 79 year old woman who lives at home with her Husband and has been falling frequently Lately. After some recent falls she was offered an appt c Dr Eloise Harman for Monday but she declined. We were called at the office today as daughter wanted FMLA papers signed, Home Health set up and she was trying to get services to her parents. She also stated that her mom fell and was in the ED. The fall that Ms Kilbride suffered resulted in right upper arm and right lower arm skin tear after a fall in the bathroom. Pt denies hitting head or LOC. Stated that she tripped over the toilet. Pt has large skin tear to right upper arm, bleeding controlled. No Loss of Consciousness. No Sz, +HA. W/Up was (-) in ED x the cuts, lacs and bruises and new Dx of Sxatic anemia. hbg 7.5. Heme (-). No obvious recent loss of Blood. Hbg > 11 in Feb in our office and 10.3-11.3 over 2013 as seen on EPIC. She repots Weakness, AFTT, SOB, Leg cramps, hunger and constipation. I was called for inpt admission. T and cross ordered and she has started 2 units PRBC transfusion. I am seeing her up on 6 N and she is upset, frustrated and when i brought up ALF she seems amenable.   she had moderately severe anemia without signs of acute GI bleed. Chills on multiple skin tears on her upper extremities. Numerous x-ray studies showed no evidence of fracture or intracranial bleed. She was treated with a transfusion of 2 units of packed red blood cells with improvement in her hematocrit. She also had  labs drawn that showed moderately decreased serum albumin level consistent with protein calorie malnutrition and mild hypokalemia. She was seen by a wound care specialist who made recommendations regarding treatment of her multiple skin tears. She recommended use of soft silicone foam, non-adherent dressings to all sites to decrease discomfort and promote healing with dressing changes every 3 days. The skin tears were present in the right hand, right arm, and right elbow region. She also had an EKG done that showed a normal sinus rhythm with premature atrial contractions, and telemetry did not show any concerning arrhythmias. She had an echocardiogram done that showed normal left ventricular systolic function with mild aortic insufficiency. She also had a brain MRI scan done without contrast that showed no evidence of acute infarction or mass lesion or hemorrhage. She was seen by staff from physical therapy and occupational therapy who recommended rehabilitation of her gait instability and a skilled nursing facility. Of note, her husband is her primary caregiver and is currently an inpatient in the hospital. She has a daughter who assists in her care at home. The plan will be for her to go to a skilled nursing facility for rehabilitation of her physical deconditioning, gait instability, and protein calorie malnutrition. There were no complications of her hospitalization. On the day of discharge she was alert and answered questions appropriately, and she was in no apparent distress.  Consults: None  Significant Diagnostic Studies:  Mr Brain Wo Contrast  08/26/2013   CLINICAL DATA:  Unsteady gait with falls. Rule out stroke.  EXAM: MRI HEAD WITHOUT CONTRAST  TECHNIQUE: Multiplanar, multisequence MR imaging was performed. No intravenous contrast was administered.  COMPARISON:  CT 08/24/2013. MRI 10/03/2009  FINDINGS: Negative for acute infarct.  Generalized atrophy of a moderate degree. Chronic microvascular  ischemic change in the white matter and pons. Small chronic infarcts in the cerebellum bilaterally.  Negative for hemorrhage or mass. No midline shift.  Mild mucosal thickening in the paranasal sinuses.  IMPRESSION: Atrophy and chronic microvascular ischemic change. No acute infarct.   Electronically Signed   By: Marlan Palau M.D.   On: 08/26/2013 15:17    Labs: Lab Results  Component Value Date   WBC 7.5 08/27/2013   HGB 9.1* 08/27/2013   HCT 28.6* 08/27/2013   MCV 83.4 08/27/2013   PLT 378 08/27/2013     Recent Labs Lab 08/27/13 0525  NA 141  K 3.4*  CL 106  CO2 24  BUN 16  CREATININE 1.15*  CALCIUM 8.0*  PROT 5.7*  BILITOT 0.2*  ALKPHOS 73  ALT 6  AST 12  GLUCOSE 88       No results found for this basename: INR, PROTIME     No results found for this or any previous visit (from the past 240 hour(s)).    Discharge Exam: Blood pressure 145/79, pulse 79, temperature 98.3 F (36.8 C), temperature source Oral, resp. rate 20, height 5\' 4"  (1.626 m), weight 54.2 kg (119 lb 7.8 oz), SpO2 98.00%.  Physical Exam: In general, the patient is an elderly white woman who was in no apparent distress while lying partially upright in bed. HEENT exam was within normal limits, neck was supple without jugular venous distention or carotid bruit, chest was clear to auscultation, heart had a regular rate and rhythm with a systolic ejection murmur grade 1/6 the left sternal border, abdomen had normal bowel sounds and no hepatosplenomegaly or tenderness, extremities were without cyanosis, clubbing, or edema. She was alert and answered questions appropriately she was able to move all extremities well. There are multiple skin tears on her right upper extremity they're currently covered with gauze.  Disposition: She will be discharged from the hospital to go to a skilled nursing facility for rehabilitation. Following completion of rehabilitation she should call [facility] to  schedule a followup visit with Dr. Jarome Matin at telephone [phone].  Discharge Orders   Future Orders Complete By Expires   Call MD for:  As directed    Comments:     Call for fever, chills, difficulty breathing, abdominal pain, or any other concerning symptom   Diet - low sodium heart healthy  As directed    Discharge instructions  As directed    Comments:     You will be discharged to a skilled nursing facility for treatment of your skin tears and therapy to improve your strength and ability to safely walk.   Increase activity slowly  As directed        Medication List    STOP taking these medications       meloxicam 15 MG tablet  Commonly known as:  MOBIC      TAKE these medications       FLUoxetine 20 MG capsule  Commonly known as:  PROZAC  Take 20 mg by mouth daily.     Fluticasone-Salmeterol 100-50 MCG/DOSE Aepb  Commonly known as:  ADVAIR  Inhale 1  puff into the lungs daily.     iron polysaccharides 150 MG capsule  Commonly known as:  NIFEREX  Take 150 mg by mouth 2 (two) times daily.     levothyroxine 50 MCG tablet  Commonly known as:  SYNTHROID  Take 1 tablet (50 mcg total) by mouth daily before breakfast.     LORazepam 0.5 MG tablet  Commonly known as:  ATIVAN  Take 0.5 mg by mouth 2 (two) times daily.     mirtazapine 15 MG tablet  Commonly known as:  REMERON  Take 15 mg by mouth at bedtime.     mupirocin cream 2 %  Commonly known as:  BACTROBAN  Apply 1 application topically daily. To wound and cover with bandage.     omeprazole 20 MG capsule  Commonly known as:  PRILOSEC  Take 20 mg by mouth daily.     simvastatin 20 MG tablet  Commonly known as:  ZOCOR  Take 20 mg by mouth every evening.     traMADol 50 MG tablet  Commonly known as:  ULTRAM  Take 50 mg by mouth every 6 (six) hours as needed for pain.         Signed: Garlan Fillers 08/27/2013, 8:05 AM

## 2013-08-27 NOTE — Progress Notes (Signed)
CSW (Clinical Social Worker) aware pt is ready dc. CSW left message for pt daughter. CSW contacted [facility] to inform. Wendy at facility to call CSW and inform of when pt daughter will be completing paperwork and pt's can be transferred. CSW to assist once call received.  Jeromy Borcherding, LCSWA [phone]  

## 2013-08-27 NOTE — Clinical Social Work Note (Signed)
CSW called for transportation per Consulting civil engineer.  Vickii Penna, LCSWA [phone]  Clinical Social Work

## 2013-10-12 DIAGNOSIS — Y939 Activity, unspecified: Secondary | ICD-10-CM | POA: Insufficient documentation

## 2013-10-12 DIAGNOSIS — Z888 Allergy status to other drugs, medicaments and biological substances status: Secondary | ICD-10-CM | POA: Insufficient documentation

## 2013-10-12 DIAGNOSIS — F3289 Other specified depressive episodes: Secondary | ICD-10-CM | POA: Insufficient documentation

## 2013-10-12 DIAGNOSIS — M129 Arthropathy, unspecified: Secondary | ICD-10-CM | POA: Insufficient documentation

## 2013-10-12 DIAGNOSIS — K589 Irritable bowel syndrome without diarrhea: Secondary | ICD-10-CM | POA: Insufficient documentation

## 2013-10-12 DIAGNOSIS — H3530 Unspecified macular degeneration: Secondary | ICD-10-CM | POA: Insufficient documentation

## 2013-10-12 DIAGNOSIS — Z8719 Personal history of other diseases of the digestive system: Secondary | ICD-10-CM | POA: Insufficient documentation

## 2013-10-12 DIAGNOSIS — J45909 Unspecified asthma, uncomplicated: Secondary | ICD-10-CM | POA: Insufficient documentation

## 2013-10-12 DIAGNOSIS — F329 Major depressive disorder, single episode, unspecified: Secondary | ICD-10-CM | POA: Insufficient documentation

## 2013-10-12 DIAGNOSIS — R269 Unspecified abnormalities of gait and mobility: Secondary | ICD-10-CM | POA: Insufficient documentation

## 2013-10-12 DIAGNOSIS — Z9181 History of falling: Secondary | ICD-10-CM | POA: Insufficient documentation

## 2013-10-12 DIAGNOSIS — F0390 Unspecified dementia without behavioral disturbance: Secondary | ICD-10-CM | POA: Insufficient documentation

## 2013-10-12 DIAGNOSIS — Y9210 Unspecified residential institution as the place of occurrence of the external cause: Secondary | ICD-10-CM | POA: Insufficient documentation

## 2013-10-12 DIAGNOSIS — S3210XA Unspecified fracture of sacrum, initial encounter for closed fracture: Secondary | ICD-10-CM | POA: Insufficient documentation

## 2013-10-12 DIAGNOSIS — M810 Age-related osteoporosis without current pathological fracture: Secondary | ICD-10-CM | POA: Insufficient documentation

## 2013-10-12 DIAGNOSIS — Z79899 Other long term (current) drug therapy: Secondary | ICD-10-CM | POA: Insufficient documentation

## 2013-10-12 DIAGNOSIS — Z880 Allergy status to penicillin: Secondary | ICD-10-CM | POA: Insufficient documentation

## 2013-10-12 DIAGNOSIS — G4700 Insomnia, unspecified: Secondary | ICD-10-CM | POA: Insufficient documentation

## 2013-10-12 DIAGNOSIS — W07XXXA Fall from chair, initial encounter: Secondary | ICD-10-CM | POA: Insufficient documentation

## 2013-10-12 DIAGNOSIS — Z8673 Personal history of transient ischemic attack (TIA), and cerebral infarction without residual deficits: Secondary | ICD-10-CM | POA: Insufficient documentation

## 2013-10-12 DIAGNOSIS — D509 Iron deficiency anemia, unspecified: Secondary | ICD-10-CM | POA: Insufficient documentation

## 2013-10-12 DIAGNOSIS — F411 Generalized anxiety disorder: Secondary | ICD-10-CM | POA: Insufficient documentation

## 2013-10-12 DIAGNOSIS — S32509A Unspecified fracture of unspecified pubis, initial encounter for closed fracture: Secondary | ICD-10-CM | POA: Insufficient documentation

## 2013-10-12 DIAGNOSIS — K219 Gastro-esophageal reflux disease without esophagitis: Secondary | ICD-10-CM | POA: Insufficient documentation

## 2013-10-13 ENCOUNTER — Emergency Department (HOSPITAL_COMMUNITY): Payer: Medicare Other

## 2013-10-13 ENCOUNTER — Encounter (HOSPITAL_COMMUNITY): Payer: Self-pay | Admitting: Emergency Medicine

## 2013-10-13 ENCOUNTER — Emergency Department (HOSPITAL_COMMUNITY)
Admission: EM | Admit: 2013-10-13 | Discharge: 2013-10-13 | Disposition: A | Discharge status: Home / Self Care | Payer: Medicare Other | Attending: Emergency Medicine | Admitting: Emergency Medicine

## 2013-10-13 DIAGNOSIS — R296 Repeated falls: Secondary | ICD-10-CM

## 2013-10-13 DIAGNOSIS — S32591A Other specified fracture of right pubis, initial encounter for closed fracture: Secondary | ICD-10-CM

## 2013-10-13 DIAGNOSIS — S3210XA Unspecified fracture of sacrum, initial encounter for closed fracture: Secondary | ICD-10-CM

## 2013-10-13 LAB — CBC WITH DIFFERENTIAL/PLATELET
Basophils Relative: 1 % (ref 0–1)
Hemoglobin: 9.5 g/dL — ABNORMAL LOW (ref 12.0–15.0)
Lymphocytes Relative: 23 % (ref 12–46)
Lymphs Abs: 1.5 10*3/uL (ref 0.7–4.0)
MCV: 83.9 fL (ref 78.0–100.0)
Monocytes Relative: 9 % (ref 3–12)
Neutro Abs: 3.9 10*3/uL (ref 1.7–7.7)
Neutrophils Relative %: 60 % (ref 43–77)
Platelets: 336 10*3/uL (ref 150–400)
RBC: 3.73 MIL/uL — ABNORMAL LOW (ref 3.87–5.11)
RDW: 17.7 % — ABNORMAL HIGH (ref 11.5–15.5)
WBC: 6.4 10*3/uL (ref 4.0–10.5)

## 2013-10-13 LAB — BASIC METABOLIC PANEL
BUN: 17 mg/dL (ref 6–23)
Chloride: 106 mEq/L (ref 96–112)
Creatinine, Ser: 1.02 mg/dL (ref 0.50–1.10)
GFR calc Af Amer: 57 mL/min — ABNORMAL LOW (ref >90)
Glucose, Bld: 89 mg/dL (ref 70–99)
Potassium: 3.9 mEq/L (ref 3.5–5.1)
Sodium: 141 mEq/L (ref 135–145)

## 2013-10-13 LAB — URINALYSIS, ROUTINE W REFLEX MICROSCOPIC
Bilirubin Urine: NEGATIVE
Glucose, UA: NEGATIVE mg/dL
Ketones, ur: NEGATIVE mg/dL
Leukocytes, UA: NEGATIVE
Specific Gravity, Urine: 1.015 (ref 1.005–1.030)
Urobilinogen, UA: 0.2 mg/dL (ref 0.0–1.0)
pH: 6 (ref 5.0–8.0)

## 2013-10-13 LAB — URINE MICROSCOPIC-ADD ON

## 2013-10-13 LAB — GLUCOSE, CAPILLARY: Glucose-Capillary: 79 mg/dL (ref 70–99)

## 2013-10-13 MED ORDER — TRAMADOL HCL 50 MG PO TABS: 50.0000 mg | ORAL_TABLET | Freq: Four times a day (QID) | ORAL | Status: DC | PRN

## 2013-10-13 MED ORDER — ACETAMINOPHEN 325 MG PO TABS
650.0000 mg | ORAL_TABLET | Freq: Once | ORAL | Status: DC
  Filled 2013-10-13: qty 2

## 2013-10-13 NOTE — ED Notes (Signed)
Physical therapy at bedside with patient for evaluation.   Called social work and they will check with PT recommendations as to patient release back to facility.

## 2013-10-13 NOTE — ED Notes (Signed)
Called and spoke with caregiver at [facility]-  Pt has had multiple falls from sitting over the past week- pt complaining of right hip pain.  X-rays completed there that revealed fractures (paperwork sent with pt).  Caregiver reports pt has early onset of Alzheimer's, states that pt current mental status is normal per her.  Results of x-rays given to Dr. Jodi Mourning.

## 2013-10-13 NOTE — Evaluation (Signed)
Physical Therapy Evaluation Patient Details Name: Melanie Levy MRN: [id number] DOB: 1928-08-01 Today's Date: 10/13/2013 Time: 6295-2841 PT Time Calculation (min): 26 min  PT Assessment / Plan / Recommendation History of Present Illness  75 female with anemia, depression, dementia, anxiety, stroke presents after slipping from her chair yesterday, worsening right hip pain that has been chronic. No syncope reported. Pt denies all sxs except right hip pain. Pain with rom. No head injury.   Clinical Impression  After speaking with daughter and obtaining PLOF, pt functioning at baseline. Pt non-ambulatory and is mod I with w/c mobility. Pt spouse avail to provide 24/7. Pt safe to d/c back to ALF with assist of spouse once medically stable. Pt with no acute PT service needs at this time. PT signing off. Please re-consult if needed in future.    PT Assessment  All further PT needs can be met in the next venue of care    Follow Up Recommendations  Home health PT;Supervision/Assistance - 24 hour    Does the patient have the potential to tolerate intense rehabilitation      Barriers to Discharge        Equipment Recommendations  None recommended by PT    Recommendations for Other Services     Frequency      Precautions / Restrictions Precautions Precautions: Fall Precaution Comments: pt with dementia Restrictions Weight Bearing Restrictions: No   Pertinent Vitals/Pain Pt did not rate but reports of R hip to knee pain however it did not get worse in weight-bearing during transfers      Mobility  Bed Mobility Bed Mobility: Not assessed Transfers Transfers: Stand Pivot Transfers Stand Pivot Transfers: 2: Max assist Details for Transfer Assistance: pt completed std pvt chair to bed, then bed to chair. Pt with no increased R LE pain in weight-bearing. Pt able to initiate with use of hand however takes small,short steps Ambulation/Gait Ambulation/Gait Assistance: Not tested  (comment) (pt non-ambulatory) Wheelchair Mobility Wheelchair Mobility: No    Exercises     PT Diagnosis: Generalized weakness  PT Problem List: Decreased strength;Decreased balance;Decreased mobility PT Treatment Interventions:       PT Goals(Current goals can be found in the care plan section) Acute Rehab PT Goals Patient Stated Goal: to go home PT Goal Formulation: No goals set, d/c therapy  Visit Information  Last PT Received On: 10/13/13 Assistance Needed: +1 History of Present Illness: 55 female with anemia, depression, dementia, anxiety, stroke presents after slipping from her chair yesterday, worsening right hip pain that has been chronic. No syncope reported. Pt denies all sxs except right hip pain. Pain with rom. No head injury.        Prior Functioning  Home Living Family/patient expects to be discharged to:: Assisted living Home Equipment: None (pt ) Additional Comments: pt with all recommended DME Prior Function Level of Independence: Needs assistance Gait / Transfers Assistance Needed: pt non-ambulatory and requires assist x 1 for transfer in/out of w/c ADL's / Homemaking Assistance Needed: facility staff assist pt with bathing. pt able to push self in w/c down to dining all Communication Communication: No difficulties Dominant Hand: Right    Cognition  Cognition Arousal/Alertness: Awake/alert Behavior During Therapy: WFL for tasks assessed/performed Overall Cognitive Status: History of cognitive impairments - at baseline (pt with dementia) Memory: Decreased short-term memory    Extremity/Trunk Assessment Upper Extremity Assessment Upper Extremity Assessment: Generalized weakness Lower Extremity Assessment Lower Extremity Assessment: Generalized weakness Cervical / Trunk Assessment Cervical / Trunk Assessment:  Kyphotic   Balance Balance Balance Assessed: Yes Static Sitting Balance Static Sitting - Balance Support: No upper extremity supported;Feet  supported Static Sitting - Level of Assistance: 5: Stand by assistance Static Sitting - Comment/# of Minutes: 5 min  End of Session PT - End of Session Equipment Utilized During Treatment: Gait belt Activity Tolerance: Patient tolerated treatment well Patient left: in chair;with call bell/phone within reach Nurse Communication: Mobility status  GP Functional Assessment Tool Used: clinical judgment Functional Limitation: Mobility: Walking and moving around Mobility: Walking and Moving Around Current Status ([id number]): At least 60 percent but less than 80 percent impaired, limited or restricted Mobility: Walking and Moving Around Goal Status [phone]): At least 60 percent but less than 80 percent impaired, limited or restricted Mobility: Walking and Moving Around Discharge Status [phone]): At least 60 percent but less than 80 percent impaired, limited or restricted   Marcene Brawn 10/13/2013, 1:52 PM  Lewis Shock, PT, DPT Pager #: [phone] Office #: [phone]

## 2013-10-13 NOTE — Clinical Social Work Psychosocial (Signed)
Met with patient to assess- CSW noted patient from a facility- patient reports she has been living at [facility] for about 1 week- she states she and her husband of 60+ years  live there together and she has been pleased with the care. Patient also reports that she has one daughter- Evian Derringer [phone]) who also lives locally- patient gave CSW permission to speak with her as well. Patient's daughter confirms that patient and spouse have just transitioned to ALF- patient was at [facility]  prior to this move. She feels very strongly about keeping her parents together at the ALF if at all possible-  CSW is awaiting PT eval as well as contact with ALF to confirm patient is appropriate for return to ALF with husband- daughter reports patient uses a wheelchair primarily at the ALF due to dementia, macular degeneration and unsteadiness.  Patient voiced concerns related to the Police coming last night as well as lawyer coming by today- per discussion with daughter, this was probably the EMT not Police and the Doctor not a Clinical research associate- CSW will continue to check in and follow up with ALF while waiting for PT eval and recommendations-  Reece Levy, MSW, [employer] [phone]

## 2013-10-13 NOTE — ED Notes (Addendum)
Spoke with pt's daughter to update on condition. [phone]

## 2013-10-13 NOTE — Consult Note (Signed)
Garlan Fillers, MD Chief Complaint: Slip from chair yesterday History: History per chart - patient with dementia, no representative from SNF present.  20 female with anemia, depression, dementia, anxiety, stroke presents after slipping from her chair yesterday, worsening right hip pain that has been chronic. No syncope reported. Pt denies all sxs except right hip pain. Pain with rom. No head injury.    Past Medical History  Diagnosis Date  . Diverticulosis   . Esophageal stricture   . Hiatal hernia   . Iron deficiency anemia   . Depression   . Anxiety   . Arthritis   . Asthma   . Gastric ulcer 1994  . Partial small bowel obstruction 1995  . IBS (irritable bowel syndrome)   . GERD (gastroesophageal reflux disease)   . Osteoporosis   . Insomnia   . Macular degeneration   . Seborrheic keratosis   . Stroke     Allergies  Allergen Reactions  . Iohexol      Desc: Pt states that years ago she had a procedure that involved IV contrast and had throat swelling and sob. She was told never to have IV contrast again.  Pt is a very poor historian and cannot remember the date or the procedure.   . Milk-Related Compounds Nausea And Vomiting  . Penicillins Swelling    Tongue swelling    No current facility-administered medications on file prior to encounter.   Current Outpatient Prescriptions on File Prior to Encounter  Medication Sig Dispense Refill  . FLUoxetine (PROZAC) 20 MG capsule Take 20 mg by mouth daily.      . Fluticasone-Salmeterol (ADVAIR) 100-50 MCG/DOSE AEPB Inhale 1 puff into the lungs daily.      . iron polysaccharides (NIFEREX) 150 MG capsule Take 150 mg by mouth 2 (two) times daily.        Marland Kitchen LORazepam (ATIVAN) 0.5 MG tablet Take 0.5 mg by mouth 2 (two) times daily.       . mirtazapine (REMERON) 15 MG tablet Take 15 mg by mouth at bedtime.        Marland Kitchen omeprazole (PRILOSEC) 20 MG capsule Take 20 mg by mouth daily.      . simvastatin (ZOCOR) 20 MG tablet Take 20 mg by  mouth every evening.      . traMADol (ULTRAM) 50 MG tablet Take 50 mg by mouth every 6 (six) hours as needed (takes 2 scheduled doses 8am and 8pm   may have additional doses if needed for pain).         Physical Exam: Filed Vitals:   10/13/13 0618  BP: 171/91  Pulse: 87  Temp:   Resp: 16  patient confused (dementia at baseline) Moving all extremities to command Compartments soft/NT abd soft/nt No sob/cp Pelvis: tenderness over right pubic rami and sacrum.  No ecchymosis/brusing No pain with rotation of hip   Image: Dg Hip Complete Right  10/13/2013   CLINICAL DATA:  Fall, right hip pain.  EXAM: RIGHT HIP - COMPLETE 2+ VIEW  COMPARISON:  None.  FINDINGS: Fracture of the medial and posterior acetabular wall is questioned. Superior and inferior right pubic rami fractures. Fracture extends parasymphyseal. No pubic symphysis diastases. Femoral heads remain seated within the acetabulum. No right femoral neck fracture is visualized. Diffuse osteopenia.  IMPRESSION: Question nondisplaced fracture involving the medial and posterior right acetabulum. Correlate with CT.  Displaced right superior and inferior pubic rami fractures.  No definite right femur fracture.   Electronically Signed   By:  Jearld Lesch M.D.   On: 10/13/2013 01:13   Ct Hip Right Wo Contrast  10/13/2013   CLINICAL DATA:  Fall  EXAM: CT OF THE RIGHT HIP WITHOUT CONTRAST  TECHNIQUE: Multidetector CT imaging was performed according to the standard protocol. Multiplanar CT image reconstructions were also generated.  COMPARISON:  10/13/2013 radiograph  FINDINGS: Diffuse osteopenia. Degenerative changes of the lower lumbar spine. Bilateral sacral fractures. Nonspecific rounded sclerotic focus within the right hemi sacrum.  Two inferior pubic rami fractures with displacement of the posterior component. There is callus formation suggesting this is subacute. Comminuted right superior pubic ramus fracture also shows some callus formation  suggesting acute on subacute. No pubic symphysis diastases. The femoral heads remain seated within the acetabula. No displaced femoral neck or acetabular fracture. Bilateral calcific densities within the joint space may reflect loose bodies. Enthesopathic change versus sequelae of prior avulsion injury along the right ischium. Nothing acute intraperitoneal.  IMPRESSION: Bilateral sacral fractures. Right superior and inferior pubic rami fractures appear acute on subacute as above.  No definite acetabular or femoral neck fracture. Given the diffuse osteopenia, MRI should be considered if nondisplaced fracture remains of clinical concern.  These results were called by telephone at the time of interpretation on 10/13/2013 at 4:43 AM to Dr. Blane Ohara , who verbally acknowledged these results.   Electronically Signed   By: Jearld Lesch M.D.   On: 10/13/2013 04:44    A/P:   Called to see patient for pelvic injury.  CT scan reviewed.  Agree that this appears to be a sub-acute injury.  Clinically patient does have some right sided pelvic pain with palpation.  Patient has dementia and is poor historian.  Unclear if she was ambulating prior or reason for ER visit. Plan: need PT eval to determine if she can ambulate with walker and is safe.   If she has pain with ambulation then will need MRI to r/o occult hip fracture (as recommended by radiologist) If she clears PT can be d/c to SNF and f/u in 4 weeks for re-evaluation.

## 2013-10-13 NOTE — ED Notes (Signed)
Patient refuses vitals, still upset from in/out cath.  Patient states "I want to go home right now".   I explained to patient that we were waiting on ortho to come see her.  Patient verbalized understanding.

## 2013-10-13 NOTE — ED Notes (Signed)
[phone] [facility] would like to be update once decision is made about pt.

## 2013-10-13 NOTE — ED Notes (Signed)
Ortho PA will be down to evaluate pt

## 2013-10-13 NOTE — ED Notes (Signed)
Patient's RN from [facility] called to check on patient.   She advised to please let the facility know if we will be admitting patient or if we are sending her back to them.

## 2013-10-13 NOTE — Social Work (Signed)
Patient evaluated by PT and is ok for d/c back to ALF. Updated daughter and ALF staff and will plan for transfer back via EMS. Daughter appreciative of care and assistance- and agreeable to plans.   Reece Levy, MSW, [employer] [phone]

## 2013-10-13 NOTE — ED Notes (Signed)
Patient continues to state "I have to get home, I didn't do anything".  Tried to reassure patient that she was okay and that we understood that she was wanting to go home.  Patient redirected.   Patient crying.

## 2013-10-13 NOTE — ED Provider Notes (Signed)
CSN: [id number]     Arrival date & time 10/12/13  2357 History   First MD Initiated Contact with Patient 10/13/13 0101     Chief Complaint  Patient presents with  . Fall   (Consider location/radiation/quality/duration/timing/severity/associated sxs/prior Treatment) HPI Comments: 79 female with anemia, depression, dementia, anxiety, stroke presents after slipping from her chair yesterday, worsening right hip pain that has been chronic.  No syncope reported.  Pt denies all sxs except right hip pain.  Pain with rom.  No head injury.   Patient is a 79 y.o. female presenting with fall. The history is provided by the patient and the nursing home.  Fall This is a recurrent problem. Pertinent negatives include no chest pain, no abdominal pain, no headaches and no shortness of breath.    Past Medical History  Diagnosis Date  . Diverticulosis   . Esophageal stricture   . Hiatal hernia   . Iron deficiency anemia   . Depression   . Anxiety   . Arthritis   . Asthma   . Gastric ulcer 1994  . Partial small bowel obstruction 1995  . IBS (irritable bowel syndrome)   . GERD (gastroesophageal reflux disease)   . Osteoporosis   . Insomnia   . Macular degeneration   . Seborrheic keratosis   . Stroke    Past Surgical History  Procedure Laterality Date  . Cholecystectomy    . Appendectomy    . Tonsillectomy    . Spine surgery    . Bunionectomy    . Sinus surgery with instatrak     Family History  Problem Relation Age of Onset  . Ovarian cancer Mother   . Heart disease    . Macular degeneration    . Colon cancer Neg Hx    History  Substance Use Topics  . Smoking status: Never Smoker   . Smokeless tobacco: Never Used  . Alcohol Use: Yes     Comment: not often    OB History   Grav Para Term Preterm Abortions TAB SAB Ect Mult Living                 Review of Systems  Constitutional: Negative for fever and chills.  HENT: Negative for congestion.   Eyes: Negative for visual  disturbance.  Respiratory: Negative for shortness of breath.   Cardiovascular: Negative for chest pain.  Gastrointestinal: Negative for vomiting and abdominal pain.  Genitourinary: Negative for dysuria and flank pain.  Musculoskeletal: Positive for arthralgias and gait problem. Negative for back pain, neck pain and neck stiffness.  Skin: Negative for rash.  Neurological: Negative for light-headedness and headaches.    Allergies  Iohexol; Milk-related compounds; and Penicillins  Home Medications   Current Outpatient Rx  Name  Route  Sig  Dispense  Refill  . FLUoxetine (PROZAC) 20 MG capsule   Oral   Take 20 mg by mouth daily.         . Fluticasone-Salmeterol (ADVAIR) 100-50 MCG/DOSE AEPB   Inhalation   Inhale 1 puff into the lungs daily.         . iron polysaccharides (NIFEREX) 150 MG capsule   Oral   Take 150 mg by mouth 2 (two) times daily.           Marland Kitchen levothyroxine (SYNTHROID, LEVOTHROID) 75 MCG tablet   Oral   Take 75 mcg by mouth daily before breakfast.         . LORazepam (ATIVAN) 0.5 MG tablet   Oral  Take 0.5 mg by mouth 2 (two) times daily.          . memantine (NAMENDA) 10 MG tablet   Oral   Take 10 mg by mouth 2 (two) times daily.         . mirtazapine (REMERON) 15 MG tablet   Oral   Take 15 mg by mouth at bedtime.           Marland Kitchen omeprazole (PRILOSEC) 20 MG capsule   Oral   Take 20 mg by mouth daily.         . simvastatin (ZOCOR) 20 MG tablet   Oral   Take 20 mg by mouth every evening.         . traMADol (ULTRAM) 50 MG tablet   Oral   Take 50 mg by mouth every 6 (six) hours as needed (takes 2 scheduled doses 8am and 8pm   may have additional doses if needed for pain).           BP 158/77  Pulse 84  Temp(Src) 98.4 F (36.9 C) (Oral)  Resp 14  SpO2 98% Physical Exam  Nursing note and vitals reviewed. Constitutional: She appears well-developed and well-nourished.  HENT:  Head: Normocephalic and atraumatic.  Mild dry mm   Eyes: Conjunctivae are normal. Right eye exhibits no discharge. Left eye exhibits no discharge.  Neck: Normal range of motion. Neck supple. No tracheal deviation present.  Cardiovascular: Normal rate and regular rhythm.   Pulmonary/Chest: Effort normal and breath sounds normal.  Abdominal: Soft. She exhibits no distension. There is no tenderness. There is no guarding.  Musculoskeletal: She exhibits tenderness (mild with right hip flexion and compression). She exhibits no edema.  Neurological: She is alert. No cranial nerve deficit.  Mild flat affect, pleasant dementia 5+ strength in UE and LE with f/e at major joints. Sensation to palpation intact in UE and LE. CNs 2-12 grossly intact.  EOMFI.  PERRL.   Finger nose and coordination intact bilateral.   Visual fields intact to finger testing.   Skin: Skin is warm. No rash noted.  Psychiatric: She has a normal mood and affect.    ED Course  Procedures (including critical care time) Labs Review Labs Reviewed  CBC WITH DIFFERENTIAL - Abnormal; Notable for the following:    RBC 3.73 (*)    Hemoglobin 9.5 (*)    HCT 31.3 (*)    MCH 25.5 (*)    RDW 17.7 (*)    Eosinophils Relative 7 (*)    All other components within normal limits  BASIC METABOLIC PANEL - Abnormal; Notable for the following:    GFR calc non Af Amer 49 (*)    GFR calc Af Amer 57 (*)    All other components within normal limits  GLUCOSE, CAPILLARY  URINALYSIS, ROUTINE W REFLEX MICROSCOPIC   Imaging Review Dg Hip Complete Right  10/13/2013   CLINICAL DATA:  Fall, right hip pain.  EXAM: RIGHT HIP - COMPLETE 2+ VIEW  COMPARISON:  None.  FINDINGS: Fracture of the medial and posterior acetabular wall is questioned. Superior and inferior right pubic rami fractures. Fracture extends parasymphyseal. No pubic symphysis diastases. Femoral heads remain seated within the acetabulum. No right femoral neck fracture is visualized. Diffuse osteopenia.  IMPRESSION: Question  nondisplaced fracture involving the medial and posterior right acetabulum. Correlate with CT.  Displaced right superior and inferior pubic rami fractures.  No definite right femur fracture.   Electronically Signed   By: Jearld Lesch  M.D.   On: 10/13/2013 01:13    EKG Interpretation   None       MDM  No diagnosis found. Right hip pain fall.  Xray to look for new fx.  Known pelvic fx hx.   Tylenol for pain, UA.  Glucose. NH says pt at baseline. Xray showed pubic rami fx, CT for further delineation.   Plan for admission.  The patients results and plan were reviewed and discussed.   Any x-rays performed were personally reviewed by myself.   Discussed with ortho on call, they will evaluate patient in the morning and look at the images.   Differential diagnosis were considered with the presenting HPI.  Diagnosis: Anemia, Fall, Right suprapubic rami fx Dementia   Admission/ observation were discussed with the admitting physician, patient and/or family and they are comfortable with the plan.    Enid Skeens, MD 10/13/13 [phone]

## 2013-10-13 NOTE — ED Notes (Signed)
Patient would not talk to me, other than to say "you can send me home now".   Patient stated she wasn't comfortable, but could not (or would not) tell me how I could help her get more comfortable.

## 2013-10-13 NOTE — ED Notes (Signed)
Patient transported to CT 

## 2013-10-13 NOTE — ED Notes (Addendum)
Pt. arrived with EMS from [facility] , slipped from her chair yesterday , sent here for evaluation , pt. Reports right hip pain for several months , respirations unlabored.

## 2013-10-13 NOTE — ED Provider Notes (Addendum)
Per SW, the patient is essentially wheelchair dependant.  She does ambulate a small amount, but for transfer only.   On re-check the patient is spontaneously standing, and transferring to her WC.  No new complaints.   Per PT, the patient is appropriate for d/c.  Patient will be returned to her assisted living facility.     Gerhard Munch, MD 10/13/13 [id number]  Gerhard Munch, MD 10/13/13 1350

## 2013-10-13 NOTE — ED Notes (Signed)
Report received, assumed care.  

## 2013-10-13 NOTE — ED Notes (Signed)
Placed call to [facility] for transportation back home 

## 2013-10-13 NOTE — ED Notes (Signed)
Patient returned from radiology

## 2014-03-11 ENCOUNTER — Ambulatory Visit: Payer: Self-pay | Admitting: Podiatrist

## 2014-03-30 ENCOUNTER — Ambulatory Visit: Payer: Self-pay | Admitting: Podiatrist

## 2014-08-17 ENCOUNTER — Encounter: Payer: Self-pay | Admitting: Internal Medicine

## 2014-10-16 ENCOUNTER — Emergency Department (HOSPITAL_COMMUNITY)
Admission: EM | Admit: 2014-10-16 | Discharge: 2014-10-16 | Disposition: A | Discharge status: Skilled Nursing Facility | Payer: Medicare Other | Attending: Emergency Medicine | Admitting: Emergency Medicine

## 2014-10-16 ENCOUNTER — Encounter (HOSPITAL_COMMUNITY): Payer: Self-pay | Admitting: Emergency Medicine

## 2014-10-16 ENCOUNTER — Emergency Department (HOSPITAL_COMMUNITY): Payer: Medicare Other

## 2014-10-16 DIAGNOSIS — Z880 Allergy status to penicillin: Secondary | ICD-10-CM | POA: Insufficient documentation

## 2014-10-16 DIAGNOSIS — G4700 Insomnia, unspecified: Secondary | ICD-10-CM | POA: Insufficient documentation

## 2014-10-16 DIAGNOSIS — Y92002 Bathroom of unspecified non-institutional (private) residence single-family (private) house as the place of occurrence of the external cause: Secondary | ICD-10-CM | POA: Insufficient documentation

## 2014-10-16 DIAGNOSIS — S52611A Displaced fracture of right ulna styloid process, initial encounter for closed fracture: Secondary | ICD-10-CM | POA: Diagnosis not present

## 2014-10-16 DIAGNOSIS — Z7951 Long term (current) use of inhaled steroids: Secondary | ICD-10-CM | POA: Diagnosis not present

## 2014-10-16 DIAGNOSIS — G710 Muscular dystrophy: Secondary | ICD-10-CM | POA: Diagnosis not present

## 2014-10-16 DIAGNOSIS — Z993 Dependence on wheelchair: Secondary | ICD-10-CM | POA: Diagnosis not present

## 2014-10-16 DIAGNOSIS — S6991XA Unspecified injury of right wrist, hand and finger(s), initial encounter: Secondary | ICD-10-CM | POA: Diagnosis present

## 2014-10-16 DIAGNOSIS — Z8739 Personal history of other diseases of the musculoskeletal system and connective tissue: Secondary | ICD-10-CM | POA: Insufficient documentation

## 2014-10-16 DIAGNOSIS — S52601A Unspecified fracture of lower end of right ulna, initial encounter for closed fracture: Secondary | ICD-10-CM | POA: Insufficient documentation

## 2014-10-16 DIAGNOSIS — D509 Iron deficiency anemia, unspecified: Secondary | ICD-10-CM | POA: Diagnosis not present

## 2014-10-16 DIAGNOSIS — T1490XA Injury, unspecified, initial encounter: Secondary | ICD-10-CM

## 2014-10-16 DIAGNOSIS — Y998 Other external cause status: Secondary | ICD-10-CM | POA: Insufficient documentation

## 2014-10-16 DIAGNOSIS — W050XXA Fall from non-moving wheelchair, initial encounter: Secondary | ICD-10-CM | POA: Insufficient documentation

## 2014-10-16 DIAGNOSIS — Z8673 Personal history of transient ischemic attack (TIA), and cerebral infarction without residual deficits: Secondary | ICD-10-CM | POA: Insufficient documentation

## 2014-10-16 DIAGNOSIS — K219 Gastro-esophageal reflux disease without esophagitis: Secondary | ICD-10-CM | POA: Diagnosis not present

## 2014-10-16 DIAGNOSIS — Z79899 Other long term (current) drug therapy: Secondary | ICD-10-CM | POA: Diagnosis not present

## 2014-10-16 DIAGNOSIS — F329 Major depressive disorder, single episode, unspecified: Secondary | ICD-10-CM | POA: Diagnosis not present

## 2014-10-16 DIAGNOSIS — J45909 Unspecified asthma, uncomplicated: Secondary | ICD-10-CM | POA: Diagnosis not present

## 2014-10-16 DIAGNOSIS — F419 Anxiety disorder, unspecified: Secondary | ICD-10-CM | POA: Insufficient documentation

## 2014-10-16 DIAGNOSIS — Y9389 Activity, other specified: Secondary | ICD-10-CM | POA: Diagnosis not present

## 2014-10-16 DIAGNOSIS — S52501A Unspecified fracture of the lower end of right radius, initial encounter for closed fracture: Secondary | ICD-10-CM

## 2014-10-16 MED ORDER — TRAMADOL HCL 50 MG PO TABS: 50.0000 mg | ORAL_TABLET | Freq: Four times a day (QID) | ORAL | Status: DC | PRN

## 2014-10-16 MED ORDER — TRAMADOL HCL 50 MG PO TABS
50.0000 mg | ORAL_TABLET | Freq: Once | ORAL | Status: AC
  Administered 2014-10-16: 50 mg via ORAL
  Filled 2014-10-16: qty 1

## 2014-10-16 NOTE — ED Notes (Signed)
Called and reported to [place] at [facility].

## 2014-10-16 NOTE — ED Notes (Signed)
Bed: [facility] Expected date:  Expected time:  Means of arrival:  Comments: EMS 79F SNF fall from wheelchair

## 2014-10-16 NOTE — ED Notes (Signed)
Called communication for transport to facility via [place].

## 2014-10-16 NOTE — ED Notes (Signed)
Awake. Verbally responsive. A/O x4. Resp even and unlabored. No audible adventitious breath sounds noted. ABC's intact. NAD noted. 

## 2014-10-16 NOTE — ED Notes (Signed)
Pt assisted to bathroom. Gait steady. NAD noted.

## 2014-10-16 NOTE — ED Provider Notes (Signed)
CSN: [id number]     Arrival date & time 10/16/14  0403 History   First MD Initiated Contact with Patient 10/16/14 0424     Chief Complaint  Patient presents with  . Wrist Injury     (Consider location/radiation/quality/duration/timing/severity/associated sxs/prior Treatment) HPI This is an 80 year old female who slipped off her wheelchair in the bathroom just prior to arrival. She landed on her right upper extremity and has moderate to severe pain in her right wrist. There is an associated deformity. There is no functional deficit or sensory deficit distal to the injury. She denies other injury. Pain is worse with palpation or movement  Past Medical History  Diagnosis Date  . Diverticulosis   . Esophageal stricture   . Hiatal hernia   . Iron deficiency anemia   . Depression   . Anxiety   . Arthritis   . Asthma   . Gastric ulcer 1994  . Partial small bowel obstruction 1995  . IBS (irritable bowel syndrome)   . GERD (gastroesophageal reflux disease)   . Osteoporosis   . Insomnia   . Macular degeneration   . Seborrheic keratosis   . Stroke    Past Surgical History  Procedure Laterality Date  . Cholecystectomy    . Appendectomy    . Tonsillectomy    . Spine surgery    . Bunionectomy    . Sinus surgery with instatrak     Family History  Problem Relation Age of Onset  . Ovarian cancer Mother   . Heart disease    . Macular degeneration    . Colon cancer Neg Hx    History  Substance Use Topics  . Smoking status: Never Smoker   . Smokeless tobacco: Never Used  . Alcohol Use: Yes     Comment: not often    OB History    No data available     Review of Systems  All other systems reviewed and are negative.   Allergies  Iohexol; Milk-related compounds; and Penicillins  Home Medications   Prior to Admission medications   Medication Sig Start Date End Date Taking? Authorizing Provider  FLUoxetine (PROZAC) 20 MG capsule Take 20 mg by mouth daily.    Historical  Provider, MD  Fluticasone-Salmeterol (ADVAIR) 100-50 MCG/DOSE AEPB Inhale 1 puff into the lungs daily.    Historical Provider, MD  iron polysaccharides (NIFEREX) 150 MG capsule Take 150 mg by mouth 2 (two) times daily.      Historical Provider, MD  levothyroxine (SYNTHROID, LEVOTHROID) 75 MCG tablet Take 75 mcg by mouth daily before breakfast.    Historical Provider, MD  LORazepam (ATIVAN) 0.5 MG tablet Take 0.5 mg by mouth 2 (two) times daily.     Historical Provider, MD  memantine (NAMENDA) 10 MG tablet Take 10 mg by mouth 2 (two) times daily.    Historical Provider, MD  mirtazapine (REMERON) 15 MG tablet Take 15 mg by mouth at bedtime.      Historical Provider, MD  omeprazole (PRILOSEC) 20 MG capsule Take 20 mg by mouth daily.    Historical Provider, MD  simvastatin (ZOCOR) 20 MG tablet Take 20 mg by mouth every evening.    Historical Provider, MD  traMADol (ULTRAM) 50 MG tablet Take 1 tablet (50 mg total) by mouth every 6 (six) hours as needed (takes 2 scheduled doses 8am and 8pm   may have additional doses if needed for pain). 10/13/13   Carmin Muskrat, MD   BP 166/59 mmHg  Pulse 88  Temp(Src) 98.1 F (36.7 C) (Oral)  Resp 19  SpO2 99%   Physical Exam  General: Well-developed, well-nourished female in no acute distress; appearance consistent with age of record HENT: normocephalic; atraumatic Eyes: pupils equal, round and reactive to light; extraocular muscles intact Neck: supple; nontender Heart: regular rate and rhythm; faint systolic murmur at right upper sternal border Lungs: clear to auscultation bilaterally Abdomen: soft; nondistended; nontender; bowel sounds present Extremities: Deformity and tenderness of right wrist, right hand distally neurovascularly intact with intact tendon function; left upper extremity and lower extremities unremarkable except for chronic appearing arthritic changes Neurologic: Awake, alert and oriented; motor function intact in all extremities and  symmetric; no facial droop Skin: Warm and dry Psychiatric: Normal mood and affect    ED Course  Procedures (including critical care time)   MDM  Nursing notes and vitals signs, including pulse oximetry, reviewed.  Summary of this visit's results, reviewed by myself:  Imaging Studies: Dg Wrist Complete Right  10/16/2014   CLINICAL DATA:  Acute onset of right wrist pain, after slipping in bathroom and falling. Initial encounter.  EXAM: RIGHT WRIST - COMPLETE 3+ VIEW  COMPARISON:  Right wrist radiographs performed 02/07/2013  FINDINGS: There is a mildly impacted fracture involving the distal radial metaphysis, with ulnar and dorsal angulation. There is extension to the radiocarpal joint. A minimally displaced ulnar styloid fracture is also seen.  Mild degenerative change is noted at the first carpometacarpal joint, with scattered surrounding degenerative osseous fragments. Mild degenerative change is also noted at the radial aspect of the carpal rows. The carpal rows are otherwise grossly intact. Soft tissue swelling is noted about the fracture sites.  IMPRESSION: 1. Mildly impacted fracture involving the distal radial metaphysis, with ulnar and dorsal angulation, and intra-articular extension to the radiocarpal joint. 2. Minimally displaced ulnar styloid fracture noted.   Electronically Signed   By: Garald Balding M.D.   On: 10/16/2014 05:32      Wynetta Fines, MD 10/16/14 [phone]

## 2014-10-16 NOTE — ED Notes (Signed)
Per EMS, pt. From [facility] with complaint of fall at 0330 this morning, pt. Stated that she slipped off from her wheelchair while going to the bathroom, denied LOC., claimed of pain 5/5 on her right wrist, alert and oriented x3.

## 2014-10-16 NOTE — ED Notes (Signed)
EMS arrived to transport pt back to ALF.

## 2015-02-15 ENCOUNTER — Encounter (HOSPITAL_COMMUNITY)
Admission: RE | Admit: 2015-02-15 | Discharge: 2015-02-15 | Disposition: A | Discharge status: Home / Self Care | Payer: Medicare Other | Source: Ambulatory Visit | Attending: Internal Medicine | Admitting: Internal Medicine

## 2015-02-15 DIAGNOSIS — M810 Age-related osteoporosis without current pathological fracture: Secondary | ICD-10-CM | POA: Insufficient documentation

## 2015-02-15 MED ORDER — DENOSUMAB 60 MG/ML ~~LOC~~ SOLN
60.0000 mg | Freq: Once | SUBCUTANEOUS | Status: AC
  Administered 2015-02-15: 60 mg via SUBCUTANEOUS
  Filled 2015-02-15: qty 1

## 2015-04-12 ENCOUNTER — Emergency Department (HOSPITAL_COMMUNITY)
Admission: EM | Admit: 2015-04-12 | Discharge: 2015-04-12 | Disposition: A | Discharge status: Home / Self Care | Payer: Medicare Other | Attending: Emergency Medicine | Admitting: Emergency Medicine

## 2015-04-12 ENCOUNTER — Encounter (HOSPITAL_COMMUNITY): Payer: Self-pay

## 2015-04-12 ENCOUNTER — Emergency Department (HOSPITAL_COMMUNITY): Payer: Medicare Other

## 2015-04-12 DIAGNOSIS — W109XXA Fall (on) (from) unspecified stairs and steps, initial encounter: Secondary | ICD-10-CM | POA: Diagnosis not present

## 2015-04-12 DIAGNOSIS — Z8673 Personal history of transient ischemic attack (TIA), and cerebral infarction without residual deficits: Secondary | ICD-10-CM | POA: Diagnosis not present

## 2015-04-12 DIAGNOSIS — F419 Anxiety disorder, unspecified: Secondary | ICD-10-CM | POA: Insufficient documentation

## 2015-04-12 DIAGNOSIS — Z872 Personal history of diseases of the skin and subcutaneous tissue: Secondary | ICD-10-CM | POA: Insufficient documentation

## 2015-04-12 DIAGNOSIS — W19XXXA Unspecified fall, initial encounter: Secondary | ICD-10-CM

## 2015-04-12 DIAGNOSIS — K219 Gastro-esophageal reflux disease without esophagitis: Secondary | ICD-10-CM | POA: Insufficient documentation

## 2015-04-12 DIAGNOSIS — S80211A Abrasion, right knee, initial encounter: Secondary | ICD-10-CM | POA: Diagnosis not present

## 2015-04-12 DIAGNOSIS — S8991XA Unspecified injury of right lower leg, initial encounter: Secondary | ICD-10-CM | POA: Diagnosis present

## 2015-04-12 DIAGNOSIS — Z7951 Long term (current) use of inhaled steroids: Secondary | ICD-10-CM | POA: Insufficient documentation

## 2015-04-12 DIAGNOSIS — D509 Iron deficiency anemia, unspecified: Secondary | ICD-10-CM | POA: Diagnosis not present

## 2015-04-12 DIAGNOSIS — Y998 Other external cause status: Secondary | ICD-10-CM | POA: Diagnosis not present

## 2015-04-12 DIAGNOSIS — Z8739 Personal history of other diseases of the musculoskeletal system and connective tissue: Secondary | ICD-10-CM | POA: Diagnosis not present

## 2015-04-12 DIAGNOSIS — F329 Major depressive disorder, single episode, unspecified: Secondary | ICD-10-CM | POA: Insufficient documentation

## 2015-04-12 DIAGNOSIS — F0390 Unspecified dementia without behavioral disturbance: Secondary | ICD-10-CM | POA: Diagnosis not present

## 2015-04-12 DIAGNOSIS — Y9301 Activity, walking, marching and hiking: Secondary | ICD-10-CM | POA: Diagnosis not present

## 2015-04-12 DIAGNOSIS — J45909 Unspecified asthma, uncomplicated: Secondary | ICD-10-CM | POA: Diagnosis not present

## 2015-04-12 DIAGNOSIS — Y929 Unspecified place or not applicable: Secondary | ICD-10-CM | POA: Diagnosis not present

## 2015-04-12 DIAGNOSIS — Z8669 Personal history of other diseases of the nervous system and sense organs: Secondary | ICD-10-CM | POA: Diagnosis not present

## 2015-04-12 DIAGNOSIS — Z79899 Other long term (current) drug therapy: Secondary | ICD-10-CM | POA: Insufficient documentation

## 2015-04-12 DIAGNOSIS — Z880 Allergy status to penicillin: Secondary | ICD-10-CM | POA: Diagnosis not present

## 2015-04-12 NOTE — ED Provider Notes (Signed)
CSN: [id number]     Arrival date & time 04/12/15  1619 History   First MD Initiated Contact with Patient 04/12/15 1631     Chief Complaint  Patient presents with  . Fall  . Knee Pain     (Consider location/radiation/quality/duration/timing/severity/associated sxs/prior Treatment) Patient is a 81 y.o. female presenting with fall and knee pain.  Fall  Knee Pain   SEHER SCHLAGEL is a 81 y.o. female presenting today after a fall. Patient is unable to give me a good history, she states she was simply being down and that her knee hurts. Her right knee. Patient is coming from a nursing staff. Per EMS, they reported that she was in her wheelchair and started walking down 2 steps and fell forward on her knees. There is an abrasion on her right knee. C-collar was applied. There is no history of hitting her head or LOC. Patient states she otherwise feels well. She has no complaints for me.   Past Medical History  Diagnosis Date  . Diverticulosis   . Esophageal stricture   . Hiatal hernia   . Iron deficiency anemia   . Depression   . Anxiety   . Arthritis   . Asthma   . Gastric ulcer 1994  . Partial small bowel obstruction 1995  . IBS (irritable bowel syndrome)   . GERD (gastroesophageal reflux disease)   . Osteoporosis   . Insomnia   . Macular degeneration   . Seborrheic keratosis   . Stroke    Past Surgical History  Procedure Laterality Date  . Cholecystectomy    . Appendectomy    . Tonsillectomy    . Spine surgery    . Bunionectomy    . Sinus surgery with instatrak     Family History  Problem Relation Age of Onset  . Ovarian cancer Mother   . Heart disease    . Macular degeneration    . Colon cancer Neg Hx    History  Substance Use Topics  . Smoking status: Never Smoker   . Smokeless tobacco: Never Used  . Alcohol Use: Yes     Comment: not often    OB History    No data available     Review of Systems  Unable to perform ROS: Dementia      Allergies   Iohexol; Milk-related compounds; and Penicillins  Home Medications   Prior to Admission medications   Medication Sig Start Date End Date Taking? Authorizing Provider  atorvastatin (LIPITOR) 10 MG tablet Take 10 mg by mouth daily.   Yes Historical Provider, MD  FLUoxetine (PROZAC) 20 MG capsule Take 20 mg by mouth daily.   Yes Historical Provider, MD  Fluticasone Furoate-Vilanterol 100-25 MCG/INH AEPB Inhale 1 puff into the lungs daily.   Yes Historical Provider, MD  iron polysaccharides (NIFEREX) 150 MG capsule Take 150 mg by mouth 2 (two) times daily.     Yes Historical Provider, MD  levothyroxine (SYNTHROID, LEVOTHROID) 75 MCG tablet Take 75 mcg by mouth daily before breakfast.   Yes Historical Provider, MD  loratadine (CLARITIN) 10 MG tablet Take 10 mg by mouth daily.   Yes Historical Provider, MD  LORazepam (ATIVAN) 0.5 MG tablet Take 0.5 mg by mouth 2 (two) times daily.    Yes Historical Provider, MD  Memantine HCl ER (NAMENDA XR) 28 MG CP24 Take 28 mg by mouth daily.   Yes Historical Provider, MD  Multiple Vitamins-Minerals (PRESERVISION AREDS 2 PO) Take 1 capsule by mouth 2 (  two) times daily.    Yes Historical Provider, MD  omeprazole (PRILOSEC) 20 MG capsule Take 20 mg by mouth daily.   Yes Historical Provider, MD  Vitamin D, Ergocalciferol, (DRISDOL) 50000 UNITS CAPS capsule Take 50,000 Units by mouth every 7 (seven) days.   Yes Historical Provider, MD  Fluticasone-Salmeterol (ADVAIR) 100-50 MCG/DOSE AEPB Inhale 1 puff into the lungs daily.    Historical Provider, MD  guaiFENesin (ROBITUSSIN) 100 MG/5ML SOLN Take 5 mLs by mouth every 4 (four) hours as needed for cough or to loosen phlegm.    Historical Provider, MD  hydroxypropyl methylcellulose / hypromellose (ISOPTO TEARS / GONIOVISC) 2.5 % ophthalmic solution Place 1 drop into both eyes 4 (four) times daily as needed for dry eyes.    Historical Provider, MD  memantine (NAMENDA) 10 MG tablet Take 10 mg by mouth 2 (two) times daily.     Historical Provider, MD  mirtazapine (REMERON) 15 MG tablet Take 15 mg by mouth at bedtime.      Historical Provider, MD  simvastatin (ZOCOR) 20 MG tablet Take 20 mg by mouth every evening.    Historical Provider, MD  traMADol (ULTRAM) 50 MG tablet Take 1 tablet (50 mg total) by mouth every 6 (six) hours as needed (for pain). 10/16/14   John Molpus, MD   BP 171/65 mmHg  Pulse 79  Temp(Src) 98.6 F (37 C) (Oral)  SpO2 99% Physical Exam  Constitutional: She is oriented to person, place, and time. She appears well-developed and well-nourished. No distress.  HENT:  Head: Normocephalic and atraumatic.  Nose: Nose normal.  Mouth/Throat: Oropharynx is clear and moist. No oropharyngeal exudate.  Eyes: Conjunctivae and EOM are normal. Pupils are equal, round, and reactive to light. No scleral icterus.  Neck: Normal range of motion. Neck supple. No JVD present. No tracheal deviation present. No thyromegaly present.  Cardiovascular: Normal rate, regular rhythm and normal heart sounds.  Exam reveals no gallop and no friction rub.   No murmur heard. Pulmonary/Chest: Effort normal and breath sounds normal. No respiratory distress. She has no wheezes. She exhibits no tenderness.  Abdominal: Soft. Bowel sounds are normal. She exhibits no distension and no mass. There is no tenderness. There is no rebound and no guarding.  Musculoskeletal: Normal range of motion. She exhibits no edema or tenderness.  Small abrasions seen over right knee. No tenderness to palpation. Full range of motion. Normal pulses and sensation distally.  Lymphadenopathy:    She has no cervical adenopathy.  Neurological: She is alert and oriented to person, place, and time. No cranial nerve deficit. She exhibits normal muscle tone.  Normal strength and sensation in 4 extremities, normal cerebellar testing.  Skin: Skin is warm and dry. No rash noted. She is not diaphoretic. No erythema. No pallor.  Nursing note and vitals  reviewed.   ED Course  Procedures (including critical care time) Labs Review Labs Reviewed - No data to display  Imaging Review Ct Head Wo Contrast  04/12/2015   CLINICAL DATA:  Fall from wheelchair.  C-collar applied  EXAM: CT HEAD WITHOUT CONTRAST  CT CERVICAL SPINE WITHOUT CONTRAST  TECHNIQUE: Multidetector CT imaging of the head and cervical spine was performed following the standard protocol without intravenous contrast. Multiplanar CT image reconstructions of the cervical spine were also generated.  COMPARISON:  Head and neck CT 08/24/2013  FINDINGS: CT HEAD FINDINGS  No intracranial hemorrhage. No parenchymal contusion. No midline shift or mass effect. Basilar cisterns are patent. No skull base fracture.  No fluid in the paranasal sinuses or mastoid air cells. Orbits are normal.  There is generalized cortical atrophy and ventricular dilatation measured proportional. Mild periventricular white matter hypodensities.  CT CERVICAL SPINE FINDINGS  No prevertebral soft tissue swelling. Normal alignment of cervical vertebral bodies. No loss of vertebral body height. Normal facet articulation. Normal craniocervical junction.  No evidence epidural or paraspinal hematoma.  There is endplate spurring and joint space narrowing from C5 through C7. Unchanged from prior.  There is bilateral apical scarring similar prior.  IMPRESSION: 1. No intracranial trauma. 2. Atrophy and chronic white matter microvascular disease. 3. No cervical spine fracture. 4. Multilevel disc osteophytic disease.   Electronically Signed   By: Suzy Bouchard M.D.   On: 04/12/2015 17:39   Ct Cervical Spine Wo Contrast  04/12/2015   CLINICAL DATA:  Fall from wheelchair.  C-collar applied  EXAM: CT HEAD WITHOUT CONTRAST  CT CERVICAL SPINE WITHOUT CONTRAST  TECHNIQUE: Multidetector CT imaging of the head and cervical spine was performed following the standard protocol without intravenous contrast. Multiplanar CT image reconstructions of the  cervical spine were also generated.  COMPARISON:  Head and neck CT 08/24/2013  FINDINGS: CT HEAD FINDINGS  No intracranial hemorrhage. No parenchymal contusion. No midline shift or mass effect. Basilar cisterns are patent. No skull base fracture. No fluid in the paranasal sinuses or mastoid air cells. Orbits are normal.  There is generalized cortical atrophy and ventricular dilatation measured proportional. Mild periventricular white matter hypodensities.  CT CERVICAL SPINE FINDINGS  No prevertebral soft tissue swelling. Normal alignment of cervical vertebral bodies. No loss of vertebral body height. Normal facet articulation. Normal craniocervical junction.  No evidence epidural or paraspinal hematoma.  There is endplate spurring and joint space narrowing from C5 through C7. Unchanged from prior.  There is bilateral apical scarring similar prior.  IMPRESSION: 1. No intracranial trauma. 2. Atrophy and chronic white matter microvascular disease. 3. No cervical spine fracture. 4. Multilevel disc osteophytic disease.   Electronically Signed   By: Suzy Bouchard M.D.   On: 04/12/2015 17:39   Dg Knee Complete 4 Views Right  04/12/2015   CLINICAL DATA:  81 year old female status post fall down to stairs out of her wheelchair  EXAM: RIGHT KNEE - COMPLETE 4+ VIEW  COMPARISON:  None.  FINDINGS: There is no evidence of fracture, dislocation, or joint effusion. There is no evidence of arthropathy or other focal bone abnormality. Soft tissues are unremarkable.  IMPRESSION: Negative.   Electronically Signed   By: Jacqulynn Cadet M.D.   On: 04/12/2015 17:21     EKG Interpretation None      MDM   Final diagnoses:  Fall  Fall  Fall    Patient presents to the emergency department after a fall. Per EMS states she landed on her knees. I cannot obtain a good history from the patient in a c-collar is applied. Will obtain x-ray of the knee and CT of the head and neck for evaluation. Patient's tetanus shot is  up-to-date.  CT scan and x-rays are negative for significant injury. Patient will be discharged home with close follow-up with her primary care physician within 3 days. She overall appears well in no acute distress. Her cervical collar was cleared. Her vital signs were within her normal limits and she is safe for discharge.   Everlene Balls, MD 04/12/15 [phone]

## 2015-04-12 NOTE — ED Notes (Signed)
A call placed to [facility] for transportation back to [place] on [address].

## 2015-04-12 NOTE — ED Notes (Signed)
Per EMS- Patient is from [facility]. Staff report that the patient was in her wheelchair and started down 2 steps in her wheelchair and fell forward landing on her knees. Patient has an abrasionn to the left knee. c-collar applied.

## 2015-04-12 NOTE — Discharge Instructions (Signed)
Fall Prevention and Home Safety Ms. Javid, your CT scans and x-rays were negative. See your primary care physician within 3 days for close follow-up regarding preventing falls at home. If any symptoms worsen come back to emergency department immediately. Thank you. Falls cause injuries and can affect all age groups. It is possible to prevent falls.  HOW TO PREVENT FALLS  Wear shoes with rubber soles that do not have an opening for your toes.  Keep the inside and outside of your house well lit.  Use night lights throughout your home.  Remove clutter from floors.  Clean up floor spills.  Remove throw rugs or fasten them to the floor with carpet tape.  Do not place electrical cords across pathways.  Put grab bars by your tub, shower, and toilet. Do not use towel bars as grab bars.  Put handrails on both sides of the stairway. Fix loose handrails.  Do not climb on stools or stepladders, if possible.  Do not wax your floors.  Repair uneven or unsafe sidewalks, walkways, or stairs.  Keep items you use a lot within reach.  Be aware of pets.  Keep emergency numbers next to the telephone.  Put smoke detectors in your home and near bedrooms. Ask your doctor what other things you can do to prevent falls. Document Released: 08/24/2009 Document Revised: 04/28/2012 Document Reviewed: 01/28/2012 [facility] Patient Information 2015 [place], [place]. This information is not intended to replace advice given to you by your health care provider. Make sure you discuss any questions you have with your health care provider. Abrasions An abrasion is a cut or scrape of the skin. Abrasions do not go through all layers of the skin. HOME CARE  If a bandage (dressing) was put on your wound, change it as told by your doctor. If the bandage sticks, soak it off with warm.  Wash the area with water and soap 2 times a day. Rinse off the soap. Pat the area dry with a clean towel.  Put on medicated cream  (ointment) as told by your doctor.  Change your bandage right away if it gets wet or dirty.  Only take medicine as told by your doctor.  See your doctor within 24-48 hours to get your wound checked.  Check your wound for redness, puffiness (swelling), or yellowish-white fluid (pus). GET HELP RIGHT AWAY IF:   You have more pain in the wound.  You have redness, swelling, or tenderness around the wound.  You have pus coming from the wound.  You have a fever or lasting symptoms for more than 2-3 days.  You have a fever and your symptoms suddenly get worse.  You have a bad smell coming from the wound or bandage. MAKE SURE YOU:   Understand these instructions.  Will watch your condition.  Will get help right away if you are not doing well or get worse. Document Released: 04/15/2008 Document Revised: 07/22/2012 Document Reviewed: 10/01/2011 [facility] Patient Information 2015 [place], [place]. This information is not intended to replace advice given to you by your health care provider. Make sure you discuss any questions you have with your health care provider.

## 2015-04-12 NOTE — ED Notes (Signed)
[facility] cancelled. Patient's daughter is here to take the patient home.

## 2015-04-12 NOTE — ED Notes (Signed)
Bed: WA03 Expected date:  Expected time:  Means of arrival:  Comments: EMS FALL

## 2015-07-12 ENCOUNTER — Emergency Department (HOSPITAL_COMMUNITY): Payer: Medicare Other

## 2015-07-12 ENCOUNTER — Emergency Department (HOSPITAL_COMMUNITY)
Admission: EM | Admit: 2015-07-12 | Discharge: 2015-07-12 | Disposition: A | Discharge status: Home / Self Care | Payer: Medicare Other | Attending: Emergency Medicine | Admitting: Emergency Medicine

## 2015-07-12 ENCOUNTER — Encounter (HOSPITAL_COMMUNITY): Payer: Self-pay | Admitting: Emergency Medicine

## 2015-07-12 DIAGNOSIS — F329 Major depressive disorder, single episode, unspecified: Secondary | ICD-10-CM | POA: Insufficient documentation

## 2015-07-12 DIAGNOSIS — Y9389 Activity, other specified: Secondary | ICD-10-CM | POA: Diagnosis not present

## 2015-07-12 DIAGNOSIS — Z8739 Personal history of other diseases of the musculoskeletal system and connective tissue: Secondary | ICD-10-CM | POA: Insufficient documentation

## 2015-07-12 DIAGNOSIS — Z880 Allergy status to penicillin: Secondary | ICD-10-CM | POA: Insufficient documentation

## 2015-07-12 DIAGNOSIS — F419 Anxiety disorder, unspecified: Secondary | ICD-10-CM | POA: Diagnosis not present

## 2015-07-12 DIAGNOSIS — Z87440 Personal history of urinary (tract) infections: Secondary | ICD-10-CM | POA: Insufficient documentation

## 2015-07-12 DIAGNOSIS — Z872 Personal history of diseases of the skin and subcutaneous tissue: Secondary | ICD-10-CM | POA: Diagnosis not present

## 2015-07-12 DIAGNOSIS — Y998 Other external cause status: Secondary | ICD-10-CM | POA: Diagnosis not present

## 2015-07-12 DIAGNOSIS — Z8669 Personal history of other diseases of the nervous system and sense organs: Secondary | ICD-10-CM | POA: Insufficient documentation

## 2015-07-12 DIAGNOSIS — K219 Gastro-esophageal reflux disease without esophagitis: Secondary | ICD-10-CM | POA: Insufficient documentation

## 2015-07-12 DIAGNOSIS — W1839XA Other fall on same level, initial encounter: Secondary | ICD-10-CM | POA: Insufficient documentation

## 2015-07-12 DIAGNOSIS — Z79899 Other long term (current) drug therapy: Secondary | ICD-10-CM | POA: Diagnosis not present

## 2015-07-12 DIAGNOSIS — Z862 Personal history of diseases of the blood and blood-forming organs and certain disorders involving the immune mechanism: Secondary | ICD-10-CM | POA: Insufficient documentation

## 2015-07-12 DIAGNOSIS — J45909 Unspecified asthma, uncomplicated: Secondary | ICD-10-CM | POA: Diagnosis not present

## 2015-07-12 DIAGNOSIS — Z8673 Personal history of transient ischemic attack (TIA), and cerebral infarction without residual deficits: Secondary | ICD-10-CM | POA: Diagnosis not present

## 2015-07-12 DIAGNOSIS — Y92129 Unspecified place in nursing home as the place of occurrence of the external cause: Secondary | ICD-10-CM | POA: Insufficient documentation

## 2015-07-12 DIAGNOSIS — S8991XA Unspecified injury of right lower leg, initial encounter: Secondary | ICD-10-CM | POA: Insufficient documentation

## 2015-07-12 DIAGNOSIS — W19XXXA Unspecified fall, initial encounter: Secondary | ICD-10-CM

## 2015-07-12 HISTORY — DX: Unspecified dementia, unspecified severity, without behavioral disturbance, psychotic disturbance, mood disturbance, and anxiety: F03.90

## 2015-07-12 NOTE — ED Notes (Signed)
Pt transported to xray 

## 2015-07-12 NOTE — ED Notes (Signed)
Per EMS:  From [facility], EMS was called by staff at facility after another resident at the facility saw the patient get up out of her wheelchair and fall.  Patient is wheelchair bound and has a hx of frequent falls.  Patient's only complaint at the moment is right knee pain.  Patient appears confused at times but is being treated for a UTI.

## 2015-07-12 NOTE — ED Notes (Signed)
[facility] called, patient dressed, ready to go.

## 2015-07-12 NOTE — Discharge Instructions (Signed)
As discussed, it is normal to feel worse in the days immediately following a fall regardless of medication use. ° °However, please take all medication as directed, use ice packs liberally.  If you develop any new, or concerning changes in your condition, please return here for further evaluation and management.   ° °Otherwise, please return followup with your physician ° ° ° °

## 2015-07-12 NOTE — ED Provider Notes (Signed)
CSN: [id number]     Arrival date & time 07/12/15  1444 History   First MD Initiated Contact with Patient 07/12/15 1505     Chief Complaint  Patient presents with  . Fall  . Knee Pain     (Consider location/radiation/quality/duration/timing/severity/associated sxs/prior Treatment) HPI This elderly female presents after a fall. Patient is a nursing home resident, has some ongoing confusion. Patient was found on the ground next to her wheelchair after a presumed fall, just prior to EMS arrival. Patient states that her knee hurts, denies head pain, neck pain, any other complaints. Per report patient was hemodynamically stable in transfer. No nursing home report of recent other falls, though she does have a noted history of frequent falls, and is currently being treated for a urinary tract infection. Past Medical History  Diagnosis Date  . Diverticulosis   . Esophageal stricture   . Hiatal hernia   . Iron deficiency anemia   . Depression   . Anxiety   . Arthritis   . Asthma   . Gastric ulcer 1994  . Partial small bowel obstruction 1995  . IBS (irritable bowel syndrome)   . GERD (gastroesophageal reflux disease)   . Osteoporosis   . Insomnia   . Macular degeneration   . Seborrheic keratosis   . Stroke    Past Surgical History  Procedure Laterality Date  . Cholecystectomy    . Appendectomy    . Tonsillectomy    . Spine surgery    . Bunionectomy    . Sinus surgery with instatrak     Family History  Problem Relation Age of Onset  . Ovarian cancer Mother   . Heart disease    . Macular degeneration    . Colon cancer Neg Hx    Social History  Substance Use Topics  . Smoking status: Never Smoker   . Smokeless tobacco: Never Used  . Alcohol Use: Yes     Comment: not often    OB History    No data available     Review of Systems  Constitutional:       Per HPI, otherwise negative  HENT:       Per HPI, otherwise negative  Respiratory:       Per HPI, otherwise  negative  Cardiovascular:       Per HPI, otherwise negative  Gastrointestinal: Negative for vomiting.  Endocrine:       Negative aside from HPI  Genitourinary:       Neg aside from HPI   Musculoskeletal:       Per HPI, otherwise negative  Skin: Negative for wound.  Neurological: Positive for weakness. Negative for syncope.       Gait instability  Psychiatric/Behavioral: Positive for confusion.      Allergies  Iohexol; Milk-related compounds; and Penicillins  Home Medications   Prior to Admission medications   Medication Sig Start Date End Date Taking? Authorizing Provider  acetaminophen (TYLENOL) 325 MG tablet Take 650 mg by mouth every 6 (six) hours as needed for moderate pain or fever.   Yes Historical Provider, MD  atorvastatin (LIPITOR) 10 MG tablet Take 10 mg by mouth daily at 6 PM.    Yes Historical Provider, MD  ciprofloxacin (CIPRO) 250 MG tablet Take 250 mg by mouth 2 (two) times daily. Stop 07/13/15 evening   Yes Historical Provider, MD  clotrimazole (LOTRIMIN) 1 % cream Apply 1 application topically 2 (two) times daily as needed (rash).   Yes Historical Provider,  MD  FLUoxetine (PROZAC) 20 MG capsule Take 20 mg by mouth daily.   Yes Historical Provider, MD  Fluticasone Furoate-Vilanterol 100-25 MCG/INH AEPB Inhale 1 puff into the lungs daily.   Yes Historical Provider, MD  guaifenesin (ROBITUSSIN) 100 MG/5ML syrup Take 100 mg by mouth 4 (four) times daily as needed for cough.   Yes Historical Provider, MD  iron polysaccharides (NIFEREX) 150 MG capsule Take 150 mg by mouth 2 (two) times daily.     Yes Historical Provider, MD  levothyroxine (SYNTHROID, LEVOTHROID) 75 MCG tablet Take 75 mcg by mouth daily before breakfast.   Yes Historical Provider, MD  loratadine (CLARITIN) 10 MG tablet Take 10 mg by mouth daily.   Yes Historical Provider, MD  LORazepam (ATIVAN) 0.5 MG tablet Take 0.5 mg by mouth 2 (two) times daily. May take another 1 tablet as needed for anxiety   Yes  Historical Provider, MD  Memantine HCl ER (NAMENDA XR) 28 MG CP24 Take 28 mg by mouth daily.   Yes Historical Provider, MD  mirtazapine (REMERON) 15 MG tablet Take 15 mg by mouth at bedtime.     Yes Historical Provider, MD  Multiple Vitamins-Minerals (PRESERVISION AREDS 2 PO) Take 1 capsule by mouth 2 (two) times daily.    Yes Historical Provider, MD  omeprazole (PRILOSEC) 20 MG capsule Take 20 mg by mouth daily.   Yes Historical Provider, MD  PRESCRIPTION MEDICATION Apply 1 application topically as needed (for itching to skin). TRIAMCINOLONE 0.1% AND HYDROCERIN 2:1 CREAM   Yes Historical Provider, MD  traMADol (ULTRAM) 50 MG tablet Take 1 tablet (50 mg total) by mouth every 6 (six) hours as needed (for pain). Patient taking differently: Take 50 mg by mouth every 8 (eight) hours as needed (for pain).  10/16/14  Yes John Molpus, MD  Triamcinolone Acetonide (TRIAMCINOLONE 0.1 % CREAM : EUCERIN) CREA Apply 1 application topically 3 (three) times daily as needed for itching.   Yes Historical Provider, MD  Vitamin D, Ergocalciferol, (DRISDOL) 50000 UNITS CAPS capsule Take 50,000 Units by mouth every 7 (seven) days.   Yes Historical Provider, MD   BP 172/57 mmHg  Pulse 79  Temp(Src) 97.8 F (36.6 C) (Oral)  Resp 22  SpO2 99% Physical Exam  Constitutional: She is oriented to person, place, and time. She appears cachectic. She appears ill.  HENT:  Head: Normocephalic and atraumatic.  Eyes: Conjunctivae and EOM are normal.  Neck: Neck supple. No spinous process tenderness and no muscular tenderness present.  Cardiovascular: Normal rate and regular rhythm.   Pulmonary/Chest: Effort normal and breath sounds normal. No stridor. No respiratory distress.  Abdominal: She exhibits no distension.  Genitourinary:  Adult diaper in place  Musculoskeletal: She exhibits no edema.       Left hip: Normal.       Right ankle: Normal.       Legs: Neurological: She is alert and oriented to person, place, and  time. No cranial nerve deficit.  Skin: Skin is warm and dry.  Psychiatric: She has a normal mood and affect.  Nursing note and vitals reviewed.   ED Course  Procedures (including critical care time) Labs Review Labs Reviewed - No data to display  Imaging Review Dg Knee Complete 4 Views Right  07/12/2015   CLINICAL DATA:  [place].  Right knee pain.  EXAM: RIGHT KNEE - COMPLETE 4+ VIEW  COMPARISON:  04/12/2015.  FINDINGS: The joint spaces are maintained. Minimal degenerative changes for age. No acute bony findings or  osteochondral abnormality. No chondrocalcinosis. No joint effusion.  IMPRESSION: No acute bony findings or significant degenerative changes.   Electronically Signed   By: Marijo Sanes M.D.   On: 07/12/2015 15:30   I have personally reviewed and evaluated these images and lab results as part of my medical decision-making.   MDM  Elderly F w baseline gait instability presents after a probable fall. No e/o Fx, and minimal complaints. Patient is HD stable, and was d/c in stable condition to her NH.  Carmin Muskrat, MD 07/12/15 [phone]

## 2015-10-14 ENCOUNTER — Emergency Department (HOSPITAL_COMMUNITY): Payer: Medicare Other

## 2015-10-14 ENCOUNTER — Inpatient Hospital Stay (HOSPITAL_COMMUNITY)
Admission: EM | Admit: 2015-10-14 | Discharge: 2015-10-20 | DRG: 392 | Disposition: A | Discharge status: Nursing Home (Includes ICF) | Payer: Medicare Other | Attending: Internal Medicine | Admitting: Internal Medicine

## 2015-10-14 ENCOUNTER — Encounter (HOSPITAL_COMMUNITY): Payer: Self-pay | Admitting: Emergency Medicine

## 2015-10-14 DIAGNOSIS — R339 Retention of urine, unspecified: Secondary | ICD-10-CM | POA: Diagnosis present

## 2015-10-14 DIAGNOSIS — E785 Hyperlipidemia, unspecified: Secondary | ICD-10-CM | POA: Diagnosis present

## 2015-10-14 DIAGNOSIS — N183 Chronic kidney disease, stage 3 unspecified: Secondary | ICD-10-CM | POA: Diagnosis present

## 2015-10-14 DIAGNOSIS — K449 Diaphragmatic hernia without obstruction or gangrene: Secondary | ICD-10-CM | POA: Diagnosis present

## 2015-10-14 DIAGNOSIS — Z79899 Other long term (current) drug therapy: Secondary | ICD-10-CM | POA: Diagnosis not present

## 2015-10-14 DIAGNOSIS — Z9049 Acquired absence of other specified parts of digestive tract: Secondary | ICD-10-CM

## 2015-10-14 DIAGNOSIS — Z880 Allergy status to penicillin: Secondary | ICD-10-CM

## 2015-10-14 DIAGNOSIS — M810 Age-related osteoporosis without current pathological fracture: Secondary | ICD-10-CM | POA: Diagnosis present

## 2015-10-14 DIAGNOSIS — Z8619 Personal history of other infectious and parasitic diseases: Secondary | ICD-10-CM | POA: Diagnosis present

## 2015-10-14 DIAGNOSIS — Z8719 Personal history of other diseases of the digestive system: Secondary | ICD-10-CM | POA: Diagnosis present

## 2015-10-14 DIAGNOSIS — F0390 Unspecified dementia without behavioral disturbance: Secondary | ICD-10-CM | POA: Diagnosis present

## 2015-10-14 DIAGNOSIS — R933 Abnormal findings on diagnostic imaging of other parts of digestive tract: Secondary | ICD-10-CM | POA: Diagnosis not present

## 2015-10-14 DIAGNOSIS — Z8711 Personal history of peptic ulcer disease: Secondary | ICD-10-CM

## 2015-10-14 DIAGNOSIS — M1990 Unspecified osteoarthritis, unspecified site: Secondary | ICD-10-CM | POA: Diagnosis present

## 2015-10-14 DIAGNOSIS — N3941 Urge incontinence: Secondary | ICD-10-CM | POA: Diagnosis present

## 2015-10-14 DIAGNOSIS — F329 Major depressive disorder, single episode, unspecified: Secondary | ICD-10-CM | POA: Diagnosis present

## 2015-10-14 DIAGNOSIS — Z8049 Family history of malignant neoplasm of other genital organs: Secondary | ICD-10-CM | POA: Diagnosis not present

## 2015-10-14 DIAGNOSIS — K219 Gastro-esophageal reflux disease without esophagitis: Secondary | ICD-10-CM | POA: Diagnosis present

## 2015-10-14 DIAGNOSIS — H3530 Unspecified macular degeneration: Secondary | ICD-10-CM | POA: Diagnosis present

## 2015-10-14 DIAGNOSIS — Z79891 Long term (current) use of opiate analgesic: Secondary | ICD-10-CM | POA: Diagnosis not present

## 2015-10-14 DIAGNOSIS — E039 Hypothyroidism, unspecified: Secondary | ICD-10-CM | POA: Diagnosis present

## 2015-10-14 DIAGNOSIS — E46 Unspecified protein-calorie malnutrition: Secondary | ICD-10-CM | POA: Diagnosis present

## 2015-10-14 DIAGNOSIS — F419 Anxiety disorder, unspecified: Secondary | ICD-10-CM | POA: Diagnosis present

## 2015-10-14 DIAGNOSIS — K6389 Other specified diseases of intestine: Secondary | ICD-10-CM | POA: Diagnosis present

## 2015-10-14 DIAGNOSIS — J45909 Unspecified asthma, uncomplicated: Secondary | ICD-10-CM | POA: Diagnosis present

## 2015-10-14 DIAGNOSIS — K529 Noninfective gastroenteritis and colitis, unspecified: Secondary | ICD-10-CM | POA: Diagnosis not present

## 2015-10-14 DIAGNOSIS — R109 Unspecified abdominal pain: Secondary | ICD-10-CM

## 2015-10-14 DIAGNOSIS — D509 Iron deficiency anemia, unspecified: Secondary | ICD-10-CM | POA: Diagnosis present

## 2015-10-14 DIAGNOSIS — D72829 Elevated white blood cell count, unspecified: Secondary | ICD-10-CM | POA: Diagnosis present

## 2015-10-14 DIAGNOSIS — Z91011 Allergy to milk products: Secondary | ICD-10-CM

## 2015-10-14 DIAGNOSIS — R269 Unspecified abnormalities of gait and mobility: Secondary | ICD-10-CM | POA: Diagnosis present

## 2015-10-14 DIAGNOSIS — R1900 Intra-abdominal and pelvic swelling, mass and lump, unspecified site: Secondary | ICD-10-CM

## 2015-10-14 DIAGNOSIS — N289 Disorder of kidney and ureter, unspecified: Secondary | ICD-10-CM

## 2015-10-14 DIAGNOSIS — R110 Nausea: Secondary | ICD-10-CM

## 2015-10-14 DIAGNOSIS — R2681 Unsteadiness on feet: Secondary | ICD-10-CM | POA: Diagnosis present

## 2015-10-14 DIAGNOSIS — Z8673 Personal history of transient ischemic attack (TIA), and cerebral infarction without residual deficits: Secondary | ICD-10-CM

## 2015-10-14 DIAGNOSIS — Z91041 Radiographic dye allergy status: Secondary | ICD-10-CM | POA: Diagnosis not present

## 2015-10-14 DIAGNOSIS — K432 Incisional hernia without obstruction or gangrene: Secondary | ICD-10-CM | POA: Diagnosis present

## 2015-10-14 DIAGNOSIS — Z8041 Family history of malignant neoplasm of ovary: Secondary | ICD-10-CM | POA: Diagnosis not present

## 2015-10-14 DIAGNOSIS — F341 Dysthymic disorder: Secondary | ICD-10-CM | POA: Diagnosis present

## 2015-10-14 HISTORY — DX: Hypothyroidism, unspecified: E03.9

## 2015-10-14 HISTORY — DX: Chronic kidney disease, stage 3 (moderate): N18.3

## 2015-10-14 HISTORY — DX: Chronic kidney disease, stage 3 unspecified: N18.30

## 2015-10-14 HISTORY — DX: Hyperlipidemia, unspecified: E78.5

## 2015-10-14 LAB — COMPREHENSIVE METABOLIC PANEL
ALK PHOS: 84 U/L (ref 38–126)
ALT: 16 U/L (ref 14–54)
AST: 24 U/L (ref 15–41)
Albumin: 4 g/dL (ref 3.5–5.0)
Anion gap: 9 (ref 5–15)
BILIRUBIN TOTAL: 0.8 mg/dL (ref 0.3–1.2)
BUN: 26 mg/dL — AB (ref 6–20)
CALCIUM: 9 mg/dL (ref 8.9–10.3)
CO2: 27 mmol/L (ref 22–32)
CREATININE: 1.16 mg/dL — AB (ref 0.44–1.00)
Chloride: 106 mmol/L (ref 101–111)
GFR calc Af Amer: 48 mL/min — ABNORMAL LOW (ref >60)
GFR calc non Af Amer: 42 mL/min — ABNORMAL LOW (ref >60)
Glucose, Bld: 110 mg/dL — ABNORMAL HIGH (ref 65–99)
Potassium: 4.6 mmol/L (ref 3.5–5.1)
Sodium: 142 mmol/L (ref 135–145)
Total Protein: 6.8 g/dL (ref 6.5–8.1)

## 2015-10-14 LAB — URINALYSIS, ROUTINE W REFLEX MICROSCOPIC
Bilirubin Urine: NEGATIVE
GLUCOSE, UA: NEGATIVE mg/dL
HGB URINE DIPSTICK: NEGATIVE
KETONES UR: NEGATIVE mg/dL
Leukocytes, UA: NEGATIVE
Nitrite: NEGATIVE
PROTEIN: NEGATIVE mg/dL
Specific Gravity, Urine: 1.014 (ref 1.005–1.030)
pH: 5.5 (ref 5.0–8.0)

## 2015-10-14 LAB — CBC WITH DIFFERENTIAL/PLATELET
BASOS ABS: 0 10*3/uL (ref 0.0–0.1)
Basophils Relative: 0 %
Eosinophils Absolute: 0 10*3/uL (ref 0.0–0.7)
Eosinophils Relative: 0 %
HEMATOCRIT: 42.5 % (ref 36.0–46.0)
Hemoglobin: 13.5 g/dL (ref 12.0–15.0)
Lymphocytes Relative: 7 %
Lymphs Abs: 0.9 10*3/uL (ref 0.7–4.0)
MCH: 32.1 pg (ref 26.0–34.0)
MCHC: 31.8 g/dL (ref 30.0–36.0)
MCV: 101 fL — AB (ref 78.0–100.0)
MONO ABS: 0.5 10*3/uL (ref 0.1–1.0)
Monocytes Relative: 4 %
NEUTROS ABS: 11.9 10*3/uL — AB (ref 1.7–7.7)
Neutrophils Relative %: 89 %
Platelets: 225 10*3/uL (ref 150–400)
RBC: 4.21 MIL/uL (ref 3.87–5.11)
RDW: 13.1 % (ref 11.5–15.5)
WBC: 13.4 10*3/uL — AB (ref 4.0–10.5)

## 2015-10-14 LAB — LIPASE, BLOOD: Lipase: 54 U/L — ABNORMAL HIGH (ref 11–51)

## 2015-10-14 LAB — MAGNESIUM: MAGNESIUM: 2.2 mg/dL (ref 1.7–2.4)

## 2015-10-14 MED ORDER — ACETAMINOPHEN 325 MG PO TABS
650.0000 mg | ORAL_TABLET | Freq: Four times a day (QID) | ORAL | Status: DC | PRN
  Administered 2015-10-18 (×2): 650 mg via ORAL
  Filled 2015-10-14 (×2): qty 2

## 2015-10-14 MED ORDER — BUDESONIDE 0.25 MG/2ML IN SUSP
0.2500 mg | Freq: Two times a day (BID) | RESPIRATORY_TRACT | Status: DC
  Administered 2015-10-14 – 2015-10-20 (×11): 0.25 mg via RESPIRATORY_TRACT
  Filled 2015-10-14 (×12): qty 2

## 2015-10-14 MED ORDER — ONDANSETRON HCL 4 MG/2ML IJ SOLN
4.0000 mg | Freq: Once | INTRAMUSCULAR | Status: AC
  Administered 2015-10-14: 4 mg via INTRAVENOUS
  Filled 2015-10-14: qty 2

## 2015-10-14 MED ORDER — MEMANTINE HCL ER 28 MG PO CP24
28.0000 mg | ORAL_CAPSULE | Freq: Every day | ORAL | Status: DC
  Administered 2015-10-14 – 2015-10-20 (×6): 28 mg via ORAL
  Filled 2015-10-14 (×8): qty 1

## 2015-10-14 MED ORDER — LEVOTHYROXINE SODIUM 75 MCG PO TABS
75.0000 ug | ORAL_TABLET | Freq: Every day | ORAL | Status: DC
  Administered 2015-10-15 – 2015-10-20 (×5): 75 ug via ORAL
  Filled 2015-10-14 (×8): qty 1

## 2015-10-14 MED ORDER — LORATADINE 10 MG PO TABS
10.0000 mg | ORAL_TABLET | Freq: Every day | ORAL | Status: DC
  Administered 2015-10-14 – 2015-10-20 (×6): 10 mg via ORAL
  Filled 2015-10-14 (×8): qty 1

## 2015-10-14 MED ORDER — ACETAMINOPHEN 650 MG RE SUPP: 650.0000 mg | Freq: Four times a day (QID) | RECTAL | Status: DC | PRN

## 2015-10-14 MED ORDER — METRONIDAZOLE IN NACL 5-0.79 MG/ML-% IV SOLN: 500.0000 mg | Freq: Once | INTRAVENOUS | Status: DC

## 2015-10-14 MED ORDER — OXYCODONE HCL 5 MG PO TABS: 5.0000 mg | ORAL_TABLET | ORAL | Status: DC | PRN

## 2015-10-14 MED ORDER — ENOXAPARIN SODIUM 40 MG/0.4ML ~~LOC~~ SOLN
40.0000 mg | SUBCUTANEOUS | Status: DC
  Administered 2015-10-15: 40 mg via SUBCUTANEOUS
  Filled 2015-10-14 (×2): qty 0.4

## 2015-10-14 MED ORDER — OCUVITE-LUTEIN PO CAPS
1.0000 | ORAL_CAPSULE | Freq: Every day | ORAL | Status: DC
  Administered 2015-10-14 – 2015-10-20 (×6): 1 via ORAL
  Filled 2015-10-14 (×8): qty 1

## 2015-10-14 MED ORDER — SENNOSIDES-DOCUSATE SODIUM 8.6-50 MG PO TABS: 1.0000 | ORAL_TABLET | Freq: Every evening | ORAL | Status: DC | PRN

## 2015-10-14 MED ORDER — MORPHINE SULFATE (PF) 2 MG/ML IV SOLN: 0.5000 mg | INTRAVENOUS | Status: DC | PRN

## 2015-10-14 MED ORDER — FLUOXETINE HCL 20 MG PO CAPS
20.0000 mg | ORAL_CAPSULE | Freq: Every day | ORAL | Status: DC
  Administered 2015-10-14 – 2015-10-20 (×6): 20 mg via ORAL
  Filled 2015-10-14 (×8): qty 1

## 2015-10-14 MED ORDER — ARFORMOTEROL TARTRATE 15 MCG/2ML IN NEBU
15.0000 ug | INHALATION_SOLUTION | Freq: Two times a day (BID) | RESPIRATORY_TRACT | Status: DC
  Administered 2015-10-14 – 2015-10-20 (×12): 15 ug via RESPIRATORY_TRACT
  Filled 2015-10-14 (×14): qty 2

## 2015-10-14 MED ORDER — SODIUM CHLORIDE 0.9 % IV BOLUS (SEPSIS)
1000.0000 mL | Freq: Once | INTRAVENOUS | Status: AC
  Administered 2015-10-14: 1000 mL via INTRAVENOUS

## 2015-10-14 MED ORDER — TRAMADOL HCL 50 MG PO TABS
50.0000 mg | ORAL_TABLET | Freq: Three times a day (TID) | ORAL | Status: DC | PRN
  Administered 2015-10-16: 50 mg via ORAL
  Filled 2015-10-14: qty 1

## 2015-10-14 MED ORDER — CIPROFLOXACIN IN D5W 400 MG/200ML IV SOLN: 400.0000 mg | Freq: Two times a day (BID) | INTRAVENOUS | Status: DC

## 2015-10-14 MED ORDER — ONDANSETRON HCL 4 MG/2ML IJ SOLN
4.0000 mg | Freq: Four times a day (QID) | INTRAMUSCULAR | Status: DC | PRN
  Administered 2015-10-16 – 2015-10-19 (×2): 4 mg via INTRAVENOUS
  Filled 2015-10-14 (×2): qty 2

## 2015-10-14 MED ORDER — SORBITOL 70 % SOLN
30.0000 mL | Freq: Every day | Status: DC | PRN
  Filled 2015-10-14: qty 30

## 2015-10-14 MED ORDER — GUAIFENESIN 100 MG/5ML PO SYRP
100.0000 mg | ORAL_SOLUTION | Freq: Four times a day (QID) | ORAL | Status: DC | PRN
  Filled 2015-10-14: qty 5

## 2015-10-14 MED ORDER — PANTOPRAZOLE SODIUM 40 MG PO TBEC
40.0000 mg | DELAYED_RELEASE_TABLET | Freq: Every day | ORAL | Status: DC
  Administered 2015-10-14 – 2015-10-17 (×4): 40 mg via ORAL
  Filled 2015-10-14 (×7): qty 1

## 2015-10-14 MED ORDER — TRIAMCINOLONE 0.1 % CREAM:EUCERIN CREAM 1:1
1.0000 "application " | TOPICAL_CREAM | Freq: Three times a day (TID) | CUTANEOUS | Status: DC | PRN
  Filled 2015-10-14: qty 1

## 2015-10-14 MED ORDER — CLOTRIMAZOLE 1 % EX CREA
1.0000 "application " | TOPICAL_CREAM | Freq: Two times a day (BID) | CUTANEOUS | Status: DC | PRN
  Filled 2015-10-14: qty 15

## 2015-10-14 MED ORDER — LORAZEPAM 0.5 MG PO TABS
0.5000 mg | ORAL_TABLET | Freq: Two times a day (BID) | ORAL | Status: DC
  Administered 2015-10-14 – 2015-10-20 (×12): 0.5 mg via ORAL
  Filled 2015-10-14 (×13): qty 1

## 2015-10-14 MED ORDER — CHLORHEXIDINE GLUCONATE 0.12 % MT SOLN
15.0000 mL | Freq: Two times a day (BID) | OROMUCOSAL | Status: DC
  Administered 2015-10-14 – 2015-10-20 (×8): 15 mL via OROMUCOSAL
  Filled 2015-10-14 (×16): qty 15

## 2015-10-14 MED ORDER — POLYSACCHARIDE IRON COMPLEX 150 MG PO CAPS
150.0000 mg | ORAL_CAPSULE | Freq: Two times a day (BID) | ORAL | Status: DC
  Administered 2015-10-15 – 2015-10-20 (×10): 150 mg via ORAL
  Filled 2015-10-14 (×14): qty 1

## 2015-10-14 MED ORDER — DOCUSATE SODIUM 100 MG PO CAPS
100.0000 mg | ORAL_CAPSULE | Freq: Two times a day (BID) | ORAL | Status: DC
  Administered 2015-10-14 – 2015-10-17 (×7): 100 mg via ORAL
  Filled 2015-10-14 (×12): qty 1

## 2015-10-14 MED ORDER — PRESERVISION AREDS 2 PO CAPS: ORAL_CAPSULE | Freq: Two times a day (BID) | ORAL | Status: DC

## 2015-10-14 MED ORDER — METRONIDAZOLE IN NACL 5-0.79 MG/ML-% IV SOLN
500.0000 mg | Freq: Three times a day (TID) | INTRAVENOUS | Status: DC
  Filled 2015-10-14 (×2): qty 100

## 2015-10-14 MED ORDER — MIRTAZAPINE 15 MG PO TABS
15.0000 mg | ORAL_TABLET | Freq: Every day | ORAL | Status: DC
  Administered 2015-10-14 – 2015-10-19 (×6): 15 mg via ORAL
  Filled 2015-10-14 (×8): qty 1

## 2015-10-14 MED ORDER — CIPROFLOXACIN IN D5W 400 MG/200ML IV SOLN
400.0000 mg | Freq: Once | INTRAVENOUS | Status: AC
  Administered 2015-10-14: 400 mg via INTRAVENOUS
  Filled 2015-10-14 (×2): qty 200

## 2015-10-14 MED ORDER — METRONIDAZOLE IN NACL 5-0.79 MG/ML-% IV SOLN
500.0000 mg | Freq: Three times a day (TID) | INTRAVENOUS | Status: DC
  Administered 2015-10-14 – 2015-10-17 (×9): 500 mg via INTRAVENOUS
  Filled 2015-10-14 (×9): qty 100

## 2015-10-14 MED ORDER — ATORVASTATIN CALCIUM 10 MG PO TABS
10.0000 mg | ORAL_TABLET | Freq: Every day | ORAL | Status: DC
  Administered 2015-10-14 – 2015-10-19 (×6): 10 mg via ORAL
  Filled 2015-10-14 (×7): qty 1

## 2015-10-14 MED ORDER — ONDANSETRON HCL 4 MG PO TABS
4.0000 mg | ORAL_TABLET | Freq: Four times a day (QID) | ORAL | Status: DC | PRN
  Administered 2015-10-18: 4 mg via ORAL
  Filled 2015-10-14: qty 1

## 2015-10-14 MED ORDER — FLUTICASONE FUROATE-VILANTEROL 100-25 MCG/INH IN AEPB: 1.0000 | INHALATION_SPRAY | Freq: Every day | RESPIRATORY_TRACT | Status: DC

## 2015-10-14 MED ORDER — HYDROXYZINE PAMOATE 25 MG PO CAPS
25.0000 mg | ORAL_CAPSULE | Freq: Three times a day (TID) | ORAL | Status: DC | PRN
  Filled 2015-10-14: qty 1

## 2015-10-14 MED ORDER — CIPROFLOXACIN IN D5W 400 MG/200ML IV SOLN
400.0000 mg | Freq: Two times a day (BID) | INTRAVENOUS | Status: DC
  Administered 2015-10-15 – 2015-10-17 (×5): 400 mg via INTRAVENOUS
  Filled 2015-10-14 (×6): qty 200

## 2015-10-14 MED ORDER — CETYLPYRIDINIUM CHLORIDE 0.05 % MT LIQD
7.0000 mL | Freq: Two times a day (BID) | OROMUCOSAL | Status: DC
  Administered 2015-10-15 – 2015-10-20 (×5): 7 mL via OROMUCOSAL

## 2015-10-14 MED ORDER — SODIUM CHLORIDE 0.9 % IV SOLN
INTRAVENOUS | Status: DC
  Administered 2015-10-14 – 2015-10-17 (×4): via INTRAVENOUS

## 2015-10-14 NOTE — Consult Note (Signed)
Reason for Consult:  abdominal pain Referring Physician: Lelah Rennaker is an 81 y.o. female    HPI: patient is an 81 year old  Female who resides in assisted living and presents to the emergency department due to abdominal pain.The patient has multiple medical problems as below including history of stroke and recent worsening dementia. She is non-ambulatory. She is a poor historian but is able to give some history. She is accompanied by her daughter who is able to help provide history.She apparently developed the onset of abdominal pain today. She has a known ventral hernia which her daughter says has been increasing in size gradually and the patient indicates that this is the location of her pain. She and her daughter deny any significant recent abdominal or GI complaints. She has had a number of abdominal operations as listed below and has a remote history of a small bowel obstruction years ago that resolved nonoperatively. She describes constant pain not particularly severe however in her upper abdomen at the site of the hernia. She states she has been nauseated without vomiting. Had a normal bowel movement today. No melena or hematochezia. No fever or chills.No urinary symptoms.  Past Medical History  Diagnosis Date  . Diverticulosis   . Esophageal stricture   . Hiatal hernia   . Iron deficiency anemia   . Depression   . Anxiety   . Arthritis   . Asthma   . Gastric ulcer 1994  . Partial small bowel obstruction ([place]) 1995  . IBS (irritable bowel syndrome)   . GERD (gastroesophageal reflux disease)   . Osteoporosis   . Insomnia   . Macular degeneration   . Seborrheic keratosis   . Stroke ([place])   . Dementia     Past Surgical History  Procedure Laterality Date  . Cholecystectomy    . Appendectomy    . Tonsillectomy    . Spine surgery    . Bunionectomy    . Sinus surgery with instatrak    . Abdominal hysterectomy      Family History  Problem Relation Age of Onset  .  Ovarian cancer Mother   . Heart disease    . Macular degeneration    . Colon cancer Neg Hx     Social History:  reports that she has never smoked. She has never used smokeless tobacco. She reports that she drinks alcohol. She reports that she does not use illicit drugs.  Allergies:  Allergies  Allergen Reactions  . Iohexol      Desc: Pt states that years ago she had a procedure that involved IV contrast and had throat swelling and sob. She was told never to have IV contrast again.  Pt is a very poor historian and cannot remember the date or the procedure.   . Milk-Related Compounds Nausea And Vomiting  . Penicillins Swelling    Tongue swelling    Current Facility-Administered Medications  Medication Dose Route Frequency Provider Last Rate Last Dose  . 0.9 %  sodium chloride infusion   Intravenous Continuous Irine Seal V, MD      . ciprofloxacin (CIPRO) IVPB 400 mg  400 mg Intravenous Once Wandra Arthurs, MD      . metroNIDAZOLE (FLAGYL) IVPB 500 mg  500 mg Intravenous Once Wandra Arthurs, MD       Current Outpatient Prescriptions  Medication Sig Dispense Refill  . acetaminophen (TYLENOL) 325 MG tablet Take 650 mg by mouth every 6 (six) hours as needed for  moderate pain or fever.    Marland Kitchen atorvastatin (LIPITOR) 10 MG tablet Take 10 mg by mouth daily at 6 PM.     . clotrimazole (LOTRIMIN) 1 % cream Apply 1 application topically 2 (two) times daily as needed (rash).    Marland Kitchen FLUoxetine (PROZAC) 20 MG capsule Take 20 mg by mouth daily.    . Fluticasone Furoate-Vilanterol 100-25 MCG/INH AEPB Inhale 1 puff into the lungs daily.    Marland Kitchen guaifenesin (ROBITUSSIN) 100 MG/5ML syrup Take 100 mg by mouth 4 (four) times daily as needed for cough.    . hydrOXYzine (VISTARIL) 25 MG capsule Take 25 mg by mouth every 8 (eight) hours as needed. Itching    . iron polysaccharides (NIFEREX) 150 MG capsule Take 150 mg by mouth 2 (two) times daily.      Marland Kitchen levothyroxine (SYNTHROID, LEVOTHROID) 75 MCG tablet Take 75  mcg by mouth daily before breakfast.    . loratadine (CLARITIN) 10 MG tablet Take 10 mg by mouth daily.    Marland Kitchen LORazepam (ATIVAN) 0.5 MG tablet Take 0.5 mg by mouth 2 (two) times daily. May take another 1 tablet as needed for anxiety    . Memantine HCl ER (NAMENDA XR) 28 MG CP24 Take 28 mg by mouth daily.    . mirtazapine (REMERON) 15 MG tablet Take 15 mg by mouth at bedtime.      . Multiple Vitamins-Minerals (PRESERVISION AREDS 2 PO) Take 1 capsule by mouth 2 (two) times daily.     Marland Kitchen omeprazole (PRILOSEC) 20 MG capsule Take 20 mg by mouth daily.    Marland Kitchen PRESCRIPTION MEDICATION Apply 1 application topically as needed (for itching to skin). TRIAMCINOLONE 0.1% AND HYDROCERIN 2:1 CREAM    . traMADol (ULTRAM) 50 MG tablet Take 1 tablet (50 mg total) by mouth every 6 (six) hours as needed (for pain). (Patient taking differently: Take 50 mg by mouth every 8 (eight) hours as needed (for pain). ) 20 tablet 0  . Triamcinolone Acetonide (TRIAMCINOLONE 0.1 % CREAM : EUCERIN) CREA Apply 1 application topically 3 (three) times daily as needed for itching.    . Vitamin D, Ergocalciferol, (DRISDOL) 50000 UNITS CAPS capsule Take 50,000 Units by mouth every 7 (seven) days.       Results for orders placed or performed during the hospital encounter of 10/14/15 (from the past 48 hour(s))  CBC with Differential     Status: Abnormal   Collection Time: 10/14/15 12:22 PM  Result Value Ref Range   WBC 13.4 (H) 4.0 - 10.5 K/uL   RBC 4.21 3.87 - 5.11 MIL/uL   Hemoglobin 13.5 12.0 - 15.0 g/dL   HCT 42.5 36.0 - 46.0 %   MCV 101.0 (H) 78.0 - 100.0 fL   MCH 32.1 26.0 - 34.0 pg   MCHC 31.8 30.0 - 36.0 g/dL   RDW 13.1 11.5 - 15.5 %   Platelets 225 150 - 400 K/uL   Neutrophils Relative % 89 %   Neutro Abs 11.9 (H) 1.7 - 7.7 K/uL   Lymphocytes Relative 7 %   Lymphs Abs 0.9 0.7 - 4.0 K/uL   Monocytes Relative 4 %   Monocytes Absolute 0.5 0.1 - 1.0 K/uL   Eosinophils Relative 0 %   Eosinophils Absolute 0.0 0.0 - 0.7 K/uL    Basophils Relative 0 %   Basophils Absolute 0.0 0.0 - 0.1 K/uL  Comprehensive metabolic panel     Status: Abnormal   Collection Time: 10/14/15 12:22 PM  Result Value Ref Range  Sodium 142 135 - 145 mmol/L   Potassium 4.6 3.5 - 5.1 mmol/L   Chloride 106 101 - 111 mmol/L   CO2 27 22 - 32 mmol/L   Glucose, Bld 110 (H) 65 - 99 mg/dL   BUN 26 (H) 6 - 20 mg/dL   Creatinine, Ser 1.16 (H) 0.44 - 1.00 mg/dL   Calcium 9.0 8.9 - 10.3 mg/dL   Total Protein 6.8 6.5 - 8.1 g/dL   Albumin 4.0 3.5 - 5.0 g/dL   AST 24 15 - 41 U/L   ALT 16 14 - 54 U/L   Alkaline Phosphatase 84 38 - 126 U/L   Total Bilirubin 0.8 0.3 - 1.2 mg/dL   GFR calc non Af Amer 42 (L) >60 mL/min   GFR calc Af Amer 48 (L) >60 mL/min    Comment: (NOTE) The eGFR has been calculated using the CKD EPI equation. This calculation has not been validated in all clinical situations. eGFR's persistently <60 mL/min signify possible Chronic Kidney Disease.    Anion gap 9 5 - 15  Lipase, blood     Status: Abnormal   Collection Time: 10/14/15 12:22 PM  Result Value Ref Range   Lipase 54 (H) 11 - 51 U/L  Urinalysis, Routine w reflex microscopic (not at [facility])     Status: None   Collection Time: 10/14/15  2:03 PM  Result Value Ref Range   Color, Urine YELLOW YELLOW   APPearance CLEAR CLEAR   Specific Gravity, Urine 1.014 1.005 - 1.030   pH 5.5 5.0 - 8.0   Glucose, UA NEGATIVE NEGATIVE mg/dL   Hgb urine dipstick NEGATIVE NEGATIVE   Bilirubin Urine NEGATIVE NEGATIVE   Ketones, ur NEGATIVE NEGATIVE mg/dL   Protein, ur NEGATIVE NEGATIVE mg/dL   Nitrite NEGATIVE NEGATIVE   Leukocytes, UA NEGATIVE NEGATIVE    Comment: MICROSCOPIC NOT DONE ON URINES WITH NEGATIVE PROTEIN, BLOOD, LEUKOCYTES, NITRITE, OR GLUCOSE <1000 mg/dL.    Ct Abdomen Pelvis Wo Contrast  10/14/2015  CLINICAL DATA:  Abdominal pain with soft tissue abdominal mass. History of dementia and anemia. EXAM: CT ABDOMEN AND PELVIS WITHOUT CONTRAST TECHNIQUE: Multidetector CT  imaging of the abdomen and pelvis was performed following the standard protocol without IV contrast. COMPARISON:  Abdominal CT 07/29/2003.  Pelvic CT 10/13/2013. FINDINGS: Lower chest: The lung bases are clear. There is no significant pleural or pericardial effusion. There is a large hiatal hernia which has enlarged compared with the prior study. 3 cm low-density lesion along the right hemidiaphragm is unchanged, likely an incidental diaphragmatic or bronchogenic cyst. Hepatobiliary: There is a 2.8 cm cyst in the left hepatic lobe on image number 18 which has enlarged. No suspicious hepatic findings. Mild extrahepatic biliary prominence status post cholecystectomy, within physiologic limits. Pancreas: Atrophied without focal abnormality or surrounding inflammation. Spleen: Normal in size without focal abnormality. Adrenals/Urinary Tract: Both adrenal glands appear normal. Both kidneys appear normal without evidence of hydronephrosis, focal mass lesion or perinephric soft tissue stranding. No evidence of urinary tract calculus. The bladder is mildly distended without apparent focal abnormality. Stomach/Bowel: As above, large hiatal hernia. There is a long segment of abnormal circumferential small bowel wall thickening in the mid and right abdomen. The distal small bowel appears normal. There is no high-grade bowel obstruction. There is edema throughout the mesenteric fat. Mild diverticular changes are present within the distal colon. No definite extraluminal air or fluid collections are identified, although there is an atypical collection of gas in the mid abdomen (image 42)  which appears to be within the small bowel lumen based on the reformatted images. Vascular/Lymphatic: There are no enlarged abdominal or pelvic lymph nodes. Mild atherosclerosis of the aorta, its branches and the iliac arteries. Vascular assessment limited without contrast. Reproductive: Unremarkable. Other: There is a midline supraumbilical  hernia containing only fat, measuring up to 6.1 cm in diameter. No herniated bowel or soft tissue mass identified. Musculoskeletal: No acute or significant osseous findings. There are posttraumatic deformities of the right pubic rami. There are degenerative changes throughout the lumbar spine associate with a convex right scoliosis and grade 1 anterolisthesis at L3-4. Superior endplate compression deformity at T10 does not appear acute. IMPRESSION: 1. Abnormal long segment mid small bowel wall thickening with surrounding mesenteric edema suspicious for ischemia or bowel hemorrhage. No high-grade obstruction or signs of perforation. 2. Patient's palpable concern may correspond with a supraumbilical hernia containing only fat. There is no herniated bowel. 3. Large hiatal hernia. 4. These results were called by telephone at the time of interpretation on 10/14/2015 at 2:03 pm to Dr. Shirlyn Goltz , who verbally acknowledged these results. Electronically Signed   By: Richardean Sale M.D.   On: 10/14/2015 14:05    ROS Blood pressure 156/77, pulse 102, temperature 98.7 F (37.1 C), temperature source Temporal, resp. rate 18, SpO2 98 %. Physical Exam General: Alert, thin elderly female, in no distress Skin: Warm and dry without rash or infection. HEENT: No palpable masses or thyromegaly. Sclera nonicteric. Pupils equal round and reactive. Oropharynx clear. Lymph nodes: No cervical, supraclavicular, or inguinal nodes palpable. Breasts: No masses palpable Lungs: Breath sounds clear and equal without increased work of breathing Cardiovascular: Regular rate and rhythm without murmur. No JVD or edema.  Abdomen: Nondistended. Coker incision right upper quadrant.At the medialend of the incision is an approximately 5 cm incisional hernia that is reduciblebut somewhat tender. She indicates this is the location of her pain. The remainder of her abdomen is soft and nontender. Extremities: No edema or joint swelling or  deformity. No chronic venous stasis changes. Neurologic: Alert and conversant. Oriented to person and place but not date. Vague historian.  Assessment/Plan: 81 year old female with multiple medical problems who presents withacute abdominal pain. CT scan shows a segment of thick-walled small bowel with edema. Consistent with enteritis with possible causes including infectious, ischemia or less likely new onset of inflammatory bowel disease. She does not appear to have a surgical abdomen. She indicates her pain is at the site of her incisional hernia which is the only area that is tender. It is reducible and not a surgical emergency but could also be the cause of her pain. Probably not a good surgical candidate for repair. Agree with admission and IV antibiotics and observation. We will follow with you. All this discussed with the patient and her daughter and questions answered.  Luke Rigsbee T 10/14/2015, 3:02 PM

## 2015-10-14 NOTE — Progress Notes (Signed)
Pt refused all night time medications this evening.  Each medication explained to patient and what it does. Encouraged pt to take medications. Pt continued to refuse all medications. Asked if pt felt unwell, in pain, or nauseous. Pt stated 'no, I just said no.  Asked pt for her name and her birthday, where she was and why she was there, pt refused to answer any orientation questions. Will continue to monitor.   Roselind Rily

## 2015-10-14 NOTE — Progress Notes (Signed)
Pt more cognitive and appropriate. Pt willingly took bedtime medications after they were explained. Pt has no complaints, answered orientation questions, and thanked me when done with care.  Will continue to monitor.   Roselind Rily

## 2015-10-14 NOTE — ED Notes (Signed)
Pt from [facility] via EMS c/o abd pain with soft abd mass. Pt denies N/V. Pt has hx of dementia but is at her baseline. Facility and pt denies injury. Pt in NAD

## 2015-10-14 NOTE — ED Notes (Signed)
Talked to pt family about pt care and the tests that were performed. Pt daughter wishes to speak to provider. Dr Darl Householder notified

## 2015-10-14 NOTE — ED Notes (Addendum)
Pt reports mass to central lower abd (just above umbilicus) for "a while." PT sts that she started to have pain and nausea this am. Pt denies pain or nausea at this time. Pt answers questions correctly and in NAD. Pt has hyperactive bowel sounds in a 4 quads and reports last BM today.

## 2015-10-14 NOTE — ED Provider Notes (Signed)
CSN: [id number]     Arrival date & time 10/14/15  1139 History   First MD Initiated Contact with Patient 10/14/15 1151     Chief Complaint  Patient presents with  . Abdominal Pain     (Consider location/radiation/quality/duration/timing/severity/associated sxs/prior Treatment) The history is provided by the patient.  Melanie Levy is a 81 y.o. female hx of diverticulosis, esophageal stricture, anemia, multiple abdominal surgeries here with abdominal pain. Patient states that she noticed a bump in her abdomen for several weeks. Started feeling nauseated this morning and noticed that the bump got worse. Patient has been passing gas. Denies any vomiting. Denies any fevers or chills. Resides at [facility].   Past Medical History  Diagnosis Date  . Diverticulosis   . Esophageal stricture   . Hiatal hernia   . Iron deficiency anemia   . Depression   . Anxiety   . Arthritis   . Asthma   . Gastric ulcer 1994  . Partial small bowel obstruction ([place]) 1995  . IBS (irritable bowel syndrome)   . GERD (gastroesophageal reflux disease)   . Osteoporosis   . Insomnia   . Macular degeneration   . Seborrheic keratosis   . Stroke ([place])   . Dementia    Past Surgical History  Procedure Laterality Date  . Cholecystectomy    . Appendectomy    . Tonsillectomy    . Spine surgery    . Bunionectomy    . Sinus surgery with instatrak    . Abdominal hysterectomy     Family History  Problem Relation Age of Onset  . Ovarian cancer Mother   . Heart disease    . Macular degeneration    . Colon cancer Neg Hx    Social History  Substance Use Topics  . Smoking status: Never Smoker   . Smokeless tobacco: Never Used  . Alcohol Use: Yes     Comment: not often    OB History    No data available     Review of Systems  Gastrointestinal: Positive for nausea and abdominal pain.  All other systems reviewed and are negative.     Allergies  Iohexol; Milk-related compounds;  and Penicillins  Home Medications   Prior to Admission medications   Medication Sig Start Date End Date Taking? Authorizing Provider  acetaminophen (TYLENOL) 325 MG tablet Take 650 mg by mouth every 6 (six) hours as needed for moderate pain or fever.   Yes Historical Provider, MD  atorvastatin (LIPITOR) 10 MG tablet Take 10 mg by mouth daily at 6 PM.    Yes Historical Provider, MD  clotrimazole (LOTRIMIN) 1 % cream Apply 1 application topically 2 (two) times daily as needed (rash).   Yes Historical Provider, MD  FLUoxetine (PROZAC) 20 MG capsule Take 20 mg by mouth daily.   Yes Historical Provider, MD  Fluticasone Furoate-Vilanterol 100-25 MCG/INH AEPB Inhale 1 puff into the lungs daily.   Yes Historical Provider, MD  guaifenesin (ROBITUSSIN) 100 MG/5ML syrup Take 100 mg by mouth 4 (four) times daily as needed for cough.   Yes Historical Provider, MD  hydrOXYzine (VISTARIL) 25 MG capsule Take 25 mg by mouth every 8 (eight) hours as needed. Itching 07/31/15  Yes Historical Provider, MD  iron polysaccharides (NIFEREX) 150 MG capsule Take 150 mg by mouth 2 (two) times daily.     Yes Historical Provider, MD  levothyroxine (SYNTHROID, LEVOTHROID) 75 MCG tablet Take 75 mcg by mouth daily before breakfast.   Yes  Historical Provider, MD  loratadine (CLARITIN) 10 MG tablet Take 10 mg by mouth daily.   Yes Historical Provider, MD  LORazepam (ATIVAN) 0.5 MG tablet Take 0.5 mg by mouth 2 (two) times daily. May take another 1 tablet as needed for anxiety   Yes Historical Provider, MD  Memantine HCl ER (NAMENDA XR) 28 MG CP24 Take 28 mg by mouth daily.   Yes Historical Provider, MD  mirtazapine (REMERON) 15 MG tablet Take 15 mg by mouth at bedtime.     Yes Historical Provider, MD  Multiple Vitamins-Minerals (PRESERVISION AREDS 2 PO) Take 1 capsule by mouth 2 (two) times daily.    Yes Historical Provider, MD  omeprazole (PRILOSEC) 20 MG capsule Take 20 mg by mouth daily.   Yes Historical Provider, MD   PRESCRIPTION MEDICATION Apply 1 application topically as needed (for itching to skin). TRIAMCINOLONE 0.1% AND HYDROCERIN 2:1 CREAM   Yes Historical Provider, MD  traMADol (ULTRAM) 50 MG tablet Take 1 tablet (50 mg total) by mouth every 6 (six) hours as needed (for pain). Patient taking differently: Take 50 mg by mouth every 8 (eight) hours as needed (for pain).  10/16/14  Yes John Molpus, MD  Triamcinolone Acetonide (TRIAMCINOLONE 0.1 % CREAM : EUCERIN) CREA Apply 1 application topically 3 (three) times daily as needed for itching.   Yes Historical Provider, MD  Vitamin D, Ergocalciferol, (DRISDOL) 50000 UNITS CAPS capsule Take 50,000 Units by mouth every 7 (seven) days.   Yes Historical Provider, MD   BP 156/77 mmHg  Pulse 102  Temp(Src) 98.7 F (37.1 C) (Temporal)  Resp 18  SpO2 98% Physical Exam  Constitutional: She is oriented to person, place, and time.  Chronically ill   HENT:  Head: Normocephalic.  Mouth/Throat: Oropharynx is clear and moist.  Eyes: Conjunctivae are normal. Pupils are equal, round, and reactive to light.  Neck: Normal range of motion. Neck supple.  Cardiovascular: Normal rate, regular rhythm and normal heart sounds.   Pulmonary/Chest: Effort normal and breath sounds normal. No respiratory distress. She has no wheezes. She has no rales.  Abdominal:  Multiple scars. Small umbilical hernia that is reducible and minimally tender.   Musculoskeletal: Normal range of motion.  Neurological: She is alert and oriented to person, place, and time.  Skin: Skin is warm and dry.  Psychiatric: She has a normal mood and affect. Her behavior is normal. Judgment and thought content normal.  Nursing note and vitals reviewed.   ED Course  Procedures (including critical care time) Labs Review Labs Reviewed  CBC WITH DIFFERENTIAL/PLATELET - Abnormal; Notable for the following:    WBC 13.4 (*)    MCV 101.0 (*)    Neutro Abs 11.9 (*)    All other components within normal limits   COMPREHENSIVE METABOLIC PANEL - Abnormal; Notable for the following:    Glucose, Bld 110 (*)    BUN 26 (*)    Creatinine, Ser 1.16 (*)    GFR calc non Af Amer 42 (*)    GFR calc Af Amer 48 (*)    All other components within normal limits  LIPASE, BLOOD - Abnormal; Notable for the following:    Lipase 54 (*)    All other components within normal limits  URINALYSIS, ROUTINE W REFLEX MICROSCOPIC (NOT AT [facility])    Imaging Review Ct Abdomen Pelvis Wo Contrast  10/14/2015  CLINICAL DATA:  Abdominal pain with soft tissue abdominal mass. History of dementia and anemia. EXAM: CT ABDOMEN AND PELVIS WITHOUT CONTRAST  TECHNIQUE: Multidetector CT imaging of the abdomen and pelvis was performed following the standard protocol without IV contrast. COMPARISON:  Abdominal CT 07/29/2003.  Pelvic CT 10/13/2013. FINDINGS: Lower chest: The lung bases are clear. There is no significant pleural or pericardial effusion. There is a large hiatal hernia which has enlarged compared with the prior study. 3 cm low-density lesion along the right hemidiaphragm is unchanged, likely an incidental diaphragmatic or bronchogenic cyst. Hepatobiliary: There is a 2.8 cm cyst in the left hepatic lobe on image number 18 which has enlarged. No suspicious hepatic findings. Mild extrahepatic biliary prominence status post cholecystectomy, within physiologic limits. Pancreas: Atrophied without focal abnormality or surrounding inflammation. Spleen: Normal in size without focal abnormality. Adrenals/Urinary Tract: Both adrenal glands appear normal. Both kidneys appear normal without evidence of hydronephrosis, focal mass lesion or perinephric soft tissue stranding. No evidence of urinary tract calculus. The bladder is mildly distended without apparent focal abnormality. Stomach/Bowel: As above, large hiatal hernia. There is a long segment of abnormal circumferential small bowel wall thickening in the mid and right abdomen. The distal small bowel  appears normal. There is no high-grade bowel obstruction. There is edema throughout the mesenteric fat. Mild diverticular changes are present within the distal colon. No definite extraluminal air or fluid collections are identified, although there is an atypical collection of gas in the mid abdomen (image 42) which appears to be within the small bowel lumen based on the reformatted images. Vascular/Lymphatic: There are no enlarged abdominal or pelvic lymph nodes. Mild atherosclerosis of the aorta, its branches and the iliac arteries. Vascular assessment limited without contrast. Reproductive: Unremarkable. Other: There is a midline supraumbilical hernia containing only fat, measuring up to 6.1 cm in diameter. No herniated bowel or soft tissue mass identified. Musculoskeletal: No acute or significant osseous findings. There are posttraumatic deformities of the right pubic rami. There are degenerative changes throughout the lumbar spine associate with a convex right scoliosis and grade 1 anterolisthesis at L3-4. Superior endplate compression deformity at T10 does not appear acute. IMPRESSION: 1. Abnormal long segment mid small bowel wall thickening with surrounding mesenteric edema suspicious for ischemia or bowel hemorrhage. No high-grade obstruction or signs of perforation. 2. Patient's palpable concern may correspond with a supraumbilical hernia containing only fat. There is no herniated bowel. 3. Large hiatal hernia. 4. These results were called by telephone at the time of interpretation on 10/14/2015 at 2:03 pm to Dr. Shirlyn Goltz , who verbally acknowledged these results. Electronically Signed   By: Richardean Sale M.D.   On: 10/14/2015 14:05   I have personally reviewed and evaluated these images and lab results as part of my medical decision-making.   EKG Interpretation None      MDM   Final diagnoses:  Small bowel edema   Melanie Levy is a 81 y.o. female here with ab pain, likely from abdominal  hernia. Hernia is reducible, it is not incarcerated. Will get labs, CT ab/pel to r/o SBO.   2:52 PM WBC 13. CT showed small bowel edema, possible ischemia vs hemorrhage. I also consider infectious etiology. Consulted Dr. Excell Seltzer. Will admit to medical service.     Wandra Arthurs, MD 10/14/15 [phone]

## 2015-10-14 NOTE — ED Notes (Signed)
Bed: [id number] Expected date: 10/14/15 Expected time: 11:34 AM Means of arrival: Ambulance Comments: Abd, ? mass

## 2015-10-14 NOTE — H&P (Addendum)
[facility] History and Physical  ADAIR BADMAN [id number] DOB: 10-30-1928 DOA: 10/14/2015  Referring physician: Dr. Darl Householder PCP: Donnajean Lopes, MD   Chief Complaint: Stomach pain  HPI: Melanie Levy is a 81 y.o. female  With history of diverticulosis, esophageal stricture, iron deficiency anemia, history of cholecystectomy, appendectomy, abdominal hysterectomy, dementia, depression, anxiety, diverticulosis, prior history of partial small bowel obstruction which resolved nonoperatively, gastroesophageal reflux disease presented to the ED with sudden onset of diffuse abdominal pain. Patient describes the pain as a constant pain that woke her up on the morning of admission. Patient denies any fevers, no chills, no diarrhea, no constipation, no dysuria, no weakness, no melena, no hematemesis, no hematochezia. Patient denies any chest pain, no shortness of breath. Patient was seen in the emergency room, comprehensive metabolic profile obtained and a BUN of 26 creatinine 1.16 glucose of 110 otherwise was within normal limits. Lipase levels of T4. CBC had a white count of 13.4 hemoglobin of 13.5, white count of 13.4, platelets of 225. Urinalysis was nitrite negative, leukocytes negative. Patient was given a dose of IV ciprofloxacin and Flagyl. [facility] were called to admit the patient for further evaluation and management.   Review of Systems:  per history of present illness otherwise negative. Constitutional:  No weight loss, night sweats, Fevers, chills, fatigue.  HEENT:  No headaches, Difficulty swallowing,Tooth/dental problems,Sore throat,  No sneezing, itching, ear ache, nasal congestion, post nasal drip,  Cardio-vascular:  No chest pain, Orthopnea, PND, swelling in lower extremities, anasarca, dizziness, palpitations  GI:  No heartburn, indigestion, abdominal pain, nausea, vomiting, diarrhea, change in bowel habits, loss of appetite  Resp:  No shortness of breath  with exertion or at rest. No excess mucus, no productive cough, No non-productive cough, No coughing up of blood.No change in color of mucus.No wheezing.No chest wall deformity  Skin:  no rash or lesions.  GU:  no dysuria, change in color of urine, no urgency or frequency. No flank pain.  Musculoskeletal:  No joint pain or swelling. No decreased range of motion. No back pain.  Psych:  No change in mood or affect. No depression or anxiety. No memory loss.   Past Medical History  Diagnosis Date  . Diverticulosis   . Esophageal stricture   . Hiatal hernia   . Iron deficiency anemia   . Depression   . Anxiety   . Arthritis   . Asthma   . Gastric ulcer 1994  . Partial small bowel obstruction ([place]) 1995  . IBS (irritable bowel syndrome)   . GERD (gastroesophageal reflux disease)   . Osteoporosis   . Insomnia   . Macular degeneration   . Seborrheic keratosis   . Stroke ([employer] [facility] [place])   . Dementia   . CKD (chronic kidney disease) stage 3, GFR 30-59 ml/min 10/14/2015  . Hypothyroidism   . Hyperlipidemia    Past Surgical History  Procedure Laterality Date  . Cholecystectomy    . Appendectomy    . Tonsillectomy    . Spine surgery    . Bunionectomy    . Sinus surgery with instatrak    . Abdominal hysterectomy     Social History:  reports that she has never smoked. She has never used smokeless tobacco. She reports that she drinks alcohol. She reports that she does not use illicit drugs.  Allergies  Allergen Reactions  . Iohexol      Desc: Pt states that years ago she had a procedure that involved IV contrast and  had throat swelling and sob. She was told never to have IV contrast again.  Pt is a very poor historian and cannot remember the date or the procedure.   . Milk-Related Compounds Nausea And Vomiting  . Penicillins Swelling    Tongue swelling    Family History  Problem Relation Age of Onset  . Ovarian cancer Mother   . Heart disease    . Macular degeneration    . Colon  cancer Neg Hx    father deceased age 78 from a motor vehicle accident. Mother deceased at age 66 from cervical cancer.   Prior to Admission medications   Medication Sig Start Date End Date Taking? Authorizing Provider  acetaminophen (TYLENOL) 325 MG tablet Take 650 mg by mouth every 6 (six) hours as needed for moderate pain or fever.   Yes Historical Provider, MD  atorvastatin (LIPITOR) 10 MG tablet Take 10 mg by mouth daily at 6 PM.    Yes Historical Provider, MD  clotrimazole (LOTRIMIN) 1 % cream Apply 1 application topically 2 (two) times daily as needed (rash).   Yes Historical Provider, MD  FLUoxetine (PROZAC) 20 MG capsule Take 20 mg by mouth daily.   Yes Historical Provider, MD  Fluticasone Furoate-Vilanterol 100-25 MCG/INH AEPB Inhale 1 puff into the lungs daily.   Yes Historical Provider, MD  guaifenesin (ROBITUSSIN) 100 MG/5ML syrup Take 100 mg by mouth 4 (four) times daily as needed for cough.   Yes Historical Provider, MD  hydrOXYzine (VISTARIL) 25 MG capsule Take 25 mg by mouth every 8 (eight) hours as needed. Itching 07/31/15  Yes Historical Provider, MD  iron polysaccharides (NIFEREX) 150 MG capsule Take 150 mg by mouth 2 (two) times daily.     Yes Historical Provider, MD  levothyroxine (SYNTHROID, LEVOTHROID) 75 MCG tablet Take 75 mcg by mouth daily before breakfast.   Yes Historical Provider, MD  loratadine (CLARITIN) 10 MG tablet Take 10 mg by mouth daily.   Yes Historical Provider, MD  LORazepam (ATIVAN) 0.5 MG tablet Take 0.5 mg by mouth 2 (two) times daily. May take another 1 tablet as needed for anxiety   Yes Historical Provider, MD  Memantine HCl ER (NAMENDA XR) 28 MG CP24 Take 28 mg by mouth daily.   Yes Historical Provider, MD  mirtazapine (REMERON) 15 MG tablet Take 15 mg by mouth at bedtime.     Yes Historical Provider, MD  Multiple Vitamins-Minerals (PRESERVISION AREDS 2 PO) Take 1 capsule by mouth 2 (two) times daily.    Yes Historical Provider, MD  omeprazole  (PRILOSEC) 20 MG capsule Take 20 mg by mouth daily.   Yes Historical Provider, MD  PRESCRIPTION MEDICATION Apply 1 application topically as needed (for itching to skin). TRIAMCINOLONE 0.1% AND HYDROCERIN 2:1 CREAM   Yes Historical Provider, MD  traMADol (ULTRAM) 50 MG tablet Take 1 tablet (50 mg total) by mouth every 6 (six) hours as needed (for pain). Patient taking differently: Take 50 mg by mouth every 8 (eight) hours as needed (for pain).  10/16/14  Yes John Molpus, MD  Triamcinolone Acetonide (TRIAMCINOLONE 0.1 % CREAM : EUCERIN) CREA Apply 1 application topically 3 (three) times daily as needed for itching.   Yes Historical Provider, MD  Vitamin D, Ergocalciferol, (DRISDOL) 50000 UNITS CAPS capsule Take 50,000 Units by mouth every 7 (seven) days.   Yes Historical Provider, MD   Physical Exam: Filed Vitals:   10/14/15 1143 10/14/15 1304  BP:  156/77  Pulse:  102  Temp:  98.7 F (37.1 C)  TempSrc:  Temporal  Resp:  18  SpO2: 98% 98%    Wt Readings from Last 3 Encounters:  08/24/13 54.2 kg (119 lb 7.8 oz)  10/24/11 56.7 kg (125 lb)  10/17/11 56.7 kg (125 lb)    General:   Well-developed well-nourished laying on gurney in no acute cardiopulmonary distress. Speaking in full sentences. Eyes: PERRLA, EOMI, normal lids, irises & conjunctiva ENT: grossly normal hearing, lips & tongue dry mucous membranes. . Neck: no LAD, masses or thyromegaly Cardiovascular: RRR, no m/r/g. No LE edema. Respiratory:  soft, hyperactive bowel sounds, no rebound or guarding.CTA bilaterally, no w/r/r. Normal respiratory effort. Abdomen: soft, ntnd, reducible palpable supraumbilical hernia, some tenderness to palpation in the mid abdominal region. No rebound. No guarding.  Skin: no rash or induration seen on limited exam Musculoskeletal: grossly normal tone BUE/BLE Psychiatric: grossly normal mood and affect, speech fluent and appropriate Neurologic:  Alert and oriented 3. Cranial nerves II through XII  grossly intact. No focal deficits.          Labs on Admission:  Basic Metabolic Panel:  Recent Labs Lab 10/14/15 1222  NA 142  K 4.6  CL 106  CO2 27  GLUCOSE 110*  BUN 26*  CREATININE 1.16*  CALCIUM 9.0   Liver Function Tests:  Recent Labs Lab 10/14/15 1222  AST 24  ALT 16  ALKPHOS 84  BILITOT 0.8  PROT 6.8  ALBUMIN 4.0    Recent Labs Lab 10/14/15 1222  LIPASE 54*   No results for input(s): AMMONIA in the last 168 hours. CBC:  Recent Labs Lab 10/14/15 1222  WBC 13.4*  NEUTROABS 11.9*  HGB 13.5  HCT 42.5  MCV 101.0*  PLT 225   Cardiac Enzymes: No results for input(s): CKTOTAL, CKMB, CKMBINDEX, TROPONINI in the last 168 hours.  BNP (last 3 results) No results for input(s): BNP in the last 8760 hours.  ProBNP (last 3 results) No results for input(s): PROBNP in the last 8760 hours.  CBG: No results for input(s): GLUCAP in the last 168 hours.  Radiological Exams on Admission: Ct Abdomen Pelvis Wo Contrast  10/14/2015  CLINICAL DATA:  Abdominal pain with soft tissue abdominal mass. History of dementia and anemia. EXAM: CT ABDOMEN AND PELVIS WITHOUT CONTRAST TECHNIQUE: Multidetector CT imaging of the abdomen and pelvis was performed following the standard protocol without IV contrast. COMPARISON:  Abdominal CT 07/29/2003.  Pelvic CT 10/13/2013. FINDINGS: Lower chest: The lung bases are clear. There is no significant pleural or pericardial effusion. There is a large hiatal hernia which has enlarged compared with the prior study. 3 cm low-density lesion along the right hemidiaphragm is unchanged, likely an incidental diaphragmatic or bronchogenic cyst. Hepatobiliary: There is a 2.8 cm cyst in the left hepatic lobe on image number 18 which has enlarged. No suspicious hepatic findings. Mild extrahepatic biliary prominence status post cholecystectomy, within physiologic limits. Pancreas: Atrophied without focal abnormality or surrounding inflammation. Spleen:  Normal in size without focal abnormality. Adrenals/Urinary Tract: Both adrenal glands appear normal. Both kidneys appear normal without evidence of hydronephrosis, focal mass lesion or perinephric soft tissue stranding. No evidence of urinary tract calculus. The bladder is mildly distended without apparent focal abnormality. Stomach/Bowel: As above, large hiatal hernia. There is a long segment of abnormal circumferential small bowel wall thickening in the mid and right abdomen. The distal small bowel appears normal. There is no high-grade bowel obstruction. There is edema throughout the mesenteric fat. Mild diverticular changes are  present within the distal colon. No definite extraluminal air or fluid collections are identified, although there is an atypical collection of gas in the mid abdomen (image 42) which appears to be within the small bowel lumen based on the reformatted images. Vascular/Lymphatic: There are no enlarged abdominal or pelvic lymph nodes. Mild atherosclerosis of the aorta, its branches and the iliac arteries. Vascular assessment limited without contrast. Reproductive: Unremarkable. Other: There is a midline supraumbilical hernia containing only fat, measuring up to 6.1 cm in diameter. No herniated bowel or soft tissue mass identified. Musculoskeletal: No acute or significant osseous findings. There are posttraumatic deformities of the right pubic rami. There are degenerative changes throughout the lumbar spine associate with a convex right scoliosis and grade 1 anterolisthesis at L3-4. Superior endplate compression deformity at T10 does not appear acute. IMPRESSION: 1. Abnormal long segment mid small bowel wall thickening with surrounding mesenteric edema suspicious for ischemia or bowel hemorrhage. No high-grade obstruction or signs of perforation. 2. Patient's palpable concern may correspond with a supraumbilical hernia containing only fat. There is no herniated bowel. 3. Large hiatal hernia.  4. These results were called by telephone at the time of interpretation on 10/14/2015 at 2:03 pm to Dr. Shirlyn Goltz , who verbally acknowledged these results. Electronically Signed   By: Richardean Sale M.D.   On: 10/14/2015 14:05    EKG none  Assessment/Plan Active Problems:   Abnormal CT scan, small bowel   IRON DEFICIENCY   DEPRESSION/ANXIETY   Gait instability   Unspecified protein-calorie malnutrition (HCC)   Small bowel edema   CKD (chronic kidney disease) stage 3, GFR 30-59 ml/min   Dementia   Leukocytosis   GERD (gastroesophageal reflux disease)   Hypothyroidism  #1 abnormal CT scan, small bowel/small bowel edema patient presented with nausea diffuse midabdominal pain. CT scan of the abdomen and pelvis done without contrast due to contrast allergy shows an area with thickened walled small bowel with edema. Differential includes infectious versus ischemia . Will place patient on oxygen, gentle hydration, keep nothing by mouth. Place empirically on IV ciprofloxacin and IV Flagyl. General surgery will be consulting on patient. ED physician.   #2 iron deficiency anemia H&H stable. Follow.  #3 leukocytosis Likely a stress of the margination. Urinalysis is negative. Chest x-ray is negative. Patient is afebrile. Patient will be placed empirically on IV cipro and IV Flagyl.  #4 hypothyroidism Check a TSH. Continue home dose Synthroid.  #6 chronic kidney disease stage III Stable.  #7 gastroesophageal reflux disease PPI.  #8 dementia Stable. Continue home regimen of Namenda.  #9 hyperlipidemia Continue statin.  #10 prophylaxis SCDs for DVT prophylaxis.  Code Status: Full DVT Prophylaxis: Lovenox Family Communication: Updated patient and daughter at bedside. Disposition Plan: Admit to medsurg.  Time spent: [facility] Pager [phone]

## 2015-10-15 LAB — PROTIME-INR
INR: 1.14 (ref 0.00–1.49)
Prothrombin Time: 14.8 seconds (ref 11.6–15.2)

## 2015-10-15 LAB — COMPREHENSIVE METABOLIC PANEL
ALT: 14 U/L (ref 14–54)
ANION GAP: 7 (ref 5–15)
AST: 17 U/L (ref 15–41)
Albumin: 3.2 g/dL — ABNORMAL LOW (ref 3.5–5.0)
Alkaline Phosphatase: 69 U/L (ref 38–126)
BUN: 18 mg/dL (ref 6–20)
CHLORIDE: 110 mmol/L (ref 101–111)
CO2: 25 mmol/L (ref 22–32)
Calcium: 8.2 mg/dL — ABNORMAL LOW (ref 8.9–10.3)
Creatinine, Ser: 1.2 mg/dL — ABNORMAL HIGH (ref 0.44–1.00)
GFR, EST AFRICAN AMERICAN: 46 mL/min — AB (ref >60)
GFR, EST NON AFRICAN AMERICAN: 40 mL/min — AB (ref >60)
Glucose, Bld: 117 mg/dL — ABNORMAL HIGH (ref 65–99)
POTASSIUM: 4.3 mmol/L (ref 3.5–5.1)
SODIUM: 142 mmol/L (ref 135–145)
Total Bilirubin: 0.7 mg/dL (ref 0.3–1.2)
Total Protein: 5.6 g/dL — ABNORMAL LOW (ref 6.5–8.1)

## 2015-10-15 LAB — CBC
HCT: 37.1 % (ref 36.0–46.0)
Hemoglobin: 11.6 g/dL — ABNORMAL LOW (ref 12.0–15.0)
MCH: 31.8 pg (ref 26.0–34.0)
MCHC: 31.3 g/dL (ref 30.0–36.0)
MCV: 101.6 fL — AB (ref 78.0–100.0)
PLATELETS: 198 10*3/uL (ref 150–400)
RBC: 3.65 MIL/uL — AB (ref 3.87–5.11)
RDW: 13.2 % (ref 11.5–15.5)
WBC: 6.8 10*3/uL (ref 4.0–10.5)

## 2015-10-15 LAB — TSH: TSH: 0.451 u[IU]/mL (ref 0.350–4.500)

## 2015-10-15 MED ORDER — ENOXAPARIN SODIUM 30 MG/0.3ML ~~LOC~~ SOLN
30.0000 mg | SUBCUTANEOUS | Status: DC
  Administered 2015-10-15 – 2015-10-19 (×5): 30 mg via SUBCUTANEOUS
  Filled 2015-10-15 (×6): qty 0.3

## 2015-10-15 NOTE — Progress Notes (Signed)
[facility] PROGRESS NOTE  Melanie Levy [id number] DOB: 01/12/1928 DOA: 10/14/2015 PCP: Donnajean Lopes, MD  Assessment/Plan: #1 abnormal CT scan small bowel Possible enteritis versus inflammatory versus ischemic bowel. Patient with clinical improvement. Leukocytosis is trending down. Continue empiric IV antibiotics. Will place patient on clear liquids and advance slowly as tolerated. General surgery following and appreciate input and recommendations.  #2 reducible hernia Per general surgery. Supportive care.  #3 iron deficiency anemia H&H stable. Nu iron. Follow.  #4 leukocytosis Likely stress demargination. Leukocytosis improved. Urinalysis negative. Chest x-ray negative. Patient afebrile. Continue empiric antibiotics.  #5 hypothyroidism TSH was 0.451. Continue Synthroid.  #6 chronic kidney disease stage III Stable.  #7 gastroesophageal reflux disease PPI.  #8 dementia Stable. Continue Namenda.  #9 hyperlipidemia Statin.  #10 prophylaxis SCDs for DVT prophylaxis.  Code Status: Full Family Communication: Updated patient. No family present. Disposition Plan: Home when tolerating oral intake, with resolution of abdominal pain.   Consultants:  Surgery: Dr Excell Seltzer 10/14/2015  Procedures:  CT abd/pelvis 10/14/2015    Antibiotics:  IV ciprofloxacin 10/14/2015  IV Flagyl 10/14/2015  HPI/Subjective: Patient states she's feeling much better. Patient denies any abdominal pain. No nausea no vomiting.  Objective: Filed Vitals:   10/14/15 2200 10/15/15 0600  BP: 123/48 140/55  Pulse: 70 83  Temp: 98.8 F (37.1 C) 98.4 F (36.9 C)  Resp: 16 16    Intake/Output Summary (Last 24 hours) at 10/15/15 1234 Last data filed at 10/15/15 1000  Gross per 24 hour  Intake 1882.5 ml  Output      1 ml  Net 1881.5 ml   Filed Weights   10/14/15 1700 10/15/15 0500  Weight: 54.199 kg (119 lb 7.8 oz) 54.477 kg (120 lb 1.6 oz)    Exam:   General:   NAD  Cardiovascular: RRR  Respiratory: CTAB  Abdomen: Soft, nontender, nondistended, positive bowel sounds. Reducible hernia.  Musculoskeletal: No clubbing cyanosis or edema.  Data Reviewed: Basic Metabolic Panel:  Recent Labs Lab 10/14/15 1222 10/15/15 0810  NA 142 142  K 4.6 4.3  CL 106 110  CO2 27 25  GLUCOSE 110* 117*  BUN 26* 18  CREATININE 1.16* 1.20*  CALCIUM 9.0 8.2*  MG 2.2  --    Liver Function Tests:  Recent Labs Lab 10/14/15 1222 10/15/15 0810  AST 24 17  ALT 16 14  ALKPHOS 84 69  BILITOT 0.8 0.7  PROT 6.8 5.6*  ALBUMIN 4.0 3.2*    Recent Labs Lab 10/14/15 1222  LIPASE 54*   No results for input(s): AMMONIA in the last 168 hours. CBC:  Recent Labs Lab 10/14/15 1222 10/15/15 0810  WBC 13.4* 6.8  NEUTROABS 11.9*  --   HGB 13.5 11.6*  HCT 42.5 37.1  MCV 101.0* 101.6*  PLT 225 198   Cardiac Enzymes: No results for input(s): CKTOTAL, CKMB, CKMBINDEX, TROPONINI in the last 168 hours. BNP (last 3 results) No results for input(s): BNP in the last 8760 hours.  ProBNP (last 3 results) No results for input(s): PROBNP in the last 8760 hours.  CBG: No results for input(s): GLUCAP in the last 168 hours.  No results found for this or any previous visit (from the past 240 hour(s)).   Studies: Ct Abdomen Pelvis Wo Contrast  10/14/2015  CLINICAL DATA:  Abdominal pain with soft tissue abdominal mass. History of dementia and anemia. EXAM: CT ABDOMEN AND PELVIS WITHOUT CONTRAST TECHNIQUE: Multidetector CT imaging of the abdomen and pelvis was performed following the standard  protocol without IV contrast. COMPARISON:  Abdominal CT 07/29/2003.  Pelvic CT 10/13/2013. FINDINGS: Lower chest: The lung bases are clear. There is no significant pleural or pericardial effusion. There is a large hiatal hernia which has enlarged compared with the prior study. 3 cm low-density lesion along the right hemidiaphragm is unchanged, likely an incidental diaphragmatic or  bronchogenic cyst. Hepatobiliary: There is a 2.8 cm cyst in the left hepatic lobe on image number 18 which has enlarged. No suspicious hepatic findings. Mild extrahepatic biliary prominence status post cholecystectomy, within physiologic limits. Pancreas: Atrophied without focal abnormality or surrounding inflammation. Spleen: Normal in size without focal abnormality. Adrenals/Urinary Tract: Both adrenal glands appear normal. Both kidneys appear normal without evidence of hydronephrosis, focal mass lesion or perinephric soft tissue stranding. No evidence of urinary tract calculus. The bladder is mildly distended without apparent focal abnormality. Stomach/Bowel: As above, large hiatal hernia. There is a long segment of abnormal circumferential small bowel wall thickening in the mid and right abdomen. The distal small bowel appears normal. There is no high-grade bowel obstruction. There is edema throughout the mesenteric fat. Mild diverticular changes are present within the distal colon. No definite extraluminal air or fluid collections are identified, although there is an atypical collection of gas in the mid abdomen (image 42) which appears to be within the small bowel lumen based on the reformatted images. Vascular/Lymphatic: There are no enlarged abdominal or pelvic lymph nodes. Mild atherosclerosis of the aorta, its branches and the iliac arteries. Vascular assessment limited without contrast. Reproductive: Unremarkable. Other: There is a midline supraumbilical hernia containing only fat, measuring up to 6.1 cm in diameter. No herniated bowel or soft tissue mass identified. Musculoskeletal: No acute or significant osseous findings. There are posttraumatic deformities of the right pubic rami. There are degenerative changes throughout the lumbar spine associate with a convex right scoliosis and grade 1 anterolisthesis at L3-4. Superior endplate compression deformity at T10 does not appear acute. IMPRESSION: 1.  Abnormal long segment mid small bowel wall thickening with surrounding mesenteric edema suspicious for ischemia or bowel hemorrhage. No high-grade obstruction or signs of perforation. 2. Patient's palpable concern may correspond with a supraumbilical hernia containing only fat. There is no herniated bowel. 3. Large hiatal hernia. 4. These results were called by telephone at the time of interpretation on 10/14/2015 at 2:03 pm to Dr. Shirlyn Goltz , who verbally acknowledged these results. Electronically Signed   By: Richardean Sale M.D.   On: 10/14/2015 14:05    Scheduled Meds: . antiseptic oral rinse  7 mL Mouth Rinse q12n4p  . arformoterol  15 mcg Nebulization BID   And  . budesonide (PULMICORT) nebulizer solution  0.25 mg Nebulization BID  . atorvastatin  10 mg Oral q1800  . chlorhexidine  15 mL Mouth Rinse BID  . ciprofloxacin  400 mg Intravenous Q12H  . docusate sodium  100 mg Oral BID  . enoxaparin (LOVENOX) injection  30 mg Subcutaneous Q24H  . FLUoxetine  20 mg Oral Daily  . iron polysaccharides  150 mg Oral BID  . levothyroxine  75 mcg Oral QAC breakfast  . loratadine  10 mg Oral Daily  . LORazepam  0.5 mg Oral BID  . memantine  28 mg Oral Daily  . metronidazole  500 mg Intravenous Once  . metronidazole  500 mg Intravenous Q8H  . mirtazapine  15 mg Oral QHS  . multivitamin-lutein  1 capsule Oral Daily  . pantoprazole  40 mg Oral Daily   Continuous  Infusions: . sodium chloride 75 mL/hr at 10/15/15 0534    Active Problems:   Abnormal CT scan, small bowel   IRON DEFICIENCY   DEPRESSION/ANXIETY   Gait instability   Unspecified protein-calorie malnutrition (HCC)   Small bowel edema   CKD (chronic kidney disease) stage 3, GFR 30-59 ml/min   Dementia   Leukocytosis   GERD (gastroesophageal reflux disease)   Hypothyroidism    Time spent: 35 mins    [facility] MD [facility] Pager [phone]. If 7PM-7AM, please contact night-coverage at [email or web address], password  [facility] 10/15/2015, 12:34 PM  LOS: 1 day

## 2015-10-15 NOTE — Progress Notes (Signed)
Occupational Therapy Evaluation Patient Details Name: Melanie Levy MRN: [id number] DOB: [birth year] Today's Date: 10/15/2015    History of Present Illness 81 yo F With history of diverticulosis, esophageal stricture, iron deficiency anemia, history of cholecystectomy, appendectomy, abdominal hysterectomy, dementia, depression, anxiety, diverticulosis, prior history of partial small bowel obstruction which resolved nonoperatively, gastroesophageal reflux disease presented to the ED with sudden onset of diffuse abdominal pain.    Clinical Impression   Patient appears to be at baseline with ADLs and functional mobility. She required extensive assistance PTA from staff at her ALF. No further OT needs. Will sign off.    Follow Up Recommendations  Supervision/Assistance - 24 hour;Other (comment) (back to ALF)    Equipment Recommendations  None recommended by OT    Recommendations for Other Services       Precautions / Restrictions Precautions Precautions: Fall Restrictions Weight Bearing Restrictions: No      Mobility Bed Mobility Overal bed mobility: Needs Assistance Bed Mobility: Rolling;Supine to Sit;Sit to Supine Rolling: Mod assist   Supine to sit: Mod assist;Max assist Sit to supine: Max assist;Mod assist      Transfers Overall transfer level: Needs assistance Equipment used: 1 person hand held assist Transfers: Sit to/from Stand Sit to Stand: Max assist         General transfer comment: very unsteady on feet initially; got better with time. Sidesteps 2-3 to left toward HOB max A.    Balance                                            ADL Overall ADL's : Needs assistance/impaired Eating/Feeding: NPO   Grooming: Wash/dry hands;Set up;Bed level       Lower Body Bathing: Total assistance;Bed level               Toileting- Clothing Manipulation and Hygiene: Total assistance;Bed level       Functional mobility during ADLs:  Maximal assistance General ADL Comments: Patient received in bed. Reports she thinks she has urinated. Patient found to be incontinent of urine. Required total A to perform peri hygiene in bed and change bed linens. Patient was able to assist with rolling. She also agreed to sitting EOB. Mod/max A bed mobility to EOB. Sit to stand x 1 with max A and max A to take 2-3 sidesteps to left toward [facility]. Returned to supine at end of session with bed alarm in place.     Vision     Perception     Praxis      Pertinent Vitals/Pain Pain Assessment: Faces Faces Pain Scale: No hurt     Hand Dominance     Extremity/Trunk Assessment Upper Extremity Assessment Upper Extremity Assessment: Generalized weakness   Lower Extremity Assessment Lower Extremity Assessment: Generalized weakness       Communication Communication Communication: HOH   Cognition Arousal/Alertness: Awake/alert Behavior During Therapy: WFL for tasks assessed/performed Overall Cognitive Status: History of cognitive impairments - at baseline       Memory: Decreased recall of precautions;Decreased short-term memory             General Comments       Exercises       Shoulder Instructions      Home Living Family/patient expects to be discharged to:: Assisted living  Home Equipment: Wheelchair - manual   Additional Comments: Patient unable to report; this information was obtained from chart review      Prior Functioning/Environment Level of Independence: Needs assistance  Gait / Transfers Assistance Needed: needs assistance for transfers and uses w/c per chart review ADL's / Homemaking Assistance Needed: needs assistance with most ADLs per chart review   Comments: patient unable to report; information obtained from chart review    OT Diagnosis: Generalized weakness;Cognitive deficits   OT Problem List: Decreased strength;Decreased activity tolerance;Impaired  balance (sitting and/or standing);Decreased cognition;Decreased safety awareness;Decreased knowledge of precautions   OT Treatment/Interventions:      OT Goals(Current goals can be found in the care plan section) Acute Rehab OT Goals Patient Stated Goal: none stated OT Goal Formulation: All assessment and education complete, DC therapy  OT Frequency:     Barriers to D/C:            Co-evaluation              End of Session    Activity Tolerance: Patient tolerated treatment well Patient left: in bed;with call bell/phone within reach;with bed alarm set   Time: 1034-1050 OT Time Calculation (min): 16 min Charges:  OT General Charges $OT Visit: 1 Procedure OT Evaluation $Initial OT Evaluation Tier I: 1 Procedure G-Codes:    Marsden Zaino A 10-25-15, 11:30 AM

## 2015-10-15 NOTE — Clinical Social Work Note (Signed)
Clinical Social Work Assessment  Patient Details  Name: Melanie Levy MRN: [id number] Date of Birth: 1928/09/15  Date of referral:  10/15/15               Reason for consult:  Facility Placement                Permission sought to share information with:  Family Supports Permission granted to share information::  No (pt disoriented)  Name::     [place]::  [place]  Relationship::  dtr  Contact Information:     Housing/Transportation Living arrangements for the past 2 months:  [place] of Information:  Spouse Patient Interpreter Needed:  None Criminal Activity/Legal Involvement Pertinent to Current Situation/Hospitalization:    Significant Relationships:  Adult Children Lives with:  Facility Resident Do you feel safe going back to the place where you live?  Yes Need for family participation in patient care:  Yes (Comment) (decision making)  Care giving concerns:  None- pt is long term resident at [facility]   Social Worker assessment / plan:  CSW spoke with pt dtr concerning pt return to [place] when ready for DC.  Employment status:  Retired Nurse, adult, Medicaid In [place] PT Recommendations:  Not assessed at this time [place] / Referral to community resources:     Patient/Family's Response to care:  Pt dtr confirmed plan for pt to return to [place] when ready for DC from hospital.  Patient/Family's Understanding of and Emotional Response to Diagnosis, Current Treatment, and Prognosis:  No questions or concerns at this time- pt dtr is hopeful pt can return to ALF soon.  Emotional Assessment Appearance:  Appears stated age Attitude/Demeanor/Rapport:  Unable to Assess Affect (typically observed):  Unable to Assess Orientation:  Oriented to Self Alcohol / Substance use:  Not Applicable Psych involvement (Current and /or in the community):  No (Comment)  Discharge Needs   Concerns to be addressed:  Care Coordination Readmission within the last 30 days:  No Current discharge risk:  None Barriers to Discharge:  Continued Medical Work up   [employer], LCSW 10/15/2015, 1:26 PM

## 2015-10-15 NOTE — Progress Notes (Signed)
Patient ID: Melanie Levy, female   DOB: 28-Sep-1928, 81 y.o.   MRN: [id number]    Subjective: Patient alert and cooperative this morning and denies any abdominal pain. No nausea or vomiting. No bowel movements.  Objective: Vital signs in last 24 hours: Temp:  [98.4 F (36.9 C)-98.8 F (37.1 C)] 98.4 F (36.9 C) (12/04 0600) Pulse Rate:  [70-102] 83 (12/04 0600) Resp:  [16-18] 16 (12/04 0600) BP: (123-167)/(48-77) 140/55 mmHg (12/04 0600) SpO2:  [94 %-99 %] 95 % (12/04 0600) Weight:  [54.199 kg (119 lb 7.8 oz)-54.477 kg (120 lb 1.6 oz)] 54.477 kg (120 lb 1.6 oz) (12/04 0500) Last BM Date: 10/14/15  Intake/Output from previous day: 12/03 0701 - 12/04 0700 In: 982.5 [I.V.:782.5; IV Piggyback:200] Out: 1 [Urine:1] Intake/Output this shift:    General appearance: alert, cooperative and no distress GI: generally soft and nontender and nondistended. Epigastric incisional hernia  is mildly tender and soft and easily reducible.  Lab Results:   Recent Labs  10/14/15 1222 10/15/15 0810  WBC 13.4* 6.8  HGB 13.5 11.6*  HCT 42.5 37.1  PLT 225 198   BMET  Recent Labs  10/14/15 1222  NA 142  K 4.6  CL 106  CO2 27  GLUCOSE 110*  BUN 26*  CREATININE 1.16*  CALCIUM 9.0     Studies/Results: Ct Abdomen Pelvis Wo Contrast  10/14/2015  CLINICAL DATA:  Abdominal pain with soft tissue abdominal mass. History of dementia and anemia. EXAM: CT ABDOMEN AND PELVIS WITHOUT CONTRAST TECHNIQUE: Multidetector CT imaging of the abdomen and pelvis was performed following the standard protocol without IV contrast. COMPARISON:  Abdominal CT 07/29/2003.  Pelvic CT 10/13/2013. FINDINGS: Lower chest: The lung bases are clear. There is no significant pleural or pericardial effusion. There is a large hiatal hernia which has enlarged compared with the prior study. 3 cm low-density lesion along the right hemidiaphragm is unchanged, likely an incidental diaphragmatic or bronchogenic cyst. Hepatobiliary:  There is a 2.8 cm cyst in the left hepatic lobe on image number 18 which has enlarged. No suspicious hepatic findings. Mild extrahepatic biliary prominence status post cholecystectomy, within physiologic limits. Pancreas: Atrophied without focal abnormality or surrounding inflammation. Spleen: Normal in size without focal abnormality. Adrenals/Urinary Tract: Both adrenal glands appear normal. Both kidneys appear normal without evidence of hydronephrosis, focal mass lesion or perinephric soft tissue stranding. No evidence of urinary tract calculus. The bladder is mildly distended without apparent focal abnormality. Stomach/Bowel: As above, large hiatal hernia. There is a long segment of abnormal circumferential small bowel wall thickening in the mid and right abdomen. The distal small bowel appears normal. There is no high-grade bowel obstruction. There is edema throughout the mesenteric fat. Mild diverticular changes are present within the distal colon. No definite extraluminal air or fluid collections are identified, although there is an atypical collection of gas in the mid abdomen (image 42) which appears to be within the small bowel lumen based on the reformatted images. Vascular/Lymphatic: There are no enlarged abdominal or pelvic lymph nodes. Mild atherosclerosis of the aorta, its branches and the iliac arteries. Vascular assessment limited without contrast. Reproductive: Unremarkable. Other: There is a midline supraumbilical hernia containing only fat, measuring up to 6.1 cm in diameter. No herniated bowel or soft tissue mass identified. Musculoskeletal: No acute or significant osseous findings. There are posttraumatic deformities of the right pubic rami. There are degenerative changes throughout the lumbar spine associate with a convex right scoliosis and grade 1 anterolisthesis at L3-4. Superior  endplate compression deformity at T10 does not appear acute. IMPRESSION: 1. Abnormal long segment mid small bowel  wall thickening with surrounding mesenteric edema suspicious for ischemia or bowel hemorrhage. No high-grade obstruction or signs of perforation. 2. Patient's palpable concern may correspond with a supraumbilical hernia containing only fat. There is no herniated bowel. 3. Large hiatal hernia. 4. These results were called by telephone at the time of interpretation on 10/14/2015 at 2:03 pm to Dr. Shirlyn Goltz , who verbally acknowledged these results. Electronically Signed   By: Richardean Sale M.D.   On: 10/14/2015 14:05    Anti-infectives: Anti-infectives    Start     Dose/Rate Route Frequency Ordered Stop   10/14/15 1800  metroNIDAZOLE (FLAGYL) IVPB 500 mg     500 mg 100 mL/hr over 60 Minutes Intravenous Every 8 hours 10/14/15 1633     10/14/15 1545  ciprofloxacin (CIPRO) IVPB 400 mg     400 mg 200 mL/hr over 60 Minutes Intravenous Every 12 hours 10/14/15 1541     10/14/15 1530  ciprofloxacin (CIPRO) IVPB 400 mg  Status:  Discontinued     400 mg 200 mL/hr over 60 Minutes Intravenous Every 12 hours 10/14/15 1523 10/14/15 1541   10/14/15 1530  metroNIDAZOLE (FLAGYL) IVPB 500 mg  Status:  Discontinued     500 mg 100 mL/hr over 60 Minutes Intravenous Every 8 hours 10/14/15 1523 10/14/15 1633   10/14/15 1430  metroNIDAZOLE (FLAGYL) IVPB 500 mg     500 mg 100 mL/hr over 60 Minutes Intravenous  Once 10/14/15 1415     10/14/15 1415  ciprofloxacin (CIPRO) IVPB 400 mg     400 mg 200 mL/hr over 60 Minutes Intravenous  Once 10/14/15 1415 10/14/15 1634      Assessment/Plan: Acute abdominal pain. Reducible incisional hernia. Enteritis by CT scan. Patient appears significantly improved today, denying abdominal pain and white blood count is normal. I'm suspicious her pain came from her incisional hernia but she does also have enteritis by CT scan of uncertain etiology. I do not think that that is related to her hernia. Would consider starting diet and advancing slowly. Continue to follow. Not a very good  candidate for hernia repair.    LOS: 1 day    Malka Bocek T 10/15/2015

## 2015-10-15 NOTE — Progress Notes (Signed)
Utilization Review Completed.Mckinnon Glick T12/02/2015  

## 2015-10-15 NOTE — Progress Notes (Signed)
PT Cancellation Note / Screen  Patient Details Name: Melanie Levy MRN: [id number] DOB: 10-14-1928   Cancelled Treatment:    Reason Eval/Treat Not Completed: PT screened, no needs identified, will sign off  Per chart review, Pt with dementia requires assist with transfers to w/c and is nonambulatory at ALF at baseline.  Discussed mobility with OT and feel pt is likely at her baseline.  Will defer any further PT needs to ALF.   Neysha Criado,KATHrine E 10/15/2015, 12:18 PM Carmelia Bake, PT, DPT 10/15/2015 Pager: [phone]

## 2015-10-15 NOTE — Progress Notes (Signed)
CSW confirmed with facility that pt is resident at [facility] [phone])  [place] will continue to follow  Domenica Reamer, [place] Social Worker [phone]

## 2015-10-16 ENCOUNTER — Inpatient Hospital Stay (HOSPITAL_COMMUNITY): Payer: Medicare Other

## 2015-10-16 LAB — CBC
HEMATOCRIT: 35 % — AB (ref 36.0–46.0)
HEMOGLOBIN: 11 g/dL — AB (ref 12.0–15.0)
MCH: 32 pg (ref 26.0–34.0)
MCHC: 31.4 g/dL (ref 30.0–36.0)
MCV: 101.7 fL — ABNORMAL HIGH (ref 78.0–100.0)
Platelets: 174 10*3/uL (ref 150–400)
RBC: 3.44 MIL/uL — AB (ref 3.87–5.11)
RDW: 13.3 % (ref 11.5–15.5)
WBC: 6.2 10*3/uL (ref 4.0–10.5)

## 2015-10-16 LAB — BASIC METABOLIC PANEL
ANION GAP: 6 (ref 5–15)
BUN: 14 mg/dL (ref 6–20)
CALCIUM: 7.8 mg/dL — AB (ref 8.9–10.3)
CO2: 25 mmol/L (ref 22–32)
Chloride: 111 mmol/L (ref 101–111)
Creatinine, Ser: 1.08 mg/dL — ABNORMAL HIGH (ref 0.44–1.00)
GFR, EST AFRICAN AMERICAN: 53 mL/min — AB (ref >60)
GFR, EST NON AFRICAN AMERICAN: 45 mL/min — AB (ref >60)
GLUCOSE: 90 mg/dL (ref 65–99)
POTASSIUM: 3.9 mmol/L (ref 3.5–5.1)
Sodium: 142 mmol/L (ref 135–145)

## 2015-10-16 LAB — MAGNESIUM: MAGNESIUM: 1.7 mg/dL (ref 1.7–2.4)

## 2015-10-16 MED ORDER — MAGNESIUM SULFATE 2 GM/50ML IV SOLN
2.0000 g | Freq: Once | INTRAVENOUS | Status: AC
  Administered 2015-10-16: 2 g via INTRAVENOUS
  Filled 2015-10-16: qty 50

## 2015-10-16 MED ORDER — PROCHLORPERAZINE EDISYLATE 5 MG/ML IJ SOLN
10.0000 mg | Freq: Four times a day (QID) | INTRAMUSCULAR | Status: DC | PRN
  Administered 2015-10-16: 10 mg via INTRAVENOUS
  Filled 2015-10-16: qty 2

## 2015-10-16 NOTE — Progress Notes (Signed)
Pt becoming confused. Earlier she knew where she was and now she doesn't know where she is or how she got here. Reoriented to how and why she came here. Pt brushing her teeth.

## 2015-10-16 NOTE — Progress Notes (Signed)
[facility] PROGRESS NOTE  Melanie Levy [id number] DOB: January 10, 1928 DOA: 10/14/2015 PCP: Donnajean Lopes, MD  Assessment/Plan: #1 abnormal CT scan small bowel Possible enteritis versus inflammatory versus ischemic bowel. Patient with nausea and complaining of abdominal pain this morning. Leukocytosis is trending down. Continue empiric IV antibiotics. Patient's diet has been advanced per general surgery. Will get an acute abdominal series. Continue supportive care. General surgery following and appreciate input and recommendations.  #2 reducible hernia Per general surgery. Supportive care.  #3 iron deficiency anemia H&H stable. Nu iron. Follow.  #4 leukocytosis Likely stress demargination. Leukocytosis improved. Urinalysis negative. Chest x-ray negative. Patient afebrile. Continue empiric antibiotics.  #5 hypothyroidism TSH was 0.451. Continue Synthroid.  #6 chronic kidney disease stage III Stable.  #7 gastroesophageal reflux disease PPI.  #8 dementia Stable. Continue Namenda.  #9 hyperlipidemia Statin.  #10 prophylaxis SCDs for DVT prophylaxis.  Code Status: Full Family Communication: Updated patient. No family present. Disposition Plan: Home when tolerating oral intake, with resolution of abdominal pain.   Consultants:  Surgery: Dr Excell Seltzer 10/14/2015  Procedures:  CT abd/pelvis 10/14/2015    Antibiotics:  IV ciprofloxacin 10/14/2015  IV Flagyl 10/14/2015  HPI/Subjective: Patient c/o abdominal pain and nausea.  Objective: Filed Vitals:   10/15/15 2100 10/16/15 0630  BP: 154/63 167/77  Pulse: 66 71  Temp: 98.4 F (36.9 C) 98.2 F (36.8 C)  Resp: 16 18    Intake/Output Summary (Last 24 hours) at 10/16/15 1052 Last data filed at 10/16/15 0600  Gross per 24 hour  Intake 1463.75 ml  Output      0 ml  Net 1463.75 ml   Filed Weights   10/14/15 1700 10/15/15 0500 10/16/15 0428  Weight: 54.199 kg (119 lb 7.8 oz) 54.477 kg (120 lb  1.6 oz) 54.613 kg (120 lb 6.4 oz)    Exam:   General:  NAD  Cardiovascular: RRR  Respiratory: CTAB  Abdomen: Soft, TTP in mid abdomen,  nondistended, positive bowel sounds. Reducible hernia.  Musculoskeletal: No clubbing cyanosis or edema.  Data Reviewed: Basic Metabolic Panel:  Recent Labs Lab 10/14/15 1222 10/15/15 0810 10/16/15 0510  NA 142 142 142  K 4.6 4.3 3.9  CL 106 110 111  CO2 27 25 25   GLUCOSE 110* 117* 90  BUN 26* 18 14  CREATININE 1.16* 1.20* 1.08*  CALCIUM 9.0 8.2* 7.8*  MG 2.2  --  1.7   Liver Function Tests:  Recent Labs Lab 10/14/15 1222 10/15/15 0810  AST 24 17  ALT 16 14  ALKPHOS 84 69  BILITOT 0.8 0.7  PROT 6.8 5.6*  ALBUMIN 4.0 3.2*    Recent Labs Lab 10/14/15 1222  LIPASE 54*   No results for input(s): AMMONIA in the last 168 hours. CBC:  Recent Labs Lab 10/14/15 1222 10/15/15 0810 10/16/15 0510  WBC 13.4* 6.8 6.2  NEUTROABS 11.9*  --   --   HGB 13.5 11.6* 11.0*  HCT 42.5 37.1 35.0*  MCV 101.0* 101.6* 101.7*  PLT 225 198 174   Cardiac Enzymes: No results for input(s): CKTOTAL, CKMB, CKMBINDEX, TROPONINI in the last 168 hours. BNP (last 3 results) No results for input(s): BNP in the last 8760 hours.  ProBNP (last 3 results) No results for input(s): PROBNP in the last 8760 hours.  CBG: No results for input(s): GLUCAP in the last 168 hours.  No results found for this or any previous visit (from the past 240 hour(s)).   Studies: Ct Abdomen Pelvis Wo Contrast  10/14/2015  CLINICAL DATA:  Abdominal pain with soft tissue abdominal mass. History of dementia and anemia. EXAM: CT ABDOMEN AND PELVIS WITHOUT CONTRAST TECHNIQUE: Multidetector CT imaging of the abdomen and pelvis was performed following the standard protocol without IV contrast. COMPARISON:  Abdominal CT 07/29/2003.  Pelvic CT 10/13/2013. FINDINGS: Lower chest: The lung bases are clear. There is no significant pleural or pericardial effusion. There is a large  hiatal hernia which has enlarged compared with the prior study. 3 cm low-density lesion along the right hemidiaphragm is unchanged, likely an incidental diaphragmatic or bronchogenic cyst. Hepatobiliary: There is a 2.8 cm cyst in the left hepatic lobe on image number 18 which has enlarged. No suspicious hepatic findings. Mild extrahepatic biliary prominence status post cholecystectomy, within physiologic limits. Pancreas: Atrophied without focal abnormality or surrounding inflammation. Spleen: Normal in size without focal abnormality. Adrenals/Urinary Tract: Both adrenal glands appear normal. Both kidneys appear normal without evidence of hydronephrosis, focal mass lesion or perinephric soft tissue stranding. No evidence of urinary tract calculus. The bladder is mildly distended without apparent focal abnormality. Stomach/Bowel: As above, large hiatal hernia. There is a long segment of abnormal circumferential small bowel wall thickening in the mid and right abdomen. The distal small bowel appears normal. There is no high-grade bowel obstruction. There is edema throughout the mesenteric fat. Mild diverticular changes are present within the distal colon. No definite extraluminal air or fluid collections are identified, although there is an atypical collection of gas in the mid abdomen (image 42) which appears to be within the small bowel lumen based on the reformatted images. Vascular/Lymphatic: There are no enlarged abdominal or pelvic lymph nodes. Mild atherosclerosis of the aorta, its branches and the iliac arteries. Vascular assessment limited without contrast. Reproductive: Unremarkable. Other: There is a midline supraumbilical hernia containing only fat, measuring up to 6.1 cm in diameter. No herniated bowel or soft tissue mass identified. Musculoskeletal: No acute or significant osseous findings. There are posttraumatic deformities of the right pubic rami. There are degenerative changes throughout the lumbar  spine associate with a convex right scoliosis and grade 1 anterolisthesis at L3-4. Superior endplate compression deformity at T10 does not appear acute. IMPRESSION: 1. Abnormal long segment mid small bowel wall thickening with surrounding mesenteric edema suspicious for ischemia or bowel hemorrhage. No high-grade obstruction or signs of perforation. 2. Patient's palpable concern may correspond with a supraumbilical hernia containing only fat. There is no herniated bowel. 3. Large hiatal hernia. 4. These results were called by telephone at the time of interpretation on 10/14/2015 at 2:03 pm to Dr. Shirlyn Goltz , who verbally acknowledged these results. Electronically Signed   By: Richardean Sale M.D.   On: 10/14/2015 14:05    Scheduled Meds: . antiseptic oral rinse  7 mL Mouth Rinse q12n4p  . arformoterol  15 mcg Nebulization BID   And  . budesonide (PULMICORT) nebulizer solution  0.25 mg Nebulization BID  . atorvastatin  10 mg Oral q1800  . chlorhexidine  15 mL Mouth Rinse BID  . ciprofloxacin  400 mg Intravenous Q12H  . docusate sodium  100 mg Oral BID  . enoxaparin (LOVENOX) injection  30 mg Subcutaneous Q24H  . FLUoxetine  20 mg Oral Daily  . iron polysaccharides  150 mg Oral BID  . levothyroxine  75 mcg Oral QAC breakfast  . loratadine  10 mg Oral Daily  . LORazepam  0.5 mg Oral BID  . memantine  28 mg Oral Daily  . metronidazole  500 mg Intravenous  Once  . metronidazole  500 mg Intravenous Q8H  . mirtazapine  15 mg Oral QHS  . multivitamin-lutein  1 capsule Oral Daily  . pantoprazole  40 mg Oral Daily   Continuous Infusions: . sodium chloride 50 mL/hr at 10/16/15 0212    Active Problems:   Abnormal CT scan, small bowel   IRON DEFICIENCY   DEPRESSION/ANXIETY   Gait instability   Unspecified protein-calorie malnutrition (HCC)   Small bowel edema   CKD (chronic kidney disease) stage 3, GFR 30-59 ml/min   Dementia   Leukocytosis   GERD (gastroesophageal reflux disease)    Hypothyroidism    Time spent: 35 mins    [facility] MD [facility] Pager [phone]. If 7PM-7AM, please contact night-coverage at [email or web address], password [facility] 10/16/2015, 10:52 AM  LOS: 2 days

## 2015-10-16 NOTE — Care Management Note (Signed)
Case Management Note  Patient Details  Name: JOLIYAH WOJNO MRN: [id number] Date of Birth: [birth year]  Subjective/Objective:   Admitted with Possible enteritis versus inflammatory versus ischemic bowel                 Action/Plan: Discharge planning per CSW  Expected Discharge Date:                  Expected Discharge Plan:  Assisted Living / Rest Home  In-House Referral:  Clinical Social Work  Discharge planning Services  CM Consult  Post Acute Care Choice:  NA Choice offered to:  NA  DME Arranged:  N/A DME Agency:  NA  HH Arranged:  NA HH Agency:  NA  Status of Service:  Completed, signed off  Medicare Important Message Given:    Date Medicare IM Given:    Medicare IM give by:    Date Additional Medicare IM Given:    Additional Medicare Important Message give by:     If discussed at [place] of Stay Meetings, dates discussed:    Additional Comments:  Guadalupe Maple, RN 10/16/2015, 1:25 PM

## 2015-10-16 NOTE — Progress Notes (Signed)
Subjective: Reports she has occasional pains in RUQ and epigastric areas. Thinks she passed some gas.  Objective: Vital signs in last 24 hours: Temp:  [98 F (36.7 C)-98.4 F (36.9 C)] 98.2 F (36.8 C) (12/05 0630) Pulse Rate:  [66-75] 71 (12/05 0630) Resp:  [16-18] 18 (12/05 0630) BP: (154-167)/(63-77) 167/77 mmHg (12/05 0630) SpO2:  [93 %-98 %] 93 % (12/05 0840) Weight:  [54.613 kg (120 lb 6.4 oz)] 54.613 kg (120 lb 6.4 oz) (12/05 0428) Last BM Date: 10/14/15  Intake/Output from previous day: 12/04 0701 - 12/05 0700 In: 2363.8 [I.V.:1663.8; IV Piggyback:700] Out: -  Intake/Output this shift:    PE: General- In NAD Abdomen-soft, not tender, reducible RUQ incision hernia  Lab Results:   Recent Labs  10/15/15 0810 10/16/15 0510  WBC 6.8 6.2  HGB 11.6* 11.0*  HCT 37.1 35.0*  PLT 198 174   BMET  Recent Labs  10/15/15 0810 10/16/15 0510  NA 142 142  K 4.3 3.9  CL 110 111  CO2 25 25  GLUCOSE 117* 90  BUN 18 14  CREATININE 1.20* 1.08*  CALCIUM 8.2* 7.8*   PT/INR  Recent Labs  10/15/15 0810  LABPROT 14.8  INR 1.14   Comprehensive Metabolic Panel:    Component Value Date/Time   NA 142 10/16/2015 0510   NA 142 10/15/2015 0810   K 3.9 10/16/2015 0510   K 4.3 10/15/2015 0810   CL 111 10/16/2015 0510   CL 110 10/15/2015 0810   CO2 25 10/16/2015 0510   CO2 25 10/15/2015 0810   BUN 14 10/16/2015 0510   BUN 18 10/15/2015 0810   CREATININE 1.08* 10/16/2015 0510   CREATININE 1.20* 10/15/2015 0810   GLUCOSE 90 10/16/2015 0510   GLUCOSE 117* 10/15/2015 0810   CALCIUM 7.8* 10/16/2015 0510   CALCIUM 8.2* 10/15/2015 0810   AST 17 10/15/2015 0810   AST 24 10/14/2015 1222   ALT 14 10/15/2015 0810   ALT 16 10/14/2015 1222   ALKPHOS 69 10/15/2015 0810   ALKPHOS 84 10/14/2015 1222   BILITOT 0.7 10/15/2015 0810   BILITOT 0.8 10/14/2015 1222   PROT 5.6* 10/15/2015 0810   PROT 6.8 10/14/2015 1222   ALBUMIN 3.2* 10/15/2015 0810   ALBUMIN 4.0 10/14/2015  1222     Studies/Results: Ct Abdomen Pelvis Wo Contrast  10/14/2015  CLINICAL DATA:  Abdominal pain with soft tissue abdominal mass. History of dementia and anemia. EXAM: CT ABDOMEN AND PELVIS WITHOUT CONTRAST TECHNIQUE: Multidetector CT imaging of the abdomen and pelvis was performed following the standard protocol without IV contrast. COMPARISON:  Abdominal CT 07/29/2003.  Pelvic CT 10/13/2013. FINDINGS: Lower chest: The lung bases are clear. There is no significant pleural or pericardial effusion. There is a large hiatal hernia which has enlarged compared with the prior study. 3 cm low-density lesion along the right hemidiaphragm is unchanged, likely an incidental diaphragmatic or bronchogenic cyst. Hepatobiliary: There is a 2.8 cm cyst in the left hepatic lobe on image number 18 which has enlarged. No suspicious hepatic findings. Mild extrahepatic biliary prominence status post cholecystectomy, within physiologic limits. Pancreas: Atrophied without focal abnormality or surrounding inflammation. Spleen: Normal in size without focal abnormality. Adrenals/Urinary Tract: Both adrenal glands appear normal. Both kidneys appear normal without evidence of hydronephrosis, focal mass lesion or perinephric soft tissue stranding. No evidence of urinary tract calculus. The bladder is mildly distended without apparent focal abnormality. Stomach/Bowel: As above, large hiatal hernia. There is a long segment of abnormal circumferential small bowel wall  thickening in the mid and right abdomen. The distal small bowel appears normal. There is no high-grade bowel obstruction. There is edema throughout the mesenteric fat. Mild diverticular changes are present within the distal colon. No definite extraluminal air or fluid collections are identified, although there is an atypical collection of gas in the mid abdomen (image 42) which appears to be within the small bowel lumen based on the reformatted images. Vascular/Lymphatic:  There are no enlarged abdominal or pelvic lymph nodes. Mild atherosclerosis of the aorta, its branches and the iliac arteries. Vascular assessment limited without contrast. Reproductive: Unremarkable. Other: There is a midline supraumbilical hernia containing only fat, measuring up to 6.1 cm in diameter. No herniated bowel or soft tissue mass identified. Musculoskeletal: No acute or significant osseous findings. There are posttraumatic deformities of the right pubic rami. There are degenerative changes throughout the lumbar spine associate with a convex right scoliosis and grade 1 anterolisthesis at L3-4. Superior endplate compression deformity at T10 does not appear acute. IMPRESSION: 1. Abnormal long segment mid small bowel wall thickening with surrounding mesenteric edema suspicious for ischemia or bowel hemorrhage. No high-grade obstruction or signs of perforation. 2. Patient's palpable concern may correspond with a supraumbilical hernia containing only fat. There is no herniated bowel. 3. Large hiatal hernia. 4. These results were called by telephone at the time of interpretation on 10/14/2015 at 2:03 pm to Dr. Shirlyn Goltz , who verbally acknowledged these results. Electronically Signed   By: Richardean Sale M.D.   On: 10/14/2015 14:05    Anti-infectives: Anti-infectives    Start     Dose/Rate Route Frequency Ordered Stop   10/14/15 1800  metroNIDAZOLE (FLAGYL) IVPB 500 mg     500 mg 100 mL/hr over 60 Minutes Intravenous Every 8 hours 10/14/15 1633     10/14/15 1545  ciprofloxacin (CIPRO) IVPB 400 mg     400 mg 200 mL/hr over 60 Minutes Intravenous Every 12 hours 10/14/15 1541     10/14/15 1530  ciprofloxacin (CIPRO) IVPB 400 mg  Status:  Discontinued     400 mg 200 mL/hr over 60 Minutes Intravenous Every 12 hours 10/14/15 1523 10/14/15 1541   10/14/15 1530  metroNIDAZOLE (FLAGYL) IVPB 500 mg  Status:  Discontinued     500 mg 100 mL/hr over 60 Minutes Intravenous Every 8 hours 10/14/15 1523  10/14/15 1633   10/14/15 1430  metroNIDAZOLE (FLAGYL) IVPB 500 mg     500 mg 100 mL/hr over 60 Minutes Intravenous  Once 10/14/15 1415     10/14/15 1415  ciprofloxacin (CIPRO) IVPB 400 mg     400 mg 200 mL/hr over 60 Minutes Intravenous  Once 10/14/15 1415 10/14/15 1634      Assessment Active Problems:  1.  Acute enteritis-WBC normal, abdomen not tender, WBC normal  2.  RUQ reducible incisional hernia containing fat  3.  Large hiatal hernia  4. Demential   LOS: 2 days   Plan: Advance to bland soft diet and see how she does.  Would continue abxs.   Charene Mccallister J 10/16/2015

## 2015-10-16 NOTE — Progress Notes (Signed)
CSW continuing to follow.   Pt admitted from [facility] and pt family plans for pt to return upon discharge.   CSW contacted  [facility] to update facility. CSW faxed clinical information per facility request.   Per MD, pt not yet medically ready for discharge.  CSW to continue to follow to discuss disposition needs.   Alison Murray, MSW, [place] Work [phone]

## 2015-10-17 DIAGNOSIS — R109 Unspecified abdominal pain: Secondary | ICD-10-CM

## 2015-10-17 LAB — CBC
HCT: 37.7 % (ref 36.0–46.0)
Hemoglobin: 11.8 g/dL — ABNORMAL LOW (ref 12.0–15.0)
MCH: 31.9 pg (ref 26.0–34.0)
MCHC: 31.3 g/dL (ref 30.0–36.0)
MCV: 101.9 fL — AB (ref 78.0–100.0)
PLATELETS: 183 10*3/uL (ref 150–400)
RBC: 3.7 MIL/uL — ABNORMAL LOW (ref 3.87–5.11)
RDW: 13.3 % (ref 11.5–15.5)
WBC: 6 10*3/uL (ref 4.0–10.5)

## 2015-10-17 LAB — BASIC METABOLIC PANEL
Anion gap: 7 (ref 5–15)
BUN: 10 mg/dL (ref 6–20)
CO2: 25 mmol/L (ref 22–32)
CREATININE: 1.28 mg/dL — AB (ref 0.44–1.00)
Calcium: 8.4 mg/dL — ABNORMAL LOW (ref 8.9–10.3)
Chloride: 111 mmol/L (ref 101–111)
GFR calc Af Amer: 43 mL/min — ABNORMAL LOW (ref >60)
GFR, EST NON AFRICAN AMERICAN: 37 mL/min — AB (ref >60)
GLUCOSE: 95 mg/dL (ref 65–99)
POTASSIUM: 3.9 mmol/L (ref 3.5–5.1)
SODIUM: 143 mmol/L (ref 135–145)

## 2015-10-17 MED ORDER — METRONIDAZOLE 500 MG PO TABS
500.0000 mg | ORAL_TABLET | Freq: Three times a day (TID) | ORAL | Status: DC
  Administered 2015-10-17 – 2015-10-20 (×10): 500 mg via ORAL
  Filled 2015-10-17 (×13): qty 1

## 2015-10-17 MED ORDER — CIPROFLOXACIN HCL 250 MG PO TABS
250.0000 mg | ORAL_TABLET | Freq: Two times a day (BID) | ORAL | Status: DC
  Administered 2015-10-17 – 2015-10-20 (×5): 250 mg via ORAL
  Filled 2015-10-17 (×7): qty 1

## 2015-10-17 NOTE — Progress Notes (Signed)
[facility] Progress Note     Subjective: Pt says she feels fine.  No abdominal pain or N/V currently.  Per nursing she had a large loose BM yesterday.  Low appetite per nursing staff.    Objective: Vital signs in last 24 hours: Temp:  [98.1 F (36.7 C)-98.8 F (37.1 C)] 98.1 F (36.7 C) (12/06 0436) Pulse Rate:  [69-70] 70 (12/06 0436) Resp:  [14-18] 14 (12/06 0436) BP: (146-161)/(56-68) 149/68 mmHg (12/06 0436) SpO2:  [93 %-97 %] 97 % (12/06 0436) Last BM Date: 10/16/15  Intake/Output from previous day: 12/05 0701 - 12/06 0700 In: 2140 [P.O.:540; I.V.:1200; IV Piggyback:400] Out: 1360 [Urine:1360] Intake/Output this shift:    PE: Gen:  Alert, NAD, pleasant Abd: Soft, NT/ND, +BS, no HSM   Lab Results:   Recent Labs  10/16/15 0510 10/17/15 0516  WBC 6.2 6.0  HGB 11.0* 11.8*  HCT 35.0* 37.7  PLT 174 183   BMET  Recent Labs  10/16/15 0510 10/17/15 0516  NA 142 143  K 3.9 3.9  CL 111 111  CO2 25 25  GLUCOSE 90 95  BUN 14 10  CREATININE 1.08* 1.28*  CALCIUM 7.8* 8.4*   PT/INR  Recent Labs  10/15/15 0810  LABPROT 14.8  INR 1.14   CMP     Component Value Date/Time   NA 143 10/17/2015 0516   K 3.9 10/17/2015 0516   CL 111 10/17/2015 0516   CO2 25 10/17/2015 0516   GLUCOSE 95 10/17/2015 0516   BUN 10 10/17/2015 0516   CREATININE 1.28* 10/17/2015 0516   CALCIUM 8.4* 10/17/2015 0516   PROT 5.6* 10/15/2015 0810   ALBUMIN 3.2* 10/15/2015 0810   AST 17 10/15/2015 0810   ALT 14 10/15/2015 0810   ALKPHOS 69 10/15/2015 0810   BILITOT 0.7 10/15/2015 0810   GFRNONAA 37* 10/17/2015 0516   GFRAA 43* 10/17/2015 0516   Lipase     Component Value Date/Time   LIPASE 54* 10/14/2015 1222       Studies/Results: Dg Abd Acute W/chest  10/16/2015  CLINICAL DATA:  Right lower quadrant pain.  Nausea EXAM: DG ABDOMEN ACUTE W/ 1V CHEST COMPARISON:  08/24/2013 chest x-ray FINDINGS: Previously administered oral contrast has reached the  nondilated colon, showing multiple colonic diverticula. There is no indication of small bowel obstruction. No evidence of pneumatosis or perforation. No cardiomegaly. Lower mediastinal widening from large hiatal hernia. There is no edema, consolidation, effusion, or pneumothorax. Remote right obturator ring fracture with pelvic asymmetry from displacement. IMPRESSION: 1. Nonobstructive bowel gas pattern. 2. No evidence of acute cardiopulmonary disease. Electronically Signed   By: Monte Fantasia M.D.   On: 10/16/2015 11:54    Anti-infectives: Anti-infectives    Start     Dose/Rate Route Frequency Ordered Stop   10/14/15 1800  metroNIDAZOLE (FLAGYL) IVPB 500 mg     500 mg 100 mL/hr over 60 Minutes Intravenous Every 8 hours 10/14/15 1633     10/14/15 1545  ciprofloxacin (CIPRO) IVPB 400 mg     400 mg 200 mL/hr over 60 Minutes Intravenous Every 12 hours 10/14/15 1541     10/14/15 1530  ciprofloxacin (CIPRO) IVPB 400 mg  Status:  Discontinued     400 mg 200 mL/hr over 60 Minutes Intravenous Every 12 hours 10/14/15 1523 10/14/15 1541   10/14/15 1530  metroNIDAZOLE (FLAGYL) IVPB 500 mg  Status:  Discontinued     500 mg 100 mL/hr over 60 Minutes Intravenous Every 8 hours 10/14/15 1523 10/14/15  1633   10/14/15 1430  metroNIDAZOLE (FLAGYL) IVPB 500 mg     500 mg 100 mL/hr over 60 Minutes Intravenous  Once 10/14/15 1415     10/14/15 1415  ciprofloxacin (CIPRO) IVPB 400 mg     400 mg 200 mL/hr over 60 Minutes Intravenous  Once 10/14/15 1415 10/14/15 1634       Assessment/Plan 1. Acute enteritis-WBC normal, abdomen not tender, WBC normal -Tolerating soft/bland diet, had a BM -Bowel regimen -Antibiotics seem to be helping, can consider finishing a 7 day course -Mobilize as able to aid in recovery, may need therapy -Xray yesterday shows no obstruction 2. RUQ reducible incisional hernia containing fat -No plans for surgical intervention given age and comorbidities 3. Large hiatal hernia 4.   Dementia  Okay to d/c home when medically stable, when tolerating adequate soft diet.  Will sign off, call with questions/concerns.    LOS: 3 days    Nat Christen 10/17/2015, 8:27 AM Pager: [phone]

## 2015-10-17 NOTE — Care Management Important Message (Signed)
Important Message  Patient Details  Name: MARQUETTA KOMAR MRN: [id number] Date of Birth: 12/06/1927   Medicare Important Message Given:  Yes    Camillo Flaming 10/17/2015, 12:08 [place] Message  Patient Details  Name: HELEENA LEHMKUHL MRN: [id number] Date of Birth: 02-28-1928   Medicare Important Message Given:  Yes    Camillo Flaming 10/17/2015, 12:08 PM

## 2015-10-17 NOTE — Progress Notes (Signed)
[facility] PROGRESS NOTE  Melanie Levy [id number] DOB: [birth year] DOA: 10/14/2015 PCP: Donnajean Lopes, MD  Assessment/Plan: #1 abnormal CT scan small bowel/probable acute enteritis Possible enteritis versus inflammatory versus ischemic bowel. Patient with improvement with nausea and no further abdominal pain today. Leukocytosis has resolved. Patient is afebrile. Change IV antibiotics to oral antibiotics to complete a seven-day course of antibiotic therapy. Patient on a soft diet which she tolerated for breakfast today. If continued improvement tolerating solids on oral antibiotics could possibly discharge back to facility tomorrow.   #2 reducible hernia Per general surgery no plans for surgical intervention due to age and comorbidities. Supportive care.  #3 iron deficiency anemia H&H stable. Nu iron. Follow.  #4 leukocytosis Likely stress demargination. Leukocytosis resolved. Urinalysis negative. Chest x-ray negative. Patient afebrile. Continue empiric antibiotics.  #5 hypothyroidism TSH was 0.451. Continue Synthroid.  #6 chronic kidney disease stage III Stable.  #7 gastroesophageal reflux disease PPI.  #8 dementia Stable. Continue Namenda.  #9 hyperlipidemia Statin.  #10 prophylaxis SCDs for DVT prophylaxis.  Code Status: Full Family Communication: Updated patient. No family present. Disposition Plan: Back to facility when tolerating oral intake and when on oral antibiotics hopefully tomorrow.   Consultants:  Surgery: Dr Excell Seltzer 10/14/2015  Procedures:  CT abd/pelvis 10/14/2015  Acute abdominal series 10/16/2015  Antibiotics:  IV ciprofloxacin 10/14/2015>>> oral ciprofloxacin 10/17/2015  IV Flagyl 10/14/2015>>>> oral ciprofloxacin 10/17/2015  HPI/Subjective: Patient denies abdominal pain. No nausea, no emesis. Ate 90% of breakfast when it was fed to her. Patient with BM yesterday.  Objective: Filed Vitals:   10/16/15 2134 10/17/15 0436   BP: 146/56 149/68  Pulse: 69 70  Temp: 98.2 F (36.8 C) 98.1 F (36.7 C)  Resp: 16 14    Intake/Output Summary (Last 24 hours) at 10/17/15 1026 Last data filed at 10/17/15 0600  Gross per 24 hour  Intake   1740 ml  Output   1360 ml  Net    380 ml   Filed Weights   10/14/15 1700 10/15/15 0500 10/16/15 0428  Weight: 54.199 kg (119 lb 7.8 oz) 54.477 kg (120 lb 1.6 oz) 54.613 kg (120 lb 6.4 oz)    Exam:   General:  NAD  Cardiovascular: RRR  Respiratory: CTAB  Abdomen: Soft, NTTP in mid abdomen,  nondistended, positive bowel sounds. Reducible hernia.  Musculoskeletal: No clubbing cyanosis or edema.  Data Reviewed: Basic Metabolic Panel:  Recent Labs Lab 10/14/15 1222 10/15/15 0810 10/16/15 0510 10/17/15 0516  NA 142 142 142 143  K 4.6 4.3 3.9 3.9  CL 106 110 111 111  CO2 27 25 25 25   GLUCOSE 110* 117* 90 95  BUN 26* 18 14 10   CREATININE 1.16* 1.20* 1.08* 1.28*  CALCIUM 9.0 8.2* 7.8* 8.4*  MG 2.2  --  1.7  --    Liver Function Tests:  Recent Labs Lab 10/14/15 1222 10/15/15 0810  AST 24 17  ALT 16 14  ALKPHOS 84 69  BILITOT 0.8 0.7  PROT 6.8 5.6*  ALBUMIN 4.0 3.2*    Recent Labs Lab 10/14/15 1222  LIPASE 54*   No results for input(s): AMMONIA in the last 168 hours. CBC:  Recent Labs Lab 10/14/15 1222 10/15/15 0810 10/16/15 0510 10/17/15 0516  WBC 13.4* 6.8 6.2 6.0  NEUTROABS 11.9*  --   --   --   HGB 13.5 11.6* 11.0* 11.8*  HCT 42.5 37.1 35.0* 37.7  MCV 101.0* 101.6* 101.7* 101.9*  PLT 225 198 174 183   Cardiac  Enzymes: No results for input(s): CKTOTAL, CKMB, CKMBINDEX, TROPONINI in the last 168 hours. BNP (last 3 results) No results for input(s): BNP in the last 8760 hours.  ProBNP (last 3 results) No results for input(s): PROBNP in the last 8760 hours.  CBG: No results for input(s): GLUCAP in the last 168 hours.  No results found for this or any previous visit (from the past 240 hour(s)).   Studies: Dg Abd Acute  W/chest  10/16/2015  CLINICAL DATA:  Right lower quadrant pain.  Nausea EXAM: DG ABDOMEN ACUTE W/ 1V CHEST COMPARISON:  08/24/2013 chest x-ray FINDINGS: Previously administered oral contrast has reached the nondilated colon, showing multiple colonic diverticula. There is no indication of small bowel obstruction. No evidence of pneumatosis or perforation. No cardiomegaly. Lower mediastinal widening from large hiatal hernia. There is no edema, consolidation, effusion, or pneumothorax. Remote right obturator ring fracture with pelvic asymmetry from displacement. IMPRESSION: 1. Nonobstructive bowel gas pattern. 2. No evidence of acute cardiopulmonary disease. Electronically Signed   By: Monte Fantasia M.D.   On: 10/16/2015 11:54    Scheduled Meds: . antiseptic oral rinse  7 mL Mouth Rinse q12n4p  . arformoterol  15 mcg Nebulization BID   And  . budesonide (PULMICORT) nebulizer solution  0.25 mg Nebulization BID  . atorvastatin  10 mg Oral q1800  . chlorhexidine  15 mL Mouth Rinse BID  . ciprofloxacin  400 mg Intravenous Q12H  . docusate sodium  100 mg Oral BID  . enoxaparin (LOVENOX) injection  30 mg Subcutaneous Q24H  . FLUoxetine  20 mg Oral Daily  . iron polysaccharides  150 mg Oral BID  . levothyroxine  75 mcg Oral QAC breakfast  . loratadine  10 mg Oral Daily  . LORazepam  0.5 mg Oral BID  . memantine  28 mg Oral Daily  . metronidazole  500 mg Intravenous Once  . metronidazole  500 mg Intravenous Q8H  . mirtazapine  15 mg Oral QHS  . multivitamin-lutein  1 capsule Oral Daily  . pantoprazole  40 mg Oral Daily   Continuous Infusions: . sodium chloride 50 mL/hr at 10/17/15 0607    Active Problems:   Abnormal CT scan, small bowel   IRON DEFICIENCY   DEPRESSION/ANXIETY   Gait instability   Unspecified protein-calorie malnutrition (HCC)   Small bowel edema   CKD (chronic kidney disease) stage 3, GFR 30-59 ml/min   Dementia   Leukocytosis   GERD (gastroesophageal reflux disease)    Hypothyroidism    Time spent: 35 mins    [facility] MD [facility] Pager [phone]. If 7PM-7AM, please contact night-coverage at [email or web address], password [facility] 10/17/2015, 10:26 AM  LOS: 3 days

## 2015-10-18 DIAGNOSIS — K219 Gastro-esophageal reflux disease without esophagitis: Secondary | ICD-10-CM

## 2015-10-18 DIAGNOSIS — Z8719 Personal history of other diseases of the digestive system: Secondary | ICD-10-CM | POA: Diagnosis present

## 2015-10-18 DIAGNOSIS — E46 Unspecified protein-calorie malnutrition: Secondary | ICD-10-CM

## 2015-10-18 DIAGNOSIS — K529 Noninfective gastroenteritis and colitis, unspecified: Principal | ICD-10-CM

## 2015-10-18 DIAGNOSIS — R933 Abnormal findings on diagnostic imaging of other parts of digestive tract: Secondary | ICD-10-CM

## 2015-10-18 DIAGNOSIS — Z8619 Personal history of other infectious and parasitic diseases: Secondary | ICD-10-CM | POA: Diagnosis present

## 2015-10-18 DIAGNOSIS — E039 Hypothyroidism, unspecified: Secondary | ICD-10-CM

## 2015-10-18 DIAGNOSIS — N183 Chronic kidney disease, stage 3 (moderate): Secondary | ICD-10-CM

## 2015-10-18 DIAGNOSIS — D72829 Elevated white blood cell count, unspecified: Secondary | ICD-10-CM

## 2015-10-18 DIAGNOSIS — D509 Iron deficiency anemia, unspecified: Secondary | ICD-10-CM

## 2015-10-18 DIAGNOSIS — F0390 Unspecified dementia without behavioral disturbance: Secondary | ICD-10-CM

## 2015-10-18 LAB — BASIC METABOLIC PANEL
ANION GAP: 5 (ref 5–15)
BUN: 14 mg/dL (ref 6–20)
CHLORIDE: 110 mmol/L (ref 101–111)
CO2: 25 mmol/L (ref 22–32)
Calcium: 8.3 mg/dL — ABNORMAL LOW (ref 8.9–10.3)
Creatinine, Ser: 1.35 mg/dL — ABNORMAL HIGH (ref 0.44–1.00)
GFR, EST AFRICAN AMERICAN: 40 mL/min — AB (ref >60)
GFR, EST NON AFRICAN AMERICAN: 35 mL/min — AB (ref >60)
Glucose, Bld: 97 mg/dL (ref 65–99)
POTASSIUM: 4.1 mmol/L (ref 3.5–5.1)
SODIUM: 140 mmol/L (ref 135–145)

## 2015-10-18 MED ORDER — SACCHAROMYCES BOULARDII 250 MG PO CAPS
250.0000 mg | ORAL_CAPSULE | Freq: Two times a day (BID) | ORAL | Status: DC
  Administered 2015-10-18 – 2015-10-20 (×5): 250 mg via ORAL
  Filled 2015-10-18 (×6): qty 1

## 2015-10-18 MED ORDER — FAMOTIDINE 20 MG PO TABS
20.0000 mg | ORAL_TABLET | Freq: Every day | ORAL | Status: DC
  Administered 2015-10-18 – 2015-10-20 (×3): 20 mg via ORAL
  Filled 2015-10-18 (×3): qty 1

## 2015-10-18 NOTE — Progress Notes (Signed)
[facility] Progress Note    Patient: Melanie Levy    [id number]  DOB: May 05, 1928     DOA: 10/14/2015 Date of Service: the patient was seen and examined on 10/18/2015 Day 4 of admission.  Subjective: patient complaining of some nausea this morning he had 2 episodes of regular bowel movement this morning as well. Complains of headache. Nutrition: able to tolerate oral diet with assistance Activity: mostly at bedrest at her baseline Last BM: 10/18/2015  Assessment and Plan: 1. Enteritis Patient presented with complaints of abdominal pain. CT scan shows small bowel edema consistent with possible enteritis. Patient was started on Cipro and Flagyl and clinically improving. Unable to tolerate oral diet. Continues to have mild abdominal tenderness. Continue monitoring her for 1 more day due to her nausea and poor oral intake.  2.headache. When necessary Tylenol. No focal deficit.  3. Dementia. Depression. At present does not appear to be having any acute behavioral issue. Continue home medication. Continue close monitoring.  4iron deficiency anemia. Continue supplements.  5. Hypothyroidism continuing Synthroid.  6.GERD. Discontinuing PPI starting her on Pepcid.  DVT Prophylaxis: subcutaneous Heparin Nutrition: regular diet Advance goals of care discussion: full code  Brief Summary of Hospitalization:  HPI: as per the H&P dictated on 10/14/2015 the patient presented with abdominal pain with leukocytosis without any fever or diarrhea. Patient was admitted for entritis Daily update: admitted on 10/14/2015, surgery consulted on the same day. Started on IV antibiotics. Gradually improvement Consultants: general surgery Procedures: none Antibiotics: Anti-infectives    Start     Dose/Rate Route Frequency Ordered Stop   10/17/15 2000  ciprofloxacin (CIPRO) tablet 250 mg     250 mg Oral Every 12 hours 10/17/15 1033     10/17/15 1400  metroNIDAZOLE (FLAGYL) tablet 500  mg     500 mg Oral 3 times per day 10/17/15 1033     10/14/15 1800  metroNIDAZOLE (FLAGYL) IVPB 500 mg  Status:  Discontinued     500 mg 100 mL/hr over 60 Minutes Intravenous Every 8 hours 10/14/15 1633 10/17/15 1033   10/14/15 1545  ciprofloxacin (CIPRO) IVPB 400 mg  Status:  Discontinued     400 mg 200 mL/hr over 60 Minutes Intravenous Every 12 hours 10/14/15 1541 10/17/15 1033   10/14/15 1530  ciprofloxacin (CIPRO) IVPB 400 mg  Status:  Discontinued     400 mg 200 mL/hr over 60 Minutes Intravenous Every 12 hours 10/14/15 1523 10/14/15 1541   10/14/15 1530  metroNIDAZOLE (FLAGYL) IVPB 500 mg  Status:  Discontinued     500 mg 100 mL/hr over 60 Minutes Intravenous Every 8 hours 10/14/15 1523 10/14/15 1633   10/14/15 1430  metroNIDAZOLE (FLAGYL) IVPB 500 mg  Status:  Discontinued     500 mg 100 mL/hr over 60 Minutes Intravenous  Once 10/14/15 1415 10/18/15 0828   10/14/15 1415  ciprofloxacin (CIPRO) IVPB 400 mg     400 mg 200 mL/hr over 60 Minutes Intravenous  Once 10/14/15 1415 10/14/15 1634      Family Communication: no family at bedside Disposition:  Expected discharge date:10/19/2015 Barriers to safe discharge: oral intake   Intake/Output Summary (Last 24 hours) at 10/18/15 1617 Last data filed at 10/18/15 1400  Gross per 24 hour  Intake    480 ml  Output    600 ml  Net   -120 ml   Filed Weights   10/14/15 1700 10/15/15 0500 10/16/15 0428  Weight: 54.199 kg (119 lb 7.8 oz) 54.477 kg (  120 lb 1.6 oz) 54.613 kg (120 lb 6.4 oz)    Objective: Physical Exam: Filed Vitals:   10/18/15 0540 10/18/15 0921 10/18/15 0929 10/18/15 1400  BP: 169/82   145/66  Pulse: 75   94  Temp: 99.1 F (37.3 C)  98.7 F (37.1 C) 98.6 F (37 C)  TempSrc: Oral   Oral  Resp: 16   18  Height:      Weight:      SpO2: 97% 95%  99%     General: Appear in mild distress, no Rash; Oral Mucosa moist. Cardiovascular: S1 and S2 Present, no Murmur, no JVD Respiratory: Bilateral Air entry present  and Clear to Auscultation, no Crackles, no wheezes Abdomen: Bowel Sound present, Soft and no tenderness Extremities: no Pedal edema, no calf tenderness Neurology: Grossly no focal neuro deficit.  Data Reviewed: CBC:  Recent Labs Lab 10/14/15 1222 10/15/15 0810 10/16/15 0510 10/17/15 0516  WBC 13.4* 6.8 6.2 6.0  NEUTROABS 11.9*  --   --   --   HGB 13.5 11.6* 11.0* 11.8*  HCT 42.5 37.1 35.0* 37.7  MCV 101.0* 101.6* 101.7* 101.9*  PLT 225 198 174 [id number]   Basic Metabolic Panel:  Recent Labs Lab 10/14/15 1222 10/15/15 0810 10/16/15 0510 10/17/15 0516 10/18/15 0545  NA 142 142 142 143 140  K 4.6 4.3 3.9 3.9 4.1  CL 106 110 111 111 110  CO2 27 25 25 25 25   GLUCOSE 110* 117* 90 95 97  BUN 26* 18 14 10 14   CREATININE 1.16* 1.20* 1.08* 1.28* 1.35*  CALCIUM 9.0 8.2* 7.8* 8.4* 8.3*  MG 2.2  --  1.7  --   --    Liver Function Tests:  Recent Labs Lab 10/14/15 1222 10/15/15 0810  AST 24 17  ALT 16 14  ALKPHOS 84 69  BILITOT 0.8 0.7  PROT 6.8 5.6*  ALBUMIN 4.0 3.2*    Recent Labs Lab 10/14/15 1222  LIPASE 54*    Studies: No results found.   Scheduled Meds: . antiseptic oral rinse  7 mL Mouth Rinse q12n4p  . arformoterol  15 mcg Nebulization BID   And  . budesonide (PULMICORT) nebulizer solution  0.25 mg Nebulization BID  . atorvastatin  10 mg Oral q1800  . chlorhexidine  15 mL Mouth Rinse BID  . ciprofloxacin  250 mg Oral Q12H  . enoxaparin (LOVENOX) injection  30 mg Subcutaneous Q24H  . famotidine  20 mg Oral Daily  . FLUoxetine  20 mg Oral Daily  . iron polysaccharides  150 mg Oral BID  . levothyroxine  75 mcg Oral QAC breakfast  . loratadine  10 mg Oral Daily  . LORazepam  0.5 mg Oral BID  . memantine  28 mg Oral Daily  . metroNIDAZOLE  500 mg Oral 3 times per day  . mirtazapine  15 mg Oral QHS  . multivitamin-lutein  1 capsule Oral Daily  . saccharomyces boulardii  250 mg Oral BID   Continuous Infusions:   Time spent: 30  minutes  Author: Berle Mull, MD [facility] Pager: [phone] 10/18/2015 4:17 PM  If 7PM-7AM, please contact night-coverage at [email or web address], password [facility]

## 2015-10-18 NOTE — Progress Notes (Signed)
CSW continuing to follow.   Pt admitted from [facility] and plan is to return to ALF upon discharge.  CSW spoke with RN who reports that pt not yet medically ready for discharge today.   CSW updated [facility] RN liaison, Marguarite Arbour.  CSW updated pt daughter, Eustaquio Maize via telephone.   CSW to continue to follow to provide support and assist with pt return to [facility] when medically ready.  Alison Murray, MSW, [place] [employer] [phone]

## 2015-10-19 DIAGNOSIS — N289 Disorder of kidney and ureter, unspecified: Secondary | ICD-10-CM

## 2015-10-19 LAB — BASIC METABOLIC PANEL
ANION GAP: 7 (ref 5–15)
ANION GAP: 7 (ref 5–15)
BUN: 21 mg/dL — AB (ref 6–20)
BUN: 26 mg/dL — AB (ref 6–20)
CHLORIDE: 109 mmol/L (ref 101–111)
CHLORIDE: 108 mmol/L (ref 101–111)
CO2: 24 mmol/L (ref 22–32)
CO2: 26 mmol/L (ref 22–32)
Calcium: 8.3 mg/dL — ABNORMAL LOW (ref 8.9–10.3)
Calcium: 8.7 mg/dL — ABNORMAL LOW (ref 8.9–10.3)
Creatinine, Ser: 1.38 mg/dL — ABNORMAL HIGH (ref 0.44–1.00)
Creatinine, Ser: 1.51 mg/dL — ABNORMAL HIGH (ref 0.44–1.00)
GFR calc Af Amer: 39 mL/min — ABNORMAL LOW (ref >60)
GFR calc Af Amer: 35 mL/min — ABNORMAL LOW (ref >60)
GFR calc non Af Amer: 34 mL/min — ABNORMAL LOW (ref >60)
GFR calc non Af Amer: 30 mL/min — ABNORMAL LOW (ref >60)
GLUCOSE: 83 mg/dL (ref 65–99)
Glucose, Bld: 95 mg/dL (ref 65–99)
POTASSIUM: 4.4 mmol/L (ref 3.5–5.1)
POTASSIUM: 4.5 mmol/L (ref 3.5–5.1)
SODIUM: 140 mmol/L (ref 135–145)
SODIUM: 141 mmol/L (ref 135–145)

## 2015-10-19 MED ORDER — FAMOTIDINE 20 MG PO TABS: 20.0000 mg | ORAL_TABLET | Freq: Every day | ORAL | Status: AC

## 2015-10-19 MED ORDER — CIPROFLOXACIN HCL 250 MG PO TABS: 250.0000 mg | ORAL_TABLET | Freq: Two times a day (BID) | ORAL | Status: AC

## 2015-10-19 MED ORDER — METRONIDAZOLE 500 MG PO TABS
500.0000 mg | ORAL_TABLET | Freq: Three times a day (TID) | ORAL | Status: DC
Start: 2015-10-19 — End: 2015-10-28

## 2015-10-19 MED ORDER — SACCHAROMYCES BOULARDII 250 MG PO CAPS: 250.0000 mg | ORAL_CAPSULE | Freq: Two times a day (BID) | ORAL | Status: DC

## 2015-10-19 MED ORDER — MEGESTROL ACETATE 400 MG/10ML PO SUSP
400.0000 mg | Freq: Every day | ORAL | Status: DC
  Administered 2015-10-19 – 2015-10-20 (×2): 400 mg via ORAL
  Filled 2015-10-19 (×2): qty 10

## 2015-10-19 MED ORDER — SODIUM CHLORIDE 0.9 % IV SOLN
INTRAVENOUS | Status: DC
  Administered 2015-10-19 (×2): via INTRAVENOUS

## 2015-10-19 NOTE — Progress Notes (Addendum)
[facility] Progress Note    Patient: Melanie Levy    [id number]  DOB: 1928/05/26     DOA: 10/14/2015 Date of Service: the patient was seen and examined on 10/19/2015 Day 5 of admission.  Subjective: headache has resolved, has poor oral intake due to low appetite and occasional nausea. Nutrition: able to tolerate oral diet with assistance Activity: mostly at bedrest at her baseline Last BM: 10/19/2015  Assessment and Plan: 1. Enteritis Patient presented with complaints of abdominal pain. CT scan shows small bowel edema consistent with possible enteritis. Patient was started on Cipro and Flagyl. Nausea can be the side effect of metronidazole.  2.CKD with mild acute renal insufficiency Mild renal function worsening due to poor oral intake, Will given her IV hydration for 12 hours and also start her on megestrol for appetite stimulation  3. Dementia. Depression. At present does not appear to be having any acute behavioral issue. Continue home medication. Continue close monitoring.  4 iron deficiency anemia. Continue supplements.  5. Hypothyroidism continuing Synthroid.  6.GERD. Discontinuing PPI starting her on Pepcid.  DVT Prophylaxis: subcutaneous Heparin Nutrition: regular diet Advance goals of care discussion: full code  Brief Summary of Hospitalization:  HPI: as per the H&P dictated on 10/14/2015 the patient presented with abdominal pain with leukocytosis without any fever or diarrhea. Patient was admitted for entritis Daily update: admitted on 10/14/2015, surgery consulted on the same day. Started on IV antibiotics. Gradually improvement, 10/19/2015 IV fluids for 12 hours, megace started for appetite stimulation  Consultants: general surgery Procedures: none Antibiotics: Anti-infectives    Start     Dose/Rate Route Frequency Ordered Stop   10/19/15 0000  ciprofloxacin (CIPRO) 250 MG tablet     250 mg Oral Every 12 hours 10/19/15 0734 10/21/15 2359   10/19/15 0000  metroNIDAZOLE (FLAGYL) 500 MG tablet     500 mg Oral Every 8 hours 10/19/15 0734     10/17/15 2000  ciprofloxacin (CIPRO) tablet 250 mg     250 mg Oral Every 12 hours 10/17/15 1033     10/17/15 1400  metroNIDAZOLE (FLAGYL) tablet 500 mg     500 mg Oral 3 times per day 10/17/15 1033     10/14/15 1800  metroNIDAZOLE (FLAGYL) IVPB 500 mg  Status:  Discontinued     500 mg 100 mL/hr over 60 Minutes Intravenous Every 8 hours 10/14/15 1633 10/17/15 1033   10/14/15 1545  ciprofloxacin (CIPRO) IVPB 400 mg  Status:  Discontinued     400 mg 200 mL/hr over 60 Minutes Intravenous Every 12 hours 10/14/15 1541 10/17/15 1033   10/14/15 1530  ciprofloxacin (CIPRO) IVPB 400 mg  Status:  Discontinued     400 mg 200 mL/hr over 60 Minutes Intravenous Every 12 hours 10/14/15 1523 10/14/15 1541   10/14/15 1530  metroNIDAZOLE (FLAGYL) IVPB 500 mg  Status:  Discontinued     500 mg 100 mL/hr over 60 Minutes Intravenous Every 8 hours 10/14/15 1523 10/14/15 1633   10/14/15 1430  metroNIDAZOLE (FLAGYL) IVPB 500 mg  Status:  Discontinued     500 mg 100 mL/hr over 60 Minutes Intravenous  Once 10/14/15 1415 10/18/15 0828   10/14/15 1415  ciprofloxacin (CIPRO) IVPB 400 mg     400 mg 200 mL/hr over 60 Minutes Intravenous  Once 10/14/15 1415 10/14/15 1634      Family Communication: no family at bedside Disposition:  Expected discharge date:10/19/2015 Barriers to safe discharge: oral intake   Intake/Output Summary (Last 24 hours)  at 10/19/15 1226 Last data filed at 10/19/15 1018  Gross per 24 hour  Intake    360 ml  Output      0 ml  Net    360 ml   Filed Weights   10/14/15 1700 10/15/15 0500 10/16/15 0428  Weight: 54.199 kg (119 lb 7.8 oz) 54.477 kg (120 lb 1.6 oz) 54.613 kg (120 lb 6.4 oz)    Objective: Physical Exam: Filed Vitals:   10/18/15 0929 10/18/15 1400 10/18/15 2143 10/19/15 0600  BP:  145/66 161/66 151/69  Pulse:  94 62 66  Temp: 98.7 F (37.1 C) 98.6 F (37 C) 98.6 F (37  C) 98.7 F (37.1 C)  TempSrc:  Oral Oral Oral  Resp:  18 18 16   Height:      Weight:      SpO2:  99% 96% 94%   General: Appear in mild distress, no Rash; Oral Mucosa moist. Cardiovascular: S1 and S2 Present, no Murmur, no JVD Respiratory: Bilateral Air entry present and Clear to Auscultation, no Crackles, no wheezes Abdomen: Bowel Sound present, Soft and mild diffuse tenderness Extremities: no Pedal edema, no calf tenderness  Data Reviewed: CBC:  Recent Labs Lab 10/14/15 1222 10/15/15 0810 10/16/15 0510 10/17/15 0516  WBC 13.4* 6.8 6.2 6.0  NEUTROABS 11.9*  --   --   --   HGB 13.5 11.6* 11.0* 11.8*  HCT 42.5 37.1 35.0* 37.7  MCV 101.0* 101.6* 101.7* 101.9*  PLT 225 198 174 [id number]   Basic Metabolic Panel:  Recent Labs Lab 10/14/15 1222 10/15/15 0810 10/16/15 0510 10/17/15 0516 10/18/15 0545 10/19/15 0410  NA 142 142 142 143 140 140  K 4.6 4.3 3.9 3.9 4.1 4.4  CL 106 110 111 111 110 109  CO2 27 25 25 25 25 24   GLUCOSE 110* 117* 90 95 97 83  BUN 26* 18 14 10 14  21*  CREATININE 1.16* 1.20* 1.08* 1.28* 1.35* 1.38*  CALCIUM 9.0 8.2* 7.8* 8.4* 8.3* 8.3*  MG 2.2  --  1.7  --   --   --    Liver Function Tests:  Recent Labs Lab 10/14/15 1222 10/15/15 0810  AST 24 17  ALT 16 14  ALKPHOS 84 69  BILITOT 0.8 0.7  PROT 6.8 5.6*  ALBUMIN 4.0 3.2*    Recent Labs Lab 10/14/15 1222  LIPASE 54*    Studies: No results found.   Scheduled Meds: . antiseptic oral rinse  7 mL Mouth Rinse q12n4p  . arformoterol  15 mcg Nebulization BID   And  . budesonide (PULMICORT) nebulizer solution  0.25 mg Nebulization BID  . atorvastatin  10 mg Oral q1800  . chlorhexidine  15 mL Mouth Rinse BID  . ciprofloxacin  250 mg Oral Q12H  . enoxaparin (LOVENOX) injection  30 mg Subcutaneous Q24H  . famotidine  20 mg Oral Daily  . FLUoxetine  20 mg Oral Daily  . iron polysaccharides  150 mg Oral BID  . levothyroxine  75 mcg Oral QAC breakfast  . loratadine  10 mg Oral Daily  .  LORazepam  0.5 mg Oral BID  . megestrol  400 mg Oral Daily  . memantine  28 mg Oral Daily  . metroNIDAZOLE  500 mg Oral 3 times per day  . mirtazapine  15 mg Oral QHS  . multivitamin-lutein  1 capsule Oral Daily  . saccharomyces boulardii  250 mg Oral BID   Continuous Infusions: . sodium chloride 75 mL/hr at 10/19/15 1159  Time spent: 35 minutes  Author: Berle Mull, MD [facility] Pager: [phone] 10/19/2015 12:26 PM  If 7PM-7AM, please contact night-coverage at [email or web address], password [facility]

## 2015-10-19 NOTE — Progress Notes (Signed)
CSW continuing to follow.   Pt admitted from [facility] and plan is to return to ALF upon discharge.  CSW spoke with RN who reports that pt not yet medically ready for discharge today.   CSW updated [facility] RN liaison, Marguarite Arbour.  CSW updated pt daughter, Eustaquio Maize via telephone and provided support. Pt daughter feels that pt may be fearful of eating as pt is recovering from enteritis. Pt daughter states that pt and pt daughter definitely want pt to return to ALF and do not want SNF placement.   CSW to continue to follow to provide support and assist with pt return to [facility] when medically ready.  Alison Murray, MSW, [place] Work [phone]

## 2015-10-20 DIAGNOSIS — R339 Retention of urine, unspecified: Secondary | ICD-10-CM

## 2015-10-20 LAB — BASIC METABOLIC PANEL
ANION GAP: 8 (ref 5–15)
BUN: 22 mg/dL — ABNORMAL HIGH (ref 6–20)
CO2: 23 mmol/L (ref 22–32)
Calcium: 8.2 mg/dL — ABNORMAL LOW (ref 8.9–10.3)
Chloride: 110 mmol/L (ref 101–111)
Creatinine, Ser: 1.34 mg/dL — ABNORMAL HIGH (ref 0.44–1.00)
GFR calc non Af Amer: 35 mL/min — ABNORMAL LOW (ref >60)
GFR, EST AFRICAN AMERICAN: 41 mL/min — AB (ref >60)
GLUCOSE: 85 mg/dL (ref 65–99)
POTASSIUM: 4.3 mmol/L (ref 3.5–5.1)
Sodium: 141 mmol/L (ref 135–145)

## 2015-10-20 LAB — CBC
HEMATOCRIT: 36 % (ref 36.0–46.0)
HEMOGLOBIN: 11.5 g/dL — AB (ref 12.0–15.0)
MCH: 31.5 pg (ref 26.0–34.0)
MCHC: 31.9 g/dL (ref 30.0–36.0)
MCV: 98.6 fL (ref 78.0–100.0)
Platelets: 176 10*3/uL (ref 150–400)
RBC: 3.65 MIL/uL — AB (ref 3.87–5.11)
RDW: 13.1 % (ref 11.5–15.5)
WBC: 6.4 10*3/uL (ref 4.0–10.5)

## 2015-10-20 MED ORDER — TRAMADOL HCL 50 MG PO TABS: 50.0000 mg | ORAL_TABLET | Freq: Three times a day (TID) | ORAL | Status: DC | PRN

## 2015-10-20 MED ORDER — LORAZEPAM 0.5 MG PO TABS: 0.5000 mg | ORAL_TABLET | Freq: Two times a day (BID) | ORAL | Status: DC

## 2015-10-20 MED ORDER — MEGESTROL ACETATE 40 MG/ML PO SUSP: 400.0000 mg | Freq: Every day | ORAL | Status: DC

## 2015-10-20 MED ORDER — TAMSULOSIN HCL 0.4 MG PO CAPS: 0.4000 mg | ORAL_CAPSULE | Freq: Every day | ORAL | Status: AC

## 2015-10-20 NOTE — Discharge Summary (Addendum)
[facility] Discharge Summary   Patient: Melanie Levy    [id number] PCP: Donnajean Lopes, MD    DOB: 03-01-1928 Date of admission: 10/14/2015  Date of discharge: 10/20/2015   Discharge Diagnoses:  Principal Problem:   Enteritis Active Problems:   IRON DEFICIENCY   DEPRESSION/ANXIETY   Gait instability   Unspecified protein-calorie malnutrition (HCC)   Small bowel edema   Abnormal CT scan, small bowel   CKD (chronic kidney disease) stage 3, GFR 30-59 ml/min   Dementia   Leukocytosis   GERD (gastroesophageal reflux disease)   Hypothyroidism   Renal insufficiency   Recommendations for Outpatient Follow-up:  1. Follow-up with PCP in one week 2. Follow-up with  urology in 1-2 week  Diet recommendation: Soft diet regular  Activity: The patient is advised to gradually reintroduce usual activities.  Discharge Condition: good  History of present illness: As per the H and P dictated on admission, "Melanie Levy is a 81 y.o. female  With history of diverticulosis, esophageal stricture, iron deficiency anemia, history of cholecystectomy, appendectomy, abdominal hysterectomy, dementia, depression, anxiety, diverticulosis, prior history of partial small bowel obstruction which resolved nonoperatively, gastroesophageal reflux disease presented to the ED with sudden onset of diffuse abdominal pain. Patient describes the pain as a constant pain that woke her up on the morning of admission. Patient denies any fevers, no chills, no diarrhea, no constipation, no dysuria, no weakness, no melena, no hematemesis, no hematochezia. Patient denies any chest pain, no shortness of breath. Patient was seen in the emergency room, comprehensive metabolic profile obtained and a BUN of 26 creatinine 1.16 glucose of 110 otherwise was within normal limits. Lipase levels of T4. CBC had a white count of 13.4 hemoglobin of 13.5, white count of 13.4, platelets of 225. Urinalysis was nitrite negative,  leukocytes negative. Patient was given a dose of IV ciprofloxacin and Flagyl. [facility] were called to admit the patient for further evaluation and management."  Hospital Course:  Summary of her active problems in the hospital is as following. 1. Enteritis Patient presented with complaints of abdominal pain. CT scan shows Abnormal long segment mid small bowel wall thickening with surrounding mesenteric edema  consistent with enteritis. Patient was started on Cipro and Flagyl. Patient will finish a seven-day course General surgery was consulted and the patient was treated conservatively. Patient tolerates soft diet well, and has no episode of vomiting here in hospital. Patient had regular 1 bowel movement a day. She did have some nausea which could be side effect of the Flagyl. She also had decreased appetite which might be secondary to the fear of recurrence of her symptoms. Megace was started and her appetite improved significantly.  2.CKD with mild acute renal insufficiency Bladder retention.  Mild renal function worsening due to poor oral intake. Patient was incontinent in the hospital and bladder scan showed 400 mL of urine in the bladder after an episode of urination. Most likely due to medication side effect from her antidepressants. Urology was consulted and the Foley catheter was inserted. Patient will need outpatient urology follow-up. Start on Flomax.  3. Dementia. Depression. At present does not appear to be having any acute behavioral issue. Continue home medication.  4 iron deficiency anemia. Continue supplements.  5. Hypothyroidism continuing Synthroid.  6.GERD. Discontinuing PPI starting her on Pepcid.  New prescription for lorazepam as well as tramadol were requested by the assisted living facility and they were ordered.  All other chronic medical condition were stable during the hospitalization.  Patient was seen by physical therapy,due to her chronic  bedridden condition at the assisted living facility the patient was recommended to be discharged back to assisted living facility; which was arranged by Education officer, museum and case Freight forwarder. On the day of the discharge the patient's oral intake was adequate and a Foley catheter was placed, and no other acute medical condition were reported by patient. the patient was felt safe to be discharge at assisted living facility.  Procedures and Results:  none   Consultations:  Urology   Gen. surgery  Discharge Exam: Filed Weights   10/14/15 1700 10/15/15 0500 10/16/15 0428  Weight: 54.199 kg (119 lb 7.8 oz) 54.477 kg (120 lb 1.6 oz) 54.613 kg (120 lb 6.4 oz)   Filed Vitals:   10/20/15 0200 10/20/15 0600  BP: 138/57 130/74  Pulse: 70 82  Temp: 98.3 F (36.8 C) 98.2 F (36.8 C)  Resp: 16 16    General: Appear in no distress, no Rash; Oral Mucosa moist. Cardiovascular: S1 and S2 Present, no Murmur, no JVD Respiratory: Bilateral Air entry present and Clear to Auscultation, no Crackles, nowheezes Abdomen: Bowel Sound present, Soft and no tenderness Extremities: no Pedal edema, no calf tenderness Neurology: Grossly no focal neuro deficit.  DISCHARGE MEDICATION:  Current Discharge Medication List    START taking these medications   Details  ciprofloxacin (CIPRO) 250 MG tablet Take 1 tablet (250 mg total) by mouth every 12 (twelve) hours. Qty: 6 tablet, Refills: 0    famotidine (PEPCID) 20 MG tablet Take 1 tablet (20 mg total) by mouth daily. Qty: 30 tablet, Refills: 0    megestrol (MEGACE) 40 MG/ML suspension Take 10 mLs (400 mg total) by mouth daily. Qty: 240 mL, Refills: 0    metroNIDAZOLE (FLAGYL) 500 MG tablet Take 1 tablet (500 mg total) by mouth every 8 (eight) hours. Qty: 9 tablet, Refills: 0    saccharomyces boulardii (FLORASTOR) 250 MG capsule Take 1 capsule (250 mg total) by mouth 2 (two) times daily. Qty: 14 capsule, Refills: 0    tamsulosin (FLOMAX) 0.4 MG CAPS capsule  Take 1 capsule (0.4 mg total) by mouth daily after supper. Qty: 30 capsule, Refills: 0      CONTINUE these medications which have NOT CHANGED   Details  acetaminophen (TYLENOL) 325 MG tablet Take 650 mg by mouth every 6 (six) hours as needed for moderate pain or fever.    atorvastatin (LIPITOR) 10 MG tablet Take 10 mg by mouth daily at 6 PM.     clotrimazole (LOTRIMIN) 1 % cream Apply 1 application topically 2 (two) times daily as needed (rash).    FLUoxetine (PROZAC) 20 MG capsule Take 20 mg by mouth daily.    Fluticasone Furoate-Vilanterol 100-25 MCG/INH AEPB Inhale 1 puff into the lungs daily.    guaifenesin (ROBITUSSIN) 100 MG/5ML syrup Take 100 mg by mouth 4 (four) times daily as needed for cough.    iron polysaccharides (NIFEREX) 150 MG capsule Take 150 mg by mouth 2 (two) times daily.      levothyroxine (SYNTHROID, LEVOTHROID) 75 MCG tablet Take 75 mcg by mouth daily before breakfast.    loratadine (CLARITIN) 10 MG tablet Take 10 mg by mouth daily.    LORazepam (ATIVAN) 0.5 MG tablet Take 0.5 mg by mouth 2 (two) times daily. May take another 1 tablet as needed for anxiety    Memantine HCl ER (NAMENDA XR) 28 MG CP24 Take 28 mg by mouth daily.    mirtazapine (REMERON) 15 MG  tablet Take 15 mg by mouth at bedtime.      Multiple Vitamins-Minerals (PRESERVISION AREDS 2 PO) Take 1 capsule by mouth 2 (two) times daily.     PRESCRIPTION MEDICATION Apply 1 application topically as needed (for itching to skin). TRIAMCINOLONE 0.1% AND HYDROCERIN 2:1 CREAM    traMADol (ULTRAM) 50 MG tablet Take 1 tablet (50 mg total) by mouth every 6 (six) hours as needed (for pain). Qty: 20 tablet, Refills: 0    Triamcinolone Acetonide (TRIAMCINOLONE 0.1 % CREAM : EUCERIN) CREA Apply 1 application topically 3 (three) times daily as needed for itching.    Vitamin D, Ergocalciferol, (DRISDOL) 50000 UNITS CAPS capsule Take 50,000 Units by mouth every 7 (seven) days.      STOP taking these  medications     hydrOXYzine (VISTARIL) 25 MG capsule      omeprazole (PRILOSEC) 20 MG capsule        Allergies  Allergen Reactions  . Iohexol      Desc: Pt states that years ago she had a procedure that involved IV contrast and had throat swelling and sob. She was told never to have IV contrast again.  Pt is a very poor historian and cannot remember the date or the procedure.   . Milk-Related Compounds Nausea And Vomiting  . Penicillins Swelling    Tongue swelling   Follow-up Information    Follow up with Donnajean Lopes, MD In 1 week.   Specialty:  Internal Medicine   Why:  with BMP   Contact information:   [place] [address] [phone]       Follow up with [facility] In 1 week.   Contact information:   [place] [phone]      The results of significant diagnostics from this hospitalization (including imaging, microbiology, ancillary and laboratory) are listed below for reference.    Significant Diagnostic Studies: Ct Abdomen Pelvis Wo Contrast  10/14/2015  CLINICAL DATA:  Abdominal pain with soft tissue abdominal mass. History of dementia and anemia. EXAM: CT ABDOMEN AND PELVIS WITHOUT CONTRAST TECHNIQUE: Multidetector CT imaging of the abdomen and pelvis was performed following the standard protocol without IV contrast. COMPARISON:  Abdominal CT 07/29/2003.  Pelvic CT 10/13/2013. FINDINGS: Lower chest: The lung bases are clear. There is no significant pleural or pericardial effusion. There is a large hiatal hernia which has enlarged compared with the prior study. 3 cm low-density lesion along the right hemidiaphragm is unchanged, likely an incidental diaphragmatic or bronchogenic cyst. Hepatobiliary: There is a 2.8 cm cyst in the left hepatic lobe on image number 18 which has enlarged. No suspicious hepatic findings. Mild extrahepatic biliary prominence status post cholecystectomy, within  physiologic limits. Pancreas: Atrophied without focal abnormality or surrounding inflammation. Spleen: Normal in size without focal abnormality. Adrenals/Urinary Tract: Both adrenal glands appear normal. Both kidneys appear normal without evidence of hydronephrosis, focal mass lesion or perinephric soft tissue stranding. No evidence of urinary tract calculus. The bladder is mildly distended without apparent focal abnormality. Stomach/Bowel: As above, large hiatal hernia. There is a long segment of abnormal circumferential small bowel wall thickening in the mid and right abdomen. The distal small bowel appears normal. There is no high-grade bowel obstruction. There is edema throughout the mesenteric fat. Mild diverticular changes are present within the distal colon. No definite extraluminal air or fluid collections are identified, although there is an atypical collection of gas in the mid abdomen (image  42) which appears to be within the small bowel lumen based on the reformatted images. Vascular/Lymphatic: There are no enlarged abdominal or pelvic lymph nodes. Mild atherosclerosis of the aorta, its branches and the iliac arteries. Vascular assessment limited without contrast. Reproductive: Unremarkable. Other: There is a midline supraumbilical hernia containing only fat, measuring up to 6.1 cm in diameter. No herniated bowel or soft tissue mass identified. Musculoskeletal: No acute or significant osseous findings. There are posttraumatic deformities of the right pubic rami. There are degenerative changes throughout the lumbar spine associate with a convex right scoliosis and grade 1 anterolisthesis at L3-4. Superior endplate compression deformity at T10 does not appear acute. IMPRESSION: 1. Abnormal long segment mid small bowel wall thickening with surrounding mesenteric edema suspicious for ischemia or bowel hemorrhage. No high-grade obstruction or signs of perforation. 2. Patient's palpable concern may correspond  with a supraumbilical hernia containing only fat. There is no herniated bowel. 3. Large hiatal hernia. 4. These results were called by telephone at the time of interpretation on 10/14/2015 at 2:03 pm to Dr. Shirlyn Goltz , who verbally acknowledged these results. Electronically Signed   By: Richardean Sale M.D.   On: 10/14/2015 14:05   Dg Abd Acute W/chest  10/16/2015  CLINICAL DATA:  Right lower quadrant pain.  Nausea EXAM: DG ABDOMEN ACUTE W/ 1V CHEST COMPARISON:  08/24/2013 chest x-ray FINDINGS: Previously administered oral contrast has reached the nondilated colon, showing multiple colonic diverticula. There is no indication of small bowel obstruction. No evidence of pneumatosis or perforation. No cardiomegaly. Lower mediastinal widening from large hiatal hernia. There is no edema, consolidation, effusion, or pneumothorax. Remote right obturator ring fracture with pelvic asymmetry from displacement. IMPRESSION: 1. Nonobstructive bowel gas pattern. 2. No evidence of acute cardiopulmonary disease. Electronically Signed   By: Monte Fantasia M.D.   On: 10/16/2015 11:54    Microbiology: No results found for this or any previous visit (from the past 240 hour(s)).   Labs: CBC:  Recent Labs Lab 10/14/15 1222 10/15/15 0810 10/16/15 0510 10/17/15 0516 10/20/15 0412  WBC 13.4* 6.8 6.2 6.0 6.4  NEUTROABS 11.9*  --   --   --   --   HGB 13.5 11.6* 11.0* 11.8* 11.5*  HCT 42.5 37.1 35.0* 37.7 36.0  MCV 101.0* 101.6* 101.7* 101.9* 98.6  PLT 225 198 174 183 0000000   Basic Metabolic Panel:  Recent Labs Lab 10/14/15 1222  10/16/15 0510 10/17/15 0516 10/18/15 0545 10/19/15 0410 10/19/15 1615 10/20/15 0412  NA 142  < > 142 143 140 140 141 141  K 4.6  < > 3.9 3.9 4.1 4.4 4.5 4.3  CL 106  < > 111 111 110 109 108 110  CO2 27  < > 25 25 25 24 26 23   GLUCOSE 110*  < > 90 95 97 83 95 85  BUN 26*  < > 14 10 14  21* 26* 22*  CREATININE 1.16*  < > 1.08* 1.28* 1.35* 1.38* 1.51* 1.34*  CALCIUM 9.0  < > 7.8*  8.4* 8.3* 8.3* 8.7* 8.2*  MG 2.2  --  1.7  --   --   --   --   --   < > = values in this interval not displayed. Liver Function Tests:  Recent Labs Lab 10/14/15 1222 10/15/15 0810  AST 24 17  ALT 16 14  ALKPHOS 84 69  BILITOT 0.8 0.7  PROT 6.8 5.6*  ALBUMIN 4.0 3.2*    Recent Labs Lab 10/14/15 1222  LIPASE 54*   Time spent: 30 minutes  Signed:  PATEL, PRANAV  [facility] 10/20/2015, 2:06 PM

## 2015-10-20 NOTE — Progress Notes (Signed)
Unable to place foley cath at this time.  2 additional RNs attempted to place unable to place.  Dr Posey Pronto paged with this information

## 2015-10-20 NOTE — NC FL2 (Signed)
[place] LEVEL OF CARE SCREENING TOOL     IDENTIFICATION  Patient Name: Melanie Levy Birthdate: 14-Jan-1928 Sex: female Admission Date (Current Location): 10/14/2015  [facility] and [place] Number: Herbalist and Address:  [facility],  [place] [address], [place]      Provider Number: [id number]  Attending Physician Name and Address:  Lavina Hamman, MD  Relative Name and Phone Number:       Current Level of Care: Hospital Recommended Level of Care: [place] Prior Approval Number:    Date Approved/Denied:   PASRR Number:    Discharge Plan: Other (Comment) (ALF)    Current Diagnoses: Patient Active Problem List   Diagnosis Date Noted  . Urinary retention 10/20/2015  . Renal insufficiency 10/19/2015  . Enteritis 10/18/2015  . Small bowel edema 10/14/2015  . Abnormal CT scan, small bowel 10/14/2015  . CKD (chronic kidney disease) stage 3, GFR 30-59 ml/min 10/14/2015  . Dementia 10/14/2015  . Leukocytosis 10/14/2015  . GERD (gastroesophageal reflux disease) 10/14/2015  . Hypothyroidism 10/14/2015  . Hyperlipidemia   . Gait instability 08/27/2013    Class: Acute  . Unspecified protein-calorie malnutrition ([facility]) 08/27/2013    Class: Chronic  . Hypokalemia 08/27/2013    Class: Acute  . Multiple skin tears 08/27/2013    Class: Acute  . IRON DEFICIENCY 03/12/2008  . ANEMIA 03/12/2008  . DEPRESSION/ANXIETY 03/12/2008  . ASTHMA 03/12/2008  . ESOPHAGEAL STRICTURE 03/12/2008  . HIATAL HERNIA 03/12/2008  . DIVERTICULOSIS, COLON 03/12/2008  . ARTHRITIS 03/12/2008    Orientation RESPIRATION BLADDER Height & Weight    Self, Situation, Place  Normal Indwelling catheter (empty each shift or PRN if foley bag full ) 5\' 4"  (162.6 cm) 120 lbs.  BEHAVIORAL SYMPTOMS/MOOD NEUROLOGICAL BOWEL NUTRITION STATUS     (NONE) Continent Diet (see DC summary-soft regular diet)  AMBULATORY STATUS COMMUNICATION OF NEEDS Skin    Extensive Assist Verbally Normal                       Personal Care Assistance Level of Assistance  Bathing, Feeding, Dressing Bathing Assistance: Maximum assistance Feeding assistance: Limited assistance Dressing Assistance: Maximum assistance     Functional Limitations Info  Sight, Hearing, Speech Sight Info: Impaired Hearing Info: Adequate Speech Info: Adequate    SPECIAL CARE FACTORS FREQUENCY                       Contractures      Additional Factors Info  Code Status, Allergies, Psychotropic Code Status Info: FULL code status Allergies Info: Iohexol, Milk-related Compounds, Penicillins Psychotropic Info: Prozac, Ativan, Remeron         Current Medications (10/20/2015):  This is the current hospital active medication list Current Facility-Administered Medications  Medication Dose Route Frequency Provider Last Rate Last Dose  . acetaminophen (TYLENOL) tablet 650 mg  650 mg Oral Q6H PRN Eugenie Filler, MD   650 mg at 10/18/15 1746   Or  . acetaminophen (TYLENOL) suppository 650 mg  650 mg Rectal Q6H PRN Eugenie Filler, MD      . antiseptic oral rinse (CPC / CETYLPYRIDINIUM CHLORIDE 0.05%) solution 7 mL  7 mL Mouth Rinse q12n4p Eugenie Filler, MD   7 mL at 10/19/15 1647  . arformoterol (BROVANA) nebulizer solution 15 mcg  15 mcg Nebulization BID Eugenie Filler, MD   15 mcg at 10/20/15 0931   And  .  budesonide (PULMICORT) nebulizer solution 0.25 mg  0.25 mg Nebulization BID Eugenie Filler, MD   0.25 mg at 10/20/15 0931  . atorvastatin (LIPITOR) tablet 10 mg  10 mg Oral q1800 Eugenie Filler, MD   10 mg at 10/19/15 1713  . chlorhexidine (PERIDEX) 0.12 % solution 15 mL  15 mL Mouth Rinse BID Eugenie Filler, MD   15 mL at 10/20/15 1033  . ciprofloxacin (CIPRO) tablet 250 mg  250 mg Oral Q12H Eugenie Filler, MD   250 mg at 10/20/15 1033  . clotrimazole (LOTRIMIN) 1 % cream 1 application  1 application Topical BID PRN Irine Seal V, MD       . enoxaparin (LOVENOX) injection 30 mg  30 mg Subcutaneous Q24H Eugenie Filler, MD   30 mg at 10/19/15 2115  . famotidine (PEPCID) tablet 20 mg  20 mg Oral Daily Lavina Hamman, MD   20 mg at 10/20/15 1033  . FLUoxetine (PROZAC) capsule 20 mg  20 mg Oral Daily Eugenie Filler, MD   20 mg at 10/20/15 1033  . guaifenesin (ROBITUSSIN) 100 MG/5ML syrup 100 mg  100 mg Oral QID PRN Eugenie Filler, MD      . hydrOXYzine (VISTARIL) capsule 25 mg  25 mg Oral Q8H PRN Eugenie Filler, MD      . iron polysaccharides (NIFEREX) capsule 150 mg  150 mg Oral BID Eugenie Filler, MD   150 mg at 10/20/15 1033  . levothyroxine (SYNTHROID, LEVOTHROID) tablet 75 mcg  75 mcg Oral QAC breakfast Eugenie Filler, MD   75 mcg at 10/20/15 0737  . loratadine (CLARITIN) tablet 10 mg  10 mg Oral Daily Eugenie Filler, MD   10 mg at 10/20/15 1033  . LORazepam (ATIVAN) tablet 0.5 mg  0.5 mg Oral BID Eugenie Filler, MD   0.5 mg at 10/20/15 1405  . megestrol (MEGACE) 400 MG/10ML suspension 400 mg  400 mg Oral Daily Lavina Hamman, MD   400 mg at 10/20/15 1033  . memantine (NAMENDA XR) 24 hr capsule 28 mg  28 mg Oral Daily Eugenie Filler, MD   28 mg at 10/20/15 1033  . metroNIDAZOLE (FLAGYL) tablet 500 mg  500 mg Oral 3 times per day Eugenie Filler, MD   500 mg at 10/20/15 1405  . mirtazapine (REMERON) tablet 15 mg  15 mg Oral QHS Eugenie Filler, MD   15 mg at 10/19/15 2115  . multivitamin-lutein (OCUVITE-LUTEIN) capsule 1 capsule  1 capsule Oral Daily Eugenie Filler, MD   1 capsule at 10/20/15 1033  . ondansetron (ZOFRAN) tablet 4 mg  4 mg Oral Q6H PRN Eugenie Filler, MD   4 mg at 10/18/15 1025   Or  . ondansetron [facility]) injection 4 mg  4 mg Intravenous Q6H PRN Eugenie Filler, MD   4 mg at 10/19/15 1158  . oxyCODONE (Oxy IR/ROXICODONE) immediate release tablet 5 mg  5 mg Oral Q4H PRN Eugenie Filler, MD      . saccharomyces boulardii (FLORASTOR) capsule 250 mg  250 mg Oral BID Lavina Hamman, MD   250 mg at 10/20/15 1033  . senna-docusate (Senokot-S) tablet 1 tablet  1 tablet Oral QHS PRN Eugenie Filler, MD      . sorbitol 70 % solution 30 mL  30 mL Oral Daily PRN Eugenie Filler, MD      . traMADol Veatrice Bourbon) tablet 50 mg  50 mg Oral Q8H PRN Eugenie Filler, MD   50 mg at 10/16/15 2227  . triamcinolone 0.1 % cream : eucerin cream, 1:1 1 application  1 application Topical TID PRN Eugenie Filler, MD         Discharge Medications: Please see discharge summary for a list of discharge medications.  Relevant Imaging Results:  Relevant Lab Results:   Additional Information SS#: [id number]. Refer to discharge summary for patient's discharge medication list.  Pt discharging with indwelling foley catheter upon discharge empty during each shift or PRN if foley bag is full. Home Health RN/PT/OT eval and treat at ALF.  Arrianna Catala A, LCSW

## 2015-10-20 NOTE — Progress Notes (Signed)
Report called to [facility]. Questions answered

## 2015-10-20 NOTE — Progress Notes (Signed)
Pt for discharge to [facility].   CSW facilitated pt discharge needs including contacting facility, faxing pt discharge information to facility, confirming facility received and reviewed information, providing RN phone number to call report, discussing with pt at bedside and pt daughter via telephone, and arranging ambulance transport for pt to return to [facility].   No further social work needs identified at this time.  CSW signing off.   Alison Murray, MSW, [place] Work [phone]

## 2015-10-21 NOTE — Consult Note (Signed)
Urology Consult  Referring physician: Dr. Posey Pronto Reason for referral: Difficult foley placement  Chief Complaint: suprapubic pain  History of Present Illness: Melanie Levy is an 81yo admitted for enteritis and during her hospital stay show was found to have incomplete bladder emptying and an elevated creatinine. She had PVRs>400cc. Prior to admission she had urgency, frequency, nocturia 1-2x, urge incontinence. She doe not recall needing a foley catheter before this hospitalization. Multiple attempts were made by the nursing staff to insert a foley catehter which were unsuccessful  Past Medical History  Diagnosis Date  . Diverticulosis   . Esophageal stricture   . Hiatal hernia   . Iron deficiency anemia   . Depression   . Anxiety   . Arthritis   . Asthma   . Gastric ulcer 1994  . Partial small bowel obstruction ([place]) 1995  . IBS (irritable bowel syndrome)   . GERD (gastroesophageal reflux disease)   . Osteoporosis   . Insomnia   . Macular degeneration   . Seborrheic keratosis   . Stroke ([place])   . Dementia   . CKD (chronic kidney disease) stage 3, GFR 30-59 ml/min 10/14/2015  . Hypothyroidism   . Hyperlipidemia    Past Surgical History  Procedure Laterality Date  . Cholecystectomy    . Appendectomy    . Tonsillectomy    . Spine surgery    . Bunionectomy    . Sinus surgery with instatrak    . Abdominal hysterectomy      Medications: I have reviewed the patient's current medications. Allergies:  Allergies  Allergen Reactions  . Iohexol      Desc: Pt states that years ago she had a procedure that involved IV contrast and had throat swelling and sob. She was told never to have IV contrast again.  Pt is a very poor historian and cannot remember the date or the procedure.   . Milk-Related Compounds Nausea And Vomiting  . Penicillins Swelling    Tongue swelling    Family History  Problem Relation Age of Onset  . Ovarian cancer Mother   . Heart disease    . Macular  degeneration    . Colon cancer Neg Hx    Social History:  reports that she has never smoked. She has never used smokeless tobacco. She reports that she drinks alcohol. She reports that she does not use illicit drugs.  Review of Systems  Gastrointestinal: Positive for abdominal pain.  Genitourinary: Positive for urgency and frequency.  All other systems reviewed and are negative.   Physical Exam:  Vital signs in last 24 hours: Temp:  [98.3 F (36.8 C)] 98.3 F (36.8 C) (12/09 1530) Pulse Rate:  [76] 76 (12/09 1530) Resp:  [16] 16 (12/09 1530) BP: (134)/(72) 134/72 mmHg (12/09 1530) SpO2:  [94 %-96 %] 96 % (12/09 1530) Physical Exam  Constitutional: She is oriented to person, place, and time. She appears well-developed and well-nourished.  HENT:  Head: Normocephalic and atraumatic.  Eyes: EOM are normal. Pupils are equal, round, and reactive to light.  Neck: Normal range of motion. No thyromegaly present.  Cardiovascular: Normal rate and regular rhythm.   Respiratory: Effort normal. No respiratory distress.  GI: Soft. She exhibits no distension and no mass. There is no tenderness. There is no guarding.  Genitourinary: There is tenderness on the right labia. There is tenderness on the left labia. There is tenderness in the vagina.  Severe labial edema. Severe introital stenosis   Musculoskeletal: Normal range of  motion.  Neurological: She is alert and oriented to person, place, and time.  Skin: Skin is warm and dry.  Psychiatric: She has a normal mood and affect. Her behavior is normal. Judgment and thought content normal.    Laboratory Data:  Results for orders placed or performed during the hospital encounter of 10/14/15 (from the past 72 hour(s))  Basic metabolic panel     Status: Abnormal   Collection Time: 10/19/15  4:10 AM  Result Value Ref Range   Sodium 140 135 - 145 mmol/L   Potassium 4.4 3.5 - 5.1 mmol/L   Chloride 109 101 - 111 mmol/L   CO2 24 22 - 32 mmol/L    Glucose, Bld 83 65 - 99 mg/dL   BUN 21 (H) 6 - 20 mg/dL   Creatinine, Ser 1.38 (H) 0.44 - 1.00 mg/dL   Calcium 8.3 (L) 8.9 - 10.3 mg/dL   GFR calc non Af Amer 34 (L) >60 mL/min   GFR calc Af Amer 39 (L) >60 mL/min    Comment: (NOTE) The eGFR has been calculated using the CKD EPI equation. This calculation has not been validated in all clinical situations. eGFR's persistently <60 mL/min signify possible Chronic Kidney Disease.    Anion gap 7 5 - 15  Basic metabolic panel     Status: Abnormal   Collection Time: 10/19/15  4:15 PM  Result Value Ref Range   Sodium 141 135 - 145 mmol/L   Potassium 4.5 3.5 - 5.1 mmol/L   Chloride 108 101 - 111 mmol/L   CO2 26 22 - 32 mmol/L   Glucose, Bld 95 65 - 99 mg/dL   BUN 26 (H) 6 - 20 mg/dL   Creatinine, Ser 1.51 (H) 0.44 - 1.00 mg/dL   Calcium 8.7 (L) 8.9 - 10.3 mg/dL   GFR calc non Af Amer 30 (L) >60 mL/min   GFR calc Af Amer 35 (L) >60 mL/min    Comment: (NOTE) The eGFR has been calculated using the CKD EPI equation. This calculation has not been validated in all clinical situations. eGFR's persistently <60 mL/min signify possible Chronic Kidney Disease.    Anion gap 7 5 - 15  Basic metabolic panel     Status: Abnormal   Collection Time: 10/20/15  4:12 AM  Result Value Ref Range   Sodium 141 135 - 145 mmol/L   Potassium 4.3 3.5 - 5.1 mmol/L   Chloride 110 101 - 111 mmol/L   CO2 23 22 - 32 mmol/L   Glucose, Bld 85 65 - 99 mg/dL   BUN 22 (H) 6 - 20 mg/dL   Creatinine, Ser 1.34 (H) 0.44 - 1.00 mg/dL   Calcium 8.2 (L) 8.9 - 10.3 mg/dL   GFR calc non Af Amer 35 (L) >60 mL/min   GFR calc Af Amer 41 (L) >60 mL/min    Comment: (NOTE) The eGFR has been calculated using the CKD EPI equation. This calculation has not been validated in all clinical situations. eGFR's persistently <60 mL/min signify possible Chronic Kidney Disease.    Anion gap 8 5 - 15  CBC     Status: Abnormal   Collection Time: 10/20/15  4:12 AM  Result Value Ref Range    WBC 6.4 4.0 - 10.5 K/uL   RBC 3.65 (L) 3.87 - 5.11 MIL/uL   Hemoglobin 11.5 (L) 12.0 - 15.0 g/dL   HCT 36.0 36.0 - 46.0 %   MCV 98.6 78.0 - 100.0 fL   MCH 31.5 26.0 -  34.0 pg   MCHC 31.9 30.0 - 36.0 g/dL   RDW 13.1 11.5 - 15.5 %   Platelets 176 150 - 400 K/uL   No results found for this or any previous visit (from the past 240 hour(s)). Creatinine:  Recent Labs  10/15/15 0810 10/16/15 0510 10/17/15 0516 10/18/15 0545 10/19/15 0410 10/19/15 1615 10/20/15 0412  CREATININE 1.20* 1.08* 1.28* 1.35* 1.38* 1.51* 1.34*   Baseline Creatinine: 1  Impression/Assessment:  81yo with nocturia, urgency, urge incontinence and urinary retention  Plan:  1. 16 French foley placed without incident 2. Pt to continue foley for 1 week and followup with urology for a voiding trial  Kaegan Hettich L 10/21/2015, 9:05 AM

## 2015-10-23 ENCOUNTER — Emergency Department (HOSPITAL_COMMUNITY)
Admission: EM | Admit: 2015-10-23 | Discharge: 2015-10-23 | Disposition: A | Discharge status: Home / Self Care | Payer: Medicare Other | Attending: Emergency Medicine | Admitting: Emergency Medicine

## 2015-10-23 ENCOUNTER — Encounter (HOSPITAL_COMMUNITY): Payer: Self-pay | Admitting: Oncology

## 2015-10-23 DIAGNOSIS — E039 Hypothyroidism, unspecified: Secondary | ICD-10-CM | POA: Insufficient documentation

## 2015-10-23 DIAGNOSIS — G4700 Insomnia, unspecified: Secondary | ICD-10-CM | POA: Insufficient documentation

## 2015-10-23 DIAGNOSIS — F419 Anxiety disorder, unspecified: Secondary | ICD-10-CM | POA: Diagnosis not present

## 2015-10-23 DIAGNOSIS — D509 Iron deficiency anemia, unspecified: Secondary | ICD-10-CM | POA: Diagnosis not present

## 2015-10-23 DIAGNOSIS — Y9389 Activity, other specified: Secondary | ICD-10-CM | POA: Insufficient documentation

## 2015-10-23 DIAGNOSIS — Z8719 Personal history of other diseases of the digestive system: Secondary | ICD-10-CM | POA: Insufficient documentation

## 2015-10-23 DIAGNOSIS — Z8673 Personal history of transient ischemic attack (TIA), and cerebral infarction without residual deficits: Secondary | ICD-10-CM | POA: Insufficient documentation

## 2015-10-23 DIAGNOSIS — Y998 Other external cause status: Secondary | ICD-10-CM | POA: Insufficient documentation

## 2015-10-23 DIAGNOSIS — F329 Major depressive disorder, single episode, unspecified: Secondary | ICD-10-CM | POA: Insufficient documentation

## 2015-10-23 DIAGNOSIS — N183 Chronic kidney disease, stage 3 (moderate): Secondary | ICD-10-CM | POA: Diagnosis not present

## 2015-10-23 DIAGNOSIS — Z79899 Other long term (current) drug therapy: Secondary | ICD-10-CM | POA: Insufficient documentation

## 2015-10-23 DIAGNOSIS — E870 Hyperosmolality and hypernatremia: Secondary | ICD-10-CM

## 2015-10-23 DIAGNOSIS — M810 Age-related osteoporosis without current pathological fracture: Secondary | ICD-10-CM | POA: Insufficient documentation

## 2015-10-23 DIAGNOSIS — W06XXXA Fall from bed, initial encounter: Secondary | ICD-10-CM | POA: Insufficient documentation

## 2015-10-23 DIAGNOSIS — Y92122 Bedroom in nursing home as the place of occurrence of the external cause: Secondary | ICD-10-CM | POA: Insufficient documentation

## 2015-10-23 DIAGNOSIS — Z8739 Personal history of other diseases of the musculoskeletal system and connective tissue: Secondary | ICD-10-CM | POA: Insufficient documentation

## 2015-10-23 DIAGNOSIS — Z872 Personal history of diseases of the skin and subcutaneous tissue: Secondary | ICD-10-CM | POA: Diagnosis not present

## 2015-10-23 DIAGNOSIS — E785 Hyperlipidemia, unspecified: Secondary | ICD-10-CM | POA: Insufficient documentation

## 2015-10-23 DIAGNOSIS — R531 Weakness: Secondary | ICD-10-CM | POA: Diagnosis not present

## 2015-10-23 DIAGNOSIS — Z880 Allergy status to penicillin: Secondary | ICD-10-CM | POA: Diagnosis not present

## 2015-10-23 DIAGNOSIS — Z7951 Long term (current) use of inhaled steroids: Secondary | ICD-10-CM | POA: Insufficient documentation

## 2015-10-23 DIAGNOSIS — F0390 Unspecified dementia without behavioral disturbance: Secondary | ICD-10-CM | POA: Diagnosis not present

## 2015-10-23 DIAGNOSIS — Z466 Encounter for fitting and adjustment of urinary device: Secondary | ICD-10-CM

## 2015-10-23 DIAGNOSIS — J45909 Unspecified asthma, uncomplicated: Secondary | ICD-10-CM | POA: Insufficient documentation

## 2015-10-23 DIAGNOSIS — Z043 Encounter for examination and observation following other accident: Secondary | ICD-10-CM | POA: Diagnosis present

## 2015-10-23 LAB — CBC WITH DIFFERENTIAL/PLATELET
BASOS PCT: 0 %
Basophils Absolute: 0 10*3/uL (ref 0.0–0.1)
Eosinophils Absolute: 0.3 10*3/uL (ref 0.0–0.7)
Eosinophils Relative: 4 %
HEMATOCRIT: 37.5 % (ref 36.0–46.0)
Hemoglobin: 12.1 g/dL (ref 12.0–15.0)
LYMPHS ABS: 1.9 10*3/uL (ref 0.7–4.0)
LYMPHS PCT: 21 %
MCH: 31.6 pg (ref 26.0–34.0)
MCHC: 32.3 g/dL (ref 30.0–36.0)
MCV: 97.9 fL (ref 78.0–100.0)
MONO ABS: 0.5 10*3/uL (ref 0.1–1.0)
MONOS PCT: 6 %
NEUTROS ABS: 6.2 10*3/uL (ref 1.7–7.7)
NEUTROS PCT: 69 %
Platelets: 205 10*3/uL (ref 150–400)
RBC: 3.83 MIL/uL — ABNORMAL LOW (ref 3.87–5.11)
RDW: 13.5 % (ref 11.5–15.5)
WBC: 9 10*3/uL (ref 4.0–10.5)

## 2015-10-23 LAB — URINE MICROSCOPIC-ADD ON

## 2015-10-23 LAB — BASIC METABOLIC PANEL WITH GFR
Anion gap: 8 (ref 5–15)
BUN: 27 mg/dL — ABNORMAL HIGH (ref 6–20)
CO2: 24 mmol/L (ref 22–32)
Calcium: 8.9 mg/dL (ref 8.9–10.3)
Chloride: 115 mmol/L — ABNORMAL HIGH (ref 101–111)
Creatinine, Ser: 1.33 mg/dL — ABNORMAL HIGH (ref 0.44–1.00)
GFR calc Af Amer: 41 mL/min — ABNORMAL LOW
GFR calc non Af Amer: 35 mL/min — ABNORMAL LOW
Glucose, Bld: 119 mg/dL — ABNORMAL HIGH (ref 65–99)
Potassium: 3.6 mmol/L (ref 3.5–5.1)
Sodium: 147 mmol/L — ABNORMAL HIGH (ref 135–145)

## 2015-10-23 LAB — URINALYSIS, ROUTINE W REFLEX MICROSCOPIC
Bilirubin Urine: NEGATIVE
Glucose, UA: NEGATIVE mg/dL
Ketones, ur: 15 mg/dL — AB
Nitrite: POSITIVE — AB
Protein, ur: 30 mg/dL — AB
Specific Gravity, Urine: 1.029 (ref 1.005–1.030)
pH: 5.5 (ref 5.0–8.0)

## 2015-10-23 NOTE — ED Provider Notes (Signed)
CSN: [id number]     Arrival date & time 10/23/15  0546 History   First MD Initiated Contact with Patient 10/23/15 [id number]     Chief Complaint  Patient presents with  . Fall    needs foley replaced.      Patient is a 81 y.o. female presenting with fall. The history is provided by the patient and the nursing home. No language interpreter was used.  Fall   Melanie Levy is a 81 y.o. female who presents to the Emergency Department complaining of fall. Level V caveat due to dementia.  Hx is provided by the patient and nursing home records. Per her report she slipped getting out of bed this morning and sustained no injuries but her Foley was pulled out. She denies any complaints. Denies any fevers, vomiting, diarrhea, pain. Symptoms are mild and constant.  Past Medical History  Diagnosis Date  . Diverticulosis   . Esophageal stricture   . Hiatal hernia   . Iron deficiency anemia   . Depression   . Anxiety   . Arthritis   . Asthma   . Gastric ulcer 1994  . Partial small bowel obstruction ([place]) 1995  . IBS (irritable bowel syndrome)   . GERD (gastroesophageal reflux disease)   . Osteoporosis   . Insomnia   . Macular degeneration   . Seborrheic keratosis   . Stroke ([place])   . Dementia   . CKD (chronic kidney disease) stage 3, GFR 30-59 ml/min 10/14/2015  . Hypothyroidism   . Hyperlipidemia    Past Surgical History  Procedure Laterality Date  . Cholecystectomy    . Appendectomy    . Tonsillectomy    . Spine surgery    . Bunionectomy    . Sinus surgery with instatrak    . Abdominal hysterectomy     Family History  Problem Relation Age of Onset  . Ovarian cancer Mother   . Heart disease    . Macular degeneration    . Colon cancer Neg Hx    Social History  Substance Use Topics  . Smoking status: Never Smoker   . Smokeless tobacco: Never Used  . Alcohol Use: Yes     Comment: not often    OB History    No data available     Review of Systems  All other systems  reviewed and are negative.     Allergies  Iohexol; Milk-related compounds; and Penicillins  Home Medications   Prior to Admission medications   Medication Sig Start Date End Date Taking? Authorizing Provider  acetaminophen (TYLENOL) 325 MG tablet Take 650 mg by mouth every 6 (six) hours as needed for moderate pain or fever.    Historical Provider, MD  atorvastatin (LIPITOR) 10 MG tablet Take 10 mg by mouth daily at 6 PM.     Historical Provider, MD  clotrimazole (LOTRIMIN) 1 % cream Apply 1 application topically 2 (two) times daily as needed (rash).    Historical Provider, MD  famotidine (PEPCID) 20 MG tablet Take 1 tablet (20 mg total) by mouth daily. 10/19/15   Lavina Hamman, MD  FLUoxetine (PROZAC) 20 MG capsule Take 20 mg by mouth daily.    Historical Provider, MD  Fluticasone Furoate-Vilanterol 100-25 MCG/INH AEPB Inhale 1 puff into the lungs daily.    Historical Provider, MD  guaifenesin (ROBITUSSIN) 100 MG/5ML syrup Take 100 mg by mouth 4 (four) times daily as needed for cough.    Historical Provider, MD  iron polysaccharides (NIFEREX)  150 MG capsule Take 150 mg by mouth 2 (two) times daily.      Historical Provider, MD  levothyroxine (SYNTHROID, LEVOTHROID) 75 MCG tablet Take 75 mcg by mouth daily before breakfast.    Historical Provider, MD  loratadine (CLARITIN) 10 MG tablet Take 10 mg by mouth daily.    Historical Provider, MD  LORazepam (ATIVAN) 0.5 MG tablet Take 1 tablet (0.5 mg total) by mouth 2 (two) times daily. 10/20/15   Lavina Hamman, MD  megestrol (MEGACE) 40 MG/ML suspension Take 10 mLs (400 mg total) by mouth daily. 10/20/15   Lavina Hamman, MD  Memantine HCl ER (NAMENDA XR) 28 MG CP24 Take 28 mg by mouth daily.    Historical Provider, MD  metroNIDAZOLE (FLAGYL) 500 MG tablet Take 1 tablet (500 mg total) by mouth every 8 (eight) hours. 10/19/15   Lavina Hamman, MD  mirtazapine (REMERON) 15 MG tablet Take 15 mg by mouth at bedtime.      Historical Provider, MD   Multiple Vitamins-Minerals (PRESERVISION AREDS 2 PO) Take 1 capsule by mouth 2 (two) times daily.     Historical Provider, MD  PRESCRIPTION MEDICATION Apply 1 application topically as needed (for itching to skin). TRIAMCINOLONE 0.1% AND HYDROCERIN 2:1 CREAM    Historical Provider, MD  saccharomyces boulardii (FLORASTOR) 250 MG capsule Take 1 capsule (250 mg total) by mouth 2 (two) times daily. 10/19/15   Lavina Hamman, MD  tamsulosin (FLOMAX) 0.4 MG CAPS capsule Take 1 capsule (0.4 mg total) by mouth daily after supper. 10/20/15   Lavina Hamman, MD  traMADol (ULTRAM) 50 MG tablet Take 1 tablet (50 mg total) by mouth every 8 (eight) hours as needed (for pain). 10/20/15   Lavina Hamman, MD  Triamcinolone Acetonide (TRIAMCINOLONE 0.1 % CREAM : EUCERIN) CREA Apply 1 application topically 3 (three) times daily as needed for itching.    Historical Provider, MD  Vitamin D, Ergocalciferol, (DRISDOL) 50000 UNITS CAPS capsule Take 50,000 Units by mouth every 7 (seven) days.    Historical Provider, MD   BP 144/79 mmHg  Pulse 83  Temp(Src) 99 F (37.2 C) (Oral)  Resp 12  SpO2 98% Physical Exam  Constitutional: She appears well-developed and well-nourished.  HENT:  Head: Normocephalic and atraumatic.  Cardiovascular: Normal rate and regular rhythm.   No murmur heard. Pulmonary/Chest: Effort normal and breath sounds normal. No respiratory distress.  Abdominal: Soft. There is no rebound and no guarding.  Soft midline abdominal hernia that is nontender  Musculoskeletal: She exhibits no edema or tenderness.  Neurological: She is alert.  Disoriented to time.  Mild generalized weakness.  Skin: Skin is warm and dry.  Psychiatric: She has a normal mood and affect. Her behavior is normal.  Nursing note and vitals reviewed.   ED Course  Procedures (including critical care time) Labs Review Labs Reviewed  BASIC METABOLIC PANEL - Abnormal; Notable for the following:    Sodium 147 (*)    Chloride 115  (*)    Glucose, Bld 119 (*)    BUN 27 (*)    Creatinine, Ser 1.33 (*)    GFR calc non Af Amer 35 (*)    GFR calc Af Amer 41 (*)    All other components within normal limits  CBC WITH DIFFERENTIAL/PLATELET - Abnormal; Notable for the following:    RBC 3.83 (*)    All other components within normal limits  URINALYSIS, ROUTINE W REFLEX MICROSCOPIC (NOT AT [facility]) - Abnormal; Notable  for the following:    Color, Urine RED (*)    APPearance CLOUDY (*)    Hgb urine dipstick MODERATE (*)    Ketones, ur 15 (*)    Protein, ur 30 (*)    Nitrite POSITIVE (*)    Leukocytes, UA SMALL (*)    All other components within normal limits  URINE MICROSCOPIC-ADD ON - Abnormal; Notable for the following:    Squamous Epithelial / LPF 0-5 (*)    Bacteria, UA FEW (*)    All other components within normal limits  URINE CULTURE    Imaging Review No results found. I have personally reviewed and evaluated these images and lab results as part of my medical decision-making.   EKG Interpretation None      MDM   Final diagnoses:  Encounter for Foley catheter replacement  Hypernatremia    Patient here for evaluation of Foley catheter dislodgment. Foley catheter replaced in the emergency department. Discussed with the patient's daughter, she is requesting recheck of the patient's creatinine given recent problems with kidney injury. Creatinine is near baseline with mild increase in her sodium, patient is slightly dehydrated. Recommend encouraging oral fluid intake. UA is nitrite and leukocyte positive, current picture is not consistent with UTI, culture sent.    Quintella Reichert, MD 10/23/15 [phone]

## 2015-10-23 NOTE — Discharge Instructions (Signed)
Melanie Levy had a foley catheter replaced in the Emergency Department.  She is slightly dehydrated on today's labs with an elevated sodium.  Please encourage her to drink fluids. She will need her labs rechecked by her family doctor.  Foley Catheter Care, Adult A Foley catheter is a soft, flexible tube that is placed into the bladder to drain urine. A Foley catheter may be inserted if:  You leak urine or are not able to control when you urinate (urinary incontinence).  You are not able to urinate when you need to (urinary retention).  You had prostate surgery or surgery on the genitals.  You have certain medical conditions, such as multiple sclerosis, dementia, or a spinal cord injury. If you are going home with a Foley catheter in place, follow the instructions below. TAKING CARE OF THE CATHETER 1. Wash your hands with soap and water. 2. Using mild soap and warm water on a clean washcloth:  Clean the area on your body closest to the catheter insertion site using a circular motion, moving away from the catheter. Never wipe toward the catheter because this could sweep bacteria up into the urethra and cause infection.  Remove all traces of soap. Pat the area dry with a clean towel. For males, reposition the foreskin. 3. Attach the catheter to your leg so there is no tension on the catheter. Use adhesive tape or a leg strap. If you are using adhesive tape, remove any sticky residue left behind by the previous tape you used. 4. Keep the drainage bag below the level of the bladder, but keep it off the floor. 5. Check throughout the day to be sure the catheter is working and urine is draining freely. Make sure the tubing does not become kinked. 6. Do not pull on the catheter or try to remove it. Pulling could damage internal tissues. TAKING CARE OF THE DRAINAGE BAGS You will be given two drainage bags to take home. One is a large overnight drainage bag, and the other is a smaller leg bag that  fits underneath clothing. You may wear the overnight bag at any time, but you should never wear the smaller leg bag at night. Follow the instructions below for how to empty, change, and clean your drainage bags. Emptying the Drainage Bag You must empty your drainage bag when it is  - full or at least 2-3 times a day. 1. Wash your hands with soap and water. 2. Keep the drainage bag below your hips, below the level of your bladder. This stops urine from going back into the tubing and into your bladder. 3. Hold the dirty bag over the toilet or a clean container. 4. Open the pour spout at the bottom of the bag and empty the urine into the toilet or container. Do not let the pour spout touch the toilet, container, or any other surface. Doing so can place bacteria on the bag, which can cause an infection. 5. Clean the pour spout with a gauze pad or cotton ball that has rubbing alcohol on it. 6. Close the pour spout. 7. Attach the bag to your leg with adhesive tape or a leg strap. 8. Wash your hands well. Changing the Drainage Bag Change your drainage bag once a month or sooner if it starts to smell bad or look dirty. Below are steps to follow when changing the drainage bag. 1. Wash your hands with soap and water. 2. Pinch off the rubber catheter so that urine does not spill  out. 3. Disconnect the catheter tube from the drainage tube at the connection valve. Do not let the tubes touch any surface. 4. Clean the end of the catheter tube with an alcohol wipe. Use a different alcohol wipe to clean the end of the drainage tube. 5. Connect the catheter tube to the drainage tube of the clean drainage bag. 6. Attach the new bag to the leg with adhesive tape or a leg strap. Avoid attaching the new bag too tightly. 7. Wash your hands well. Cleaning the Drainage Bag 1. Wash your hands with soap and water. 2. Wash the bag in warm, soapy water. 3. Rinse the bag thoroughly with warm water. 4. Fill the bag with a  solution of white vinegar and water (1 cup vinegar to 1 qt warm water [.2 L vinegar to 1 L warm water]). Close the bag and soak it for 30 minutes in the solution. 5. Rinse the bag with warm water. 6. Hang the bag to dry with the pour spout open and hanging downward. 7. Store the clean bag (once it is dry) in a clean plastic bag. 8. Wash your hands well. PREVENTING INFECTION  Wash your hands before and after handling your catheter.  Take showers daily and wash the area where the catheter enters your body. Do not take baths. Replace wet leg straps with dry ones, if this applies.  Do not use powders, sprays, or lotions on the genital area. Only use creams, lotions, or ointments as directed by your caregiver.  For females, wipe from front to back after each bowel movement.  Drink enough fluids to keep your urine clear or pale yellow unless you have a fluid restriction.  Do not let the drainage bag or tubing touch or lie on the floor.  Wear cotton underwear to absorb moisture and to keep your skin drier. SEEK MEDICAL CARE IF:   Your urine is cloudy or smells unusually bad.  Your catheter becomes clogged.  You are not draining urine into the bag or your bladder feels full.  Your catheter starts to leak. SEEK IMMEDIATE MEDICAL CARE IF:   You have pain, swelling, redness, or pus where the catheter enters the body.  You have pain in the abdomen, legs, lower back, or bladder.  You have a fever.  You see blood fill the catheter, or your urine is pink or red.  You have nausea, vomiting, or chills.  Your catheter gets pulled out. MAKE SURE YOU:   Understand these instructions.  Will watch your condition.  Will get help right away if you are not doing well or get worse.   This information is not intended to replace advice given to you by your health care provider. Make sure you discuss any questions you have with your health care provider.   Document Released: 10/28/2005  Document Revised: 03/14/2014 Document Reviewed: 10/19/2012 Elsevier Interactive Patient Education [employer].

## 2015-10-23 NOTE — ED Notes (Signed)
Leg catheter bag placed on right thigh of pt

## 2015-10-23 NOTE — ED Notes (Signed)
Bed: [id number] Expected date:  Expected time:  Means of arrival:  Comments: EMS SNF needs foley replaced

## 2015-10-23 NOTE — ED Notes (Signed)
Bladder scan showed >48 mL urine in bladder. None draining into foley bag or tubing.

## 2015-10-23 NOTE — ED Notes (Signed)
This was second insertion attempt. Pt vaginal area was red and swollen so it was difficult to insert completely into bladder. Pt has history of difficult insertion, per RN. Peri care completed in vaginal and surrounding area with cream to prevent further irritation. Marland Kitchen

## 2015-10-23 NOTE — ED Notes (Signed)
[facility] called. Report given to [facility].

## 2015-10-23 NOTE — ED Notes (Signed)
Bladder scan post insertion:3ml. Pt brief had no sign of urine, slight stool.

## 2015-10-23 NOTE — ED Notes (Signed)
Per EMS pt is from [facility].  Pt told EMS that she slipped getting out of bed, denies hitting his head or LOC.  Pt's foley was pulled out from fall and needs to be replaced.  Pt denies any pain.

## 2015-10-24 LAB — URINE CULTURE: CULTURE: NO GROWTH

## 2015-10-27 ENCOUNTER — Encounter (HOSPITAL_COMMUNITY): Payer: Self-pay | Admitting: Emergency Medicine

## 2015-10-27 ENCOUNTER — Emergency Department (HOSPITAL_COMMUNITY): Payer: Medicare Other

## 2015-10-27 ENCOUNTER — Emergency Department (HOSPITAL_COMMUNITY)
Admission: EM | Admit: 2015-10-27 | Discharge: 2015-10-28 | Disposition: A | Discharge status: Home / Self Care | Payer: Medicare Other | Attending: Emergency Medicine | Admitting: Emergency Medicine

## 2015-10-27 DIAGNOSIS — Z79899 Other long term (current) drug therapy: Secondary | ICD-10-CM | POA: Diagnosis not present

## 2015-10-27 DIAGNOSIS — M810 Age-related osteoporosis without current pathological fracture: Secondary | ICD-10-CM | POA: Diagnosis not present

## 2015-10-27 DIAGNOSIS — Z8673 Personal history of transient ischemic attack (TIA), and cerebral infarction without residual deficits: Secondary | ICD-10-CM | POA: Insufficient documentation

## 2015-10-27 DIAGNOSIS — K5900 Constipation, unspecified: Secondary | ICD-10-CM | POA: Diagnosis not present

## 2015-10-27 DIAGNOSIS — Z880 Allergy status to penicillin: Secondary | ICD-10-CM | POA: Diagnosis not present

## 2015-10-27 DIAGNOSIS — E039 Hypothyroidism, unspecified: Secondary | ICD-10-CM | POA: Diagnosis not present

## 2015-10-27 DIAGNOSIS — F329 Major depressive disorder, single episode, unspecified: Secondary | ICD-10-CM | POA: Diagnosis not present

## 2015-10-27 DIAGNOSIS — Z872 Personal history of diseases of the skin and subcutaneous tissue: Secondary | ICD-10-CM | POA: Diagnosis not present

## 2015-10-27 DIAGNOSIS — Z7951 Long term (current) use of inhaled steroids: Secondary | ICD-10-CM | POA: Insufficient documentation

## 2015-10-27 DIAGNOSIS — Z9049 Acquired absence of other specified parts of digestive tract: Secondary | ICD-10-CM | POA: Insufficient documentation

## 2015-10-27 DIAGNOSIS — M1990 Unspecified osteoarthritis, unspecified site: Secondary | ICD-10-CM | POA: Diagnosis not present

## 2015-10-27 DIAGNOSIS — D509 Iron deficiency anemia, unspecified: Secondary | ICD-10-CM | POA: Diagnosis not present

## 2015-10-27 DIAGNOSIS — J45909 Unspecified asthma, uncomplicated: Secondary | ICD-10-CM | POA: Insufficient documentation

## 2015-10-27 DIAGNOSIS — K219 Gastro-esophageal reflux disease without esophagitis: Secondary | ICD-10-CM | POA: Insufficient documentation

## 2015-10-27 DIAGNOSIS — N390 Urinary tract infection, site not specified: Secondary | ICD-10-CM | POA: Diagnosis not present

## 2015-10-27 DIAGNOSIS — G4700 Insomnia, unspecified: Secondary | ICD-10-CM | POA: Diagnosis not present

## 2015-10-27 DIAGNOSIS — R011 Cardiac murmur, unspecified: Secondary | ICD-10-CM | POA: Diagnosis not present

## 2015-10-27 DIAGNOSIS — F0390 Unspecified dementia without behavioral disturbance: Secondary | ICD-10-CM | POA: Diagnosis not present

## 2015-10-27 DIAGNOSIS — N183 Chronic kidney disease, stage 3 (moderate): Secondary | ICD-10-CM | POA: Diagnosis not present

## 2015-10-27 DIAGNOSIS — R109 Unspecified abdominal pain: Secondary | ICD-10-CM | POA: Diagnosis present

## 2015-10-27 DIAGNOSIS — E785 Hyperlipidemia, unspecified: Secondary | ICD-10-CM | POA: Insufficient documentation

## 2015-10-27 DIAGNOSIS — F419 Anxiety disorder, unspecified: Secondary | ICD-10-CM | POA: Diagnosis not present

## 2015-10-27 DIAGNOSIS — S50312A Abrasion of left elbow, initial encounter: Secondary | ICD-10-CM | POA: Diagnosis not present

## 2015-10-27 LAB — CBC WITH DIFFERENTIAL/PLATELET
Basophils Absolute: 0 10*3/uL (ref 0.0–0.1)
Basophils Relative: 0 %
EOS ABS: 0.3 10*3/uL (ref 0.0–0.7)
EOS PCT: 3 %
HCT: 35.7 % — ABNORMAL LOW (ref 36.0–46.0)
Hemoglobin: 11.4 g/dL — ABNORMAL LOW (ref 12.0–15.0)
LYMPHS ABS: 1.8 10*3/uL (ref 0.7–4.0)
LYMPHS PCT: 20 %
MCH: 31.9 pg (ref 26.0–34.0)
MCHC: 31.9 g/dL (ref 30.0–36.0)
MCV: 100 fL (ref 78.0–100.0)
MONO ABS: 0.5 10*3/uL (ref 0.1–1.0)
Monocytes Relative: 6 %
Neutro Abs: 6.4 10*3/uL (ref 1.7–7.7)
Neutrophils Relative %: 71 %
PLATELETS: 248 10*3/uL (ref 150–400)
RBC: 3.57 MIL/uL — ABNORMAL LOW (ref 3.87–5.11)
RDW: 13.7 % (ref 11.5–15.5)
WBC: 9 10*3/uL (ref 4.0–10.5)

## 2015-10-27 NOTE — ED Provider Notes (Signed)
CSN: [id number]     Arrival date & time 10/27/15  2240 History  By signing my name below, I, Terrance Branch, attest that this documentation has been prepared under the direction and in the presence of Varney Biles, MD. Electronically Signed: Randa Evens, ED Scribe. 10/28/2015. 2:39 AM.      Chief Complaint  Patient presents with  . Abdominal Pain    The history is provided by the patient. No language interpreter was used.   HPI Comments: Melanie Levy is a 81 y.o. female brought in by ambulance, who presents to the Emergency Department complaining of abdominal pain onset 1 week prior. Pt reports constipation for the past week. Pt states that she has decreased appetite and feeling as if she is bloated. Denies nausea, vomiting, dysuria, hematuria. She is unsure if she is passing flatus. Records indicate recent admission to the hospital for enteritis and UTI, treated with antibitics   Past Medical History  Diagnosis Date  . Diverticulosis   . Esophageal stricture   . Hiatal hernia   . Iron deficiency anemia   . Depression   . Anxiety   . Arthritis   . Asthma   . Gastric ulcer 1994  . Partial small bowel obstruction ([place]) 1995  . IBS (irritable bowel syndrome)   . GERD (gastroesophageal reflux disease)   . Osteoporosis   . Insomnia   . Macular degeneration   . Seborrheic keratosis   . Stroke ([place])   . Dementia   . CKD (chronic kidney disease) stage 3, GFR 30-59 ml/min 10/14/2015  . Hypothyroidism   . Hyperlipidemia    Past Surgical History  Procedure Laterality Date  . Cholecystectomy    . Appendectomy    . Tonsillectomy    . Spine surgery    . Bunionectomy    . Sinus surgery with instatrak    . Abdominal hysterectomy     Family History  Problem Relation Age of Onset  . Ovarian cancer Mother   . Heart disease    . Macular degeneration    . Colon cancer Neg Hx    Social History  Substance Use Topics  . Smoking status: Never Smoker   . Smokeless  tobacco: Never Used  . Alcohol Use: Yes     Comment: not often    OB History    No data available     Review of Systems  Gastrointestinal: Positive for abdominal pain. Negative for nausea and vomiting.  Genitourinary: Negative for dysuria and hematuria.    ROS 10 Systems reviewed and are negative for acute change except as noted in the HPI.      Allergies  Iohexol; Milk-related compounds; Nexium; and Penicillins  Home Medications   Prior to Admission medications   Medication Sig Start Date End Date Taking? Authorizing Provider  acetaminophen (TYLENOL) 325 MG tablet Take 650 mg by mouth every 6 (six) hours as needed for moderate pain or fever.   Yes Historical Provider, MD  atorvastatin (LIPITOR) 10 MG tablet Take 10 mg by mouth daily at 6 PM.    Yes Historical Provider, MD  FLUoxetine (PROZAC) 20 MG capsule Take 20 mg by mouth daily.   Yes Historical Provider, MD  Fluticasone Furoate-Vilanterol 100-25 MCG/INH AEPB Inhale 1 puff into the lungs daily.   Yes Historical Provider, MD  guaifenesin (ROBITUSSIN) 100 MG/5ML syrup Take 100 mg by mouth 4 (four) times daily as needed for cough.   Yes Historical Provider, MD  hydrOXYzine (VISTARIL) 25 MG capsule  Take 25 mg by mouth every 8 (eight) hours as needed for itching.   Yes Historical Provider, MD  iron polysaccharides (NIFEREX) 150 MG capsule Take 150 mg by mouth 2 (two) times daily.     Yes Historical Provider, MD  levothyroxine (SYNTHROID, LEVOTHROID) 75 MCG tablet Take 75 mcg by mouth daily before breakfast.   Yes Historical Provider, MD  loratadine (CLARITIN) 10 MG tablet Take 10 mg by mouth daily.   Yes Historical Provider, MD  LORazepam (ATIVAN) 0.5 MG tablet Take 1 tablet (0.5 mg total) by mouth 2 (two) times daily. 10/20/15  Yes Lavina Hamman, MD  Memantine HCl ER (NAMENDA XR) 28 MG CP24 Take 28 mg by mouth daily.   Yes Historical Provider, MD  mirtazapine (REMERON) 15 MG tablet Take 15 mg by mouth at bedtime.     Yes  Historical Provider, MD  Multiple Vitamins-Minerals (PRESERVISION AREDS 2 PO) Take 1 capsule by mouth 2 (two) times daily.    Yes Historical Provider, MD  omeprazole (PRILOSEC) 20 MG capsule Take 20 mg by mouth daily.   Yes Historical Provider, MD  PRESCRIPTION MEDICATION Apply 1 application topically as needed (for itching to skin). TRIAMCINOLONE 0.1% AND HYDROCERIN 2:1 CREAM   Yes Historical Provider, MD  saccharomyces boulardii (FLORASTOR) 250 MG capsule Take 1 capsule (250 mg total) by mouth 2 (two) times daily. 10/19/15  Yes Lavina Hamman, MD  Skin Protectants, Misc. (EUCERIN) cream Apply 1 application topically. After baths and as needed for dry skin.   Yes Historical Provider, MD  tamsulosin (FLOMAX) 0.4 MG CAPS capsule Take 1 capsule (0.4 mg total) by mouth daily after supper. 10/20/15  Yes Lavina Hamman, MD  traMADol (ULTRAM) 50 MG tablet Take 1 tablet (50 mg total) by mouth every 8 (eight) hours as needed (for pain). 10/20/15  Yes Lavina Hamman, MD  Vitamin D, Ergocalciferol, (DRISDOL) 50000 UNITS CAPS capsule Take 50,000 Units by mouth every 7 (seven) days.   Yes Historical Provider, MD  ciprofloxacin (CIPRO) 500 MG tablet Take 1 tablet (500 mg total) by mouth 2 (two) times daily. 10/28/15   Varney Biles, MD  famotidine (PEPCID) 20 MG tablet Take 1 tablet (20 mg total) by mouth daily. Patient not taking: Reported on 10/27/2015 10/19/15   Lavina Hamman, MD  megestrol (MEGACE) 40 MG/ML suspension Take 10 mLs (400 mg total) by mouth daily. 10/28/15   Varney Biles, MD  metroNIDAZOLE (FLAGYL) 500 MG tablet Take 1 tablet (500 mg total) by mouth every 8 (eight) hours. Patient not taking: Reported on 10/27/2015 10/19/15   Lavina Hamman, MD   BP 159/73 mmHg  Pulse 76  Temp(Src) 99 F (37.2 C)  Resp 18  SpO2 100%   Physical Exam  Constitutional: She is oriented to person, place, and time. She appears well-developed and well-nourished. No distress.  HENT:  Head: Normocephalic and  atraumatic.  Eyes: Conjunctivae and EOM are normal.  Neck: Neck supple. No tracheal deviation present.  Cardiovascular: Normal rate and regular rhythm.   Murmur heard.  Systolic murmur is present  Pulmonary/Chest: Effort normal and breath sounds normal. No respiratory distress.  Lung clear to auscultation   Abdominal: Bowel sounds are normal. She exhibits no mass. There is tenderness. There is no rebound.  Pt has inguinal hernia that is reducible. Diffuse tenderness over abdomen.   Musculoskeletal: Normal range of motion.  Neurological: She is alert and oriented to person, place, and time.  Skin: Skin is warm and dry.  Psychiatric: She has a normal mood and affect. Her behavior is normal.  Nursing note and vitals reviewed.   ED Course  Procedures (including critical care time) DIAGNOSTIC STUDIES: Oxygen Saturation is 100% on RA, normal by my interpretation.    COORDINATION OF CARE:    Labs Review Labs Reviewed  CBC WITH DIFFERENTIAL/PLATELET - Abnormal; Notable for the following:    RBC 3.57 (*)    Hemoglobin 11.4 (*)    HCT 35.7 (*)    All other components within normal limits  BASIC METABOLIC PANEL - Abnormal; Notable for the following:    Chloride 114 (*)    CO2 19 (*)    BUN 31 (*)    Creatinine, Ser 1.09 (*)    Calcium 8.6 (*)    GFR calc non Af Amer 45 (*)    GFR calc Af Amer 52 (*)    All other components within normal limits  URINALYSIS, ROUTINE W REFLEX MICROSCOPIC (NOT AT [facility]) - Abnormal; Notable for the following:    APPearance CLOUDY (*)    Leukocytes, UA MODERATE (*)    All other components within normal limits  URINE MICROSCOPIC-ADD ON - Abnormal; Notable for the following:    Squamous Epithelial / LPF 0-5 (*)    Bacteria, UA MANY (*)    All other components within normal limits    Imaging Review Ct Abdomen Pelvis Wo Contrast  10/28/2015  CLINICAL DATA:  Lower quadrant tenderness. EXAM: CT ABDOMEN AND PELVIS WITHOUT CONTRAST TECHNIQUE:  Multidetector CT imaging of the abdomen and pelvis was performed following the standard protocol without IV contrast. COMPARISON:  10/14/2015 FINDINGS: Atelectasis in the lung bases. Large esophageal hiatal hernia containing much of the stomach. Low-attenuation lesion along the diaphragm on the right, measuring about 2.4 cm diameter. This is not change since previous study and probably represents a developmental cystic structure such is a duplication cyst. Cyst centrally in the liver measuring 2.8 cm without change. Surgical absence of the gallbladder. No bile duct dilatation. The unenhanced appearance of the spleen, pancreas, adrenal glands, kidneys, inferior vena cava, and retroperitoneal lymph nodes is unremarkable. Scattered calcification in the aorta without aneurysm. Stomach, small bowel, and colon are not abnormally distended. No bowel wall thickening is appreciated. There is a midline subxiphoid anterior abdominal wall hernia containing fat and now containing a small amount of small bowel. There is no proximal bowel obstruction. There is infiltration within the herniated fat which may indicate fat necrosis. No free air or free fluid in the abdomen. Pelvis: Uterus and ovaries are not enlarged. Bladder wall is not thickened. No free or loculated pelvic fluid collections. No pelvic mass or lymphadenopathy. The appendix is not identified. Prominent stool-filled rectum. Diverticulosis of the sigmoid colon without evidence of diverticulitis. Degenerative changes in the spine and hips. Nonspecific calcifications demonstrated in the sacrum. Old fracture deformities of the right superior and inferior pubic rami. Slight anterior subluxation of L3 on L4. Superior endplate compression of T10. Bone changes are similar to previous study. IMPRESSION: Large esophageal hiatal hernia containing most of the stomach. Midline anterior abdominal wall hernia containing fat with possible fat necrosis and now containing a small  amount of small bowel. No evidence of small bowel obstruction. Cystic structure in the right costophrenic angle probably representing a benign developmental cyst. Electronically Signed   By: Lucienne Capers M.D.   On: 10/28/2015 02:16   Dg Abd Acute W/chest  10/27/2015  CLINICAL DATA:  Mid and lower abdominal pain for 1  week, evaluate for obstruction. EXAM: DG ABDOMEN ACUTE W/ 1V CHEST COMPARISON:  Chest and abdominal radiographs 10/16/2015. CT 10/14/2015 FINDINGS: Large hiatal hernia. Cardiomediastinal contours are unchanged. No consolidation. No free intra-abdominal air. No dilated bowel loops to suggest obstruction. Moderate stool throughout the colon with enteric contrast within multiple distal colonic diverticula from prior CT. A stool ball distends the rectum. Surgical clips in the right upper quadrant the abdomen from cholecystectomy. Remote right pelvis fracture again seen. Bones under mineralized. IMPRESSION: 1. No evidence of bowel obstruction. Moderate stool burden with stool ball in the rectum. Diverticulosis with retained enteric contrast within colonic diverticula. 2. Hiatal hernia. Electronically Signed   By: Jeb Levering M.D.   On: 10/27/2015 23:48   I have personally reviewed and evaluated these images and lab results as part of my medical decision-making.   EKG Interpretation   Date/Time:  Saturday October 28 2015 01:28:35 EST Ventricular Rate:  81 PR Interval:  192 QRS Duration: 80 QT Interval:  402 QTC Calculation: 467 R Axis:   8 Text Interpretation:  Sinus rhythm normal intervals No acute changes  Confirmed by Kathrynn Humble, MD, [place] Surgeon [phone]) on 10/28/2015 2:00:48 AM      MDM   Final diagnoses:  Constipation, unspecified constipation type  UTI (lower urinary tract infection)   I personally performed the services described in this documentation, which was scribed in my presence. The recorded information has been reviewed and is accurate.  Pt comes in with cc of  abdominal pain, and nausea. She has diffuse tenderness, worse over the lower quadrants. Pt has normal vitals.  AAS ordered  - and there is no signs of obstruction. Labs are mostly reassuring.  Pt reassessed, and she had persistent lower quadrant tenderness. Previous CT scan from recent admission reviewed. Pt had enteritis at that time, and she was treated with antibiotics and conservative measures. She also had UTI, and her cultures from the previous visits show mostly ecoli, but also an episode wioth strain of staph.  We did a CT scan - to ensure that the enteritis is resolved and there is no new acute pathology. Ct scan is neg. Resultas discussed. Antibiotics and stool laxatives prescribed,      Varney Biles, MD 10/28/15 [phone]

## 2015-10-27 NOTE — ED Notes (Signed)
Pt's contact:  Daughter---- tel# [phone]

## 2015-10-27 NOTE — ED Notes (Signed)
Bed: [id number] Expected date:  Expected time:  Means of arrival:  Comments: 77F abd pain 10/10

## 2015-10-27 NOTE — ED Notes (Signed)
Brought in by EMS from [facility] facility with c/o abdominal pain.  Pt reports that she has been having progressive abdominal pain with nausea, no vomiting.  Was hospitalized on the 3rd of this month for abdominal edema and mass with nausea and vomiting.  Also reports that she has had not bowel movement for "more than a week".

## 2015-10-28 ENCOUNTER — Emergency Department (HOSPITAL_COMMUNITY): Payer: Medicare Other

## 2015-10-28 ENCOUNTER — Emergency Department (HOSPITAL_COMMUNITY)
Admission: EM | Admit: 2015-10-28 | Discharge: 2015-10-28 | Disposition: A | Discharge status: Home / Self Care | Payer: Medicare Other | Attending: Emergency Medicine | Admitting: Emergency Medicine

## 2015-10-28 ENCOUNTER — Encounter (HOSPITAL_COMMUNITY): Payer: Self-pay | Admitting: Emergency Medicine

## 2015-10-28 DIAGNOSIS — Z880 Allergy status to penicillin: Secondary | ICD-10-CM | POA: Diagnosis not present

## 2015-10-28 DIAGNOSIS — Y998 Other external cause status: Secondary | ICD-10-CM | POA: Insufficient documentation

## 2015-10-28 DIAGNOSIS — Z7951 Long term (current) use of inhaled steroids: Secondary | ICD-10-CM | POA: Diagnosis not present

## 2015-10-28 DIAGNOSIS — S50312A Abrasion of left elbow, initial encounter: Secondary | ICD-10-CM | POA: Insufficient documentation

## 2015-10-28 DIAGNOSIS — F0390 Unspecified dementia without behavioral disturbance: Secondary | ICD-10-CM | POA: Diagnosis not present

## 2015-10-28 DIAGNOSIS — W01190A Fall on same level from slipping, tripping and stumbling with subsequent striking against furniture, initial encounter: Secondary | ICD-10-CM | POA: Diagnosis not present

## 2015-10-28 DIAGNOSIS — F329 Major depressive disorder, single episode, unspecified: Secondary | ICD-10-CM | POA: Insufficient documentation

## 2015-10-28 DIAGNOSIS — G4700 Insomnia, unspecified: Secondary | ICD-10-CM | POA: Insufficient documentation

## 2015-10-28 DIAGNOSIS — N390 Urinary tract infection, site not specified: Secondary | ICD-10-CM | POA: Diagnosis not present

## 2015-10-28 DIAGNOSIS — Z872 Personal history of diseases of the skin and subcutaneous tissue: Secondary | ICD-10-CM | POA: Insufficient documentation

## 2015-10-28 DIAGNOSIS — J45909 Unspecified asthma, uncomplicated: Secondary | ICD-10-CM | POA: Insufficient documentation

## 2015-10-28 DIAGNOSIS — M810 Age-related osteoporosis without current pathological fracture: Secondary | ICD-10-CM | POA: Diagnosis not present

## 2015-10-28 DIAGNOSIS — Y9389 Activity, other specified: Secondary | ICD-10-CM | POA: Insufficient documentation

## 2015-10-28 DIAGNOSIS — Z87440 Personal history of urinary (tract) infections: Secondary | ICD-10-CM | POA: Insufficient documentation

## 2015-10-28 DIAGNOSIS — D509 Iron deficiency anemia, unspecified: Secondary | ICD-10-CM | POA: Insufficient documentation

## 2015-10-28 DIAGNOSIS — W19XXXA Unspecified fall, initial encounter: Secondary | ICD-10-CM

## 2015-10-28 DIAGNOSIS — E785 Hyperlipidemia, unspecified: Secondary | ICD-10-CM | POA: Diagnosis not present

## 2015-10-28 DIAGNOSIS — Y9289 Other specified places as the place of occurrence of the external cause: Secondary | ICD-10-CM | POA: Diagnosis not present

## 2015-10-28 DIAGNOSIS — K219 Gastro-esophageal reflux disease without esophagitis: Secondary | ICD-10-CM | POA: Diagnosis not present

## 2015-10-28 DIAGNOSIS — S0990XA Unspecified injury of head, initial encounter: Secondary | ICD-10-CM | POA: Diagnosis present

## 2015-10-28 DIAGNOSIS — Z8673 Personal history of transient ischemic attack (TIA), and cerebral infarction without residual deficits: Secondary | ICD-10-CM | POA: Diagnosis not present

## 2015-10-28 DIAGNOSIS — F419 Anxiety disorder, unspecified: Secondary | ICD-10-CM | POA: Diagnosis not present

## 2015-10-28 DIAGNOSIS — S79911A Unspecified injury of right hip, initial encounter: Secondary | ICD-10-CM | POA: Insufficient documentation

## 2015-10-28 DIAGNOSIS — E039 Hypothyroidism, unspecified: Secondary | ICD-10-CM | POA: Insufficient documentation

## 2015-10-28 DIAGNOSIS — N183 Chronic kidney disease, stage 3 (moderate): Secondary | ICD-10-CM | POA: Diagnosis not present

## 2015-10-28 DIAGNOSIS — Z79899 Other long term (current) drug therapy: Secondary | ICD-10-CM | POA: Insufficient documentation

## 2015-10-28 LAB — URINALYSIS, ROUTINE W REFLEX MICROSCOPIC
Bilirubin Urine: NEGATIVE
GLUCOSE, UA: NEGATIVE mg/dL
Hgb urine dipstick: NEGATIVE
Ketones, ur: NEGATIVE mg/dL
Nitrite: NEGATIVE
PH: 5.5 (ref 5.0–8.0)
PROTEIN: NEGATIVE mg/dL
Specific Gravity, Urine: 1.026 (ref 1.005–1.030)

## 2015-10-28 LAB — BASIC METABOLIC PANEL
ANION GAP: 9 (ref 5–15)
BUN: 31 mg/dL — AB (ref 6–20)
CALCIUM: 8.6 mg/dL — AB (ref 8.9–10.3)
CO2: 19 mmol/L — AB (ref 22–32)
CREATININE: 1.09 mg/dL — AB (ref 0.44–1.00)
Chloride: 114 mmol/L — ABNORMAL HIGH (ref 101–111)
GFR calc Af Amer: 52 mL/min — ABNORMAL LOW (ref >60)
GFR, EST NON AFRICAN AMERICAN: 45 mL/min — AB (ref >60)
GLUCOSE: 98 mg/dL (ref 65–99)
Potassium: 3.7 mmol/L (ref 3.5–5.1)
Sodium: 142 mmol/L (ref 135–145)

## 2015-10-28 LAB — URINE MICROSCOPIC-ADD ON: RBC / HPF: NONE SEEN RBC/hpf (ref 0–5)

## 2015-10-28 MED ORDER — IOHEXOL 300 MG/ML  SOLN
50.0000 mL | Freq: Once | INTRAMUSCULAR | Status: AC | PRN
  Administered 2015-10-28: 50 mL via ORAL

## 2015-10-28 MED ORDER — SULFAMETHOXAZOLE-TRIMETHOPRIM 800-160 MG PO TABS: 1.0000 | ORAL_TABLET | Freq: Two times a day (BID) | ORAL | Status: AC

## 2015-10-28 MED ORDER — MEGESTROL ACETATE 40 MG/ML PO SUSP: 400.0000 mg | Freq: Every day | ORAL | Status: AC

## 2015-10-28 MED ORDER — CIPROFLOXACIN HCL 500 MG PO TABS: 500.0000 mg | ORAL_TABLET | Freq: Two times a day (BID) | ORAL | Status: DC

## 2015-10-28 MED ORDER — ACETAMINOPHEN 325 MG PO TABS
650.0000 mg | ORAL_TABLET | Freq: Once | ORAL | Status: AC
  Administered 2015-10-28: 650 mg via ORAL
  Filled 2015-10-28: qty 2

## 2015-10-28 MED ORDER — CIPROFLOXACIN HCL 500 MG PO TABS
500.0000 mg | ORAL_TABLET | Freq: Once | ORAL | Status: AC
  Administered 2015-10-28: 500 mg via ORAL
  Filled 2015-10-28: qty 1

## 2015-10-28 NOTE — Discharge Instructions (Signed)

## 2015-10-28 NOTE — ED Notes (Signed)
[facility] here to transport pt back to [facility] facility.

## 2015-10-28 NOTE — ED Notes (Signed)
Pt transported to [facility] °

## 2015-10-28 NOTE — Discharge Instructions (Signed)
CT scan of the abdomen shows known hernia and moderate constipation. We are sending you home with prescription meds for the constipation. Have your doctor help you out with further management of constipation. The urine shows many bacteria and WBC - clinical concerns for UTI. Cipro prescribed.   Constipation, Adult Constipation is when a person has fewer than three bowel movements a week, has difficulty having a bowel movement, or has stools that are dry, hard, or larger than normal. As people grow older, constipation is more common. A low-fiber diet, not taking in enough fluids, and taking certain medicines may make constipation worse.  CAUSES   Certain medicines, such as antidepressants, pain medicine, iron supplements, antacids, and water pills.   Certain diseases, such as diabetes, irritable bowel syndrome (IBS), thyroid disease, or depression.   Not drinking enough water.   Not eating enough fiber-rich foods.   Stress or travel.   Lack of physical activity or exercise.   Ignoring the urge to have a bowel movement.   Using laxatives too much.  SIGNS AND SYMPTOMS   Having fewer than three bowel movements a week.   Straining to have a bowel movement.   Having stools that are hard, dry, or larger than normal.   Feeling full or bloated.   Pain in the lower abdomen.   Not feeling relief after having a bowel movement.  DIAGNOSIS  Your health care provider will take a medical history and perform a physical exam. Further testing may be done for severe constipation. Some tests may include:  A barium enema X-ray to examine your rectum, colon, and, sometimes, your small intestine.   A sigmoidoscopy to examine your lower colon.   A colonoscopy to examine your entire colon. TREATMENT  Treatment will depend on the severity of your constipation and what is causing it. Some dietary treatments include drinking more fluids and eating more fiber-rich foods. Lifestyle  treatments may include regular exercise. If these diet and lifestyle recommendations do not help, your health care provider may recommend taking over-the-counter laxative medicines to help you have bowel movements. Prescription medicines may be prescribed if over-the-counter medicines do not work.  HOME CARE INSTRUCTIONS   Eat foods that have a lot of fiber, such as fruits, vegetables, whole grains, and beans.  Limit foods high in fat and processed sugars, such as french fries, hamburgers, cookies, candies, and soda.   A fiber supplement may be added to your diet if you cannot get enough fiber from foods.   Drink enough fluids to keep your urine clear or pale yellow.   Exercise regularly or as directed by your health care provider.   Go to the restroom when you have the urge to go. Do not hold it.   Only take over-the-counter or prescription medicines as directed by your health care provider. Do not take other medicines for constipation without talking to your health care provider first.  [place] IF:   You have bright red blood in your stool.   Your constipation lasts for more than 4 days or gets worse.   You have abdominal or rectal pain.   You have thin, pencil-like stools.   You have unexplained weight loss. MAKE SURE YOU:   Understand these instructions.  Will watch your condition.  Will get help right away if you are not doing well or get worse.   This information is not intended to replace advice given to you by your health care provider. Make sure you  discuss any questions you have with your health care provider.   Document Released: 07/26/2004 Document Revised: 11/18/2014 Document Reviewed: 08/09/2013 Elsevier Interactive Patient Education 2016 Elsevier Inc.  Urinary Tract Infection Urinary tract infections (UTIs) can develop anywhere along your urinary tract. Your urinary tract is your body's drainage system for removing wastes and  extra water. Your urinary tract includes two kidneys, two ureters, a bladder, and a urethra. Your kidneys are a pair of bean-shaped organs. Each kidney is about the size of your fist. They are located below your ribs, one on each side of your spine. CAUSES Infections are caused by microbes, which are microscopic organisms, including fungi, viruses, and bacteria. These organisms are so small that they can only be seen through a microscope. Bacteria are the microbes that most commonly cause UTIs. SYMPTOMS  Symptoms of UTIs may vary by age and gender of the patient and by the location of the infection. Symptoms in young women typically include a frequent and intense urge to urinate and a painful, burning feeling in the bladder or urethra during urination. Older women and men are more likely to be tired, shaky, and weak and have muscle aches and abdominal pain. A fever may mean the infection is in your kidneys. Other symptoms of a kidney infection include pain in your back or sides below the ribs, nausea, and vomiting. DIAGNOSIS To diagnose a UTI, your caregiver will ask you about your symptoms. Your caregiver will also ask you to provide a urine sample. The urine sample will be tested for bacteria and white blood cells. White blood cells are made by your body to help fight infection. TREATMENT  Typically, UTIs can be treated with medication. Because most UTIs are caused by a bacterial infection, they usually can be treated with the use of antibiotics. The choice of antibiotic and length of treatment depend on your symptoms and the type of bacteria causing your infection. HOME CARE INSTRUCTIONS  If you were prescribed antibiotics, take them exactly as your caregiver instructs you. Finish the medication even if you feel better after you have only taken some of the medication.  Drink enough water and fluids to keep your urine clear or pale yellow.  Avoid caffeine, tea, and carbonated beverages. They tend to  irritate your bladder.  Empty your bladder often. Avoid holding urine for long periods of time.  Empty your bladder before and after sexual intercourse.  After a bowel movement, women should cleanse from front to back. Use each tissue only once. SEEK MEDICAL CARE IF:   You have back pain.  You develop a fever.  Your symptoms do not begin to resolve within 3 days. SEEK IMMEDIATE MEDICAL CARE IF:   You have severe back pain or lower abdominal pain.  You develop chills.  You have nausea or vomiting.  You have continued burning or discomfort with urination. MAKE SURE YOU:   Understand these instructions.  Will watch your condition.  Will get help right away if you are not doing well or get worse.   This information is not intended to replace advice given to you by your health care provider. Make sure you discuss any questions you have with your health care provider.   Document Released: 08/07/2005 Document Revised: 07/19/2015 Document Reviewed: 12/06/2011 Elsevier Interactive Patient Education [employer].

## 2015-10-28 NOTE — ED Notes (Signed)
Bed: [id number] Expected date:  Expected time:  Means of arrival:  Comments: EMS- fall; dementia

## 2015-10-28 NOTE — ED Notes (Addendum)
Pt from [facility] via [facility] . Per [facility] pt had an unwitnessed fall in the restroom. Skin tear noted to the left medial elbow. [facility] has a rolled towel behind neck as they report patient can only tolerate towel not C-collar. Pt alert and oriented per baseline as reported by [facility]. Hx of dementia. No obvious deformity noted. Pt c/o right hip pain;no obvious deformity, shortening or rotation. Pt has mild pain when hip is palpated.

## 2015-10-28 NOTE — ED Provider Notes (Signed)
CSN: [id number]     Arrival date & time 10/28/15  [id number] History   First MD Initiated Contact with Patient 10/28/15 1844     No chief complaint on file.    (Consider location/radiation/quality/duration/timing/severity/associated sxs/prior Treatment) Patient is a 81 y.o. female presenting with fall.  Fall This is a new problem. The current episode started less than 1 hour ago. The problem occurs constantly. The problem has not changed since onset.Pertinent negatives include no chest pain, no abdominal pain (improved from yesterday), no headaches and no shortness of breath. Nothing aggravates the symptoms. Nothing relieves the symptoms. She has tried nothing for the symptoms. The treatment provided no relief.    Past Medical History  Diagnosis Date  . Diverticulosis   . Esophageal stricture   . Hiatal hernia   . Iron deficiency anemia   . Depression   . Anxiety   . Arthritis   . Asthma   . Gastric ulcer 1994  . Partial small bowel obstruction ([place]) 1995  . IBS (irritable bowel syndrome)   . GERD (gastroesophageal reflux disease)   . Osteoporosis   . Insomnia   . Macular degeneration   . Seborrheic keratosis   . Stroke ([place])   . Dementia   . CKD (chronic kidney disease) stage 3, GFR 30-59 ml/min 10/14/2015  . Hypothyroidism   . Hyperlipidemia    Past Surgical History  Procedure Laterality Date  . Cholecystectomy    . Appendectomy    . Tonsillectomy    . Spine surgery    . Bunionectomy    . Sinus surgery with instatrak    . Abdominal hysterectomy     Family History  Problem Relation Age of Onset  . Ovarian cancer Mother   . Heart disease    . Macular degeneration    . Colon cancer Neg Hx    Social History  Substance Use Topics  . Smoking status: Never Smoker   . Smokeless tobacco: Never Used  . Alcohol Use: Yes     Comment: not often    OB History    No data available     Review of Systems  Constitutional: Negative for fever.  HENT: Negative for sore  throat.   Eyes: Negative for visual disturbance.  Respiratory: Negative for cough and shortness of breath.   Cardiovascular: Negative for chest pain.  Gastrointestinal: Negative for nausea, vomiting and abdominal pain (improved from yesterday).  Genitourinary: Negative for difficulty urinating.  Musculoskeletal: Positive for arthralgias. Negative for back pain and neck pain.  Skin: Negative for rash.  Neurological: Negative for syncope and headaches.      Allergies  Iohexol; Milk-related compounds; Nexium; and Penicillins  Home Medications   Prior to Admission medications   Medication Sig Start Date End Date Taking? Authorizing Provider  acetaminophen (TYLENOL) 325 MG tablet Take 650 mg by mouth every 6 (six) hours as needed for moderate pain or fever.   Yes Historical Provider, MD  atorvastatin (LIPITOR) 10 MG tablet Take 10 mg by mouth daily at 6 PM.    Yes Historical Provider, MD  clotrimazole (LOTRIMIN) 1 % cream Apply 1 application topically 2 (two) times daily as needed (rash).  10/20/15  Yes Historical Provider, MD  famotidine (PEPCID) 20 MG tablet Take 1 tablet (20 mg total) by mouth daily. 10/19/15  Yes Lavina Hamman, MD  FLUoxetine (PROZAC) 20 MG capsule Take 20 mg by mouth daily.   Yes Historical Provider, MD  Fluticasone Furoate-Vilanterol 100-25 MCG/INH AEPB Inhale 1  puff into the lungs daily.   Yes Historical Provider, MD  guaifenesin (ROBITUSSIN) 100 MG/5ML syrup Take 100 mg by mouth 4 (four) times daily as needed for cough.   Yes Historical Provider, MD  iron polysaccharides (NIFEREX) 150 MG capsule Take 150 mg by mouth 2 (two) times daily.     Yes Historical Provider, MD  levothyroxine (SYNTHROID, LEVOTHROID) 75 MCG tablet Take 75 mcg by mouth daily before breakfast.   Yes Historical Provider, MD  loratadine (CLARITIN) 10 MG tablet Take 10 mg by mouth daily.   Yes Historical Provider, MD  LORazepam (ATIVAN) 0.5 MG tablet Take 1 tablet (0.5 mg total) by mouth 2 (two)  times daily. 10/20/15  Yes Lavina Hamman, MD  megestrol (MEGACE) 40 MG/ML suspension Take 10 mLs (400 mg total) by mouth daily. 10/28/15  Yes Varney Biles, MD  Memantine HCl ER (NAMENDA XR) 28 MG CP24 Take 28 mg by mouth daily.   Yes Historical Provider, MD  mirtazapine (REMERON) 15 MG tablet Take 15 mg by mouth at bedtime.     Yes Historical Provider, MD  Multiple Vitamins-Minerals (PRESERVISION AREDS 2 PO) Take 1 capsule by mouth 2 (two) times daily.    Yes Historical Provider, MD  PRESCRIPTION MEDICATION Apply 1 application topically as needed (for itching to skin). TRIAMCINOLONE 0.1% AND HYDROCERIN 2:1 CREAM   Yes Historical Provider, MD  tamsulosin (FLOMAX) 0.4 MG CAPS capsule Take 1 capsule (0.4 mg total) by mouth daily after supper. 10/20/15  Yes Lavina Hamman, MD  traMADol (ULTRAM) 50 MG tablet Take 1 tablet (50 mg total) by mouth every 8 (eight) hours as needed (for pain). 10/20/15  Yes Lavina Hamman, MD  triamcinolone cream (KENALOG) 0.5 % Apply 1 application topically 3 (three) times daily as needed (rash). Apply to nose and neck rash as needed   Yes Historical Provider, MD  Vitamin D, Ergocalciferol, (DRISDOL) 50000 UNITS CAPS capsule Take 50,000 Units by mouth every 7 (seven) days.   Yes Historical Provider, MD  ciprofloxacin (CIPRO) 500 MG tablet Take 1 tablet (500 mg total) by mouth 2 (two) times daily. 10/28/15   Varney Biles, MD  saccharomyces boulardii (FLORASTOR) 250 MG capsule Take 1 capsule (250 mg total) by mouth 2 (two) times daily. 10/19/15   Lavina Hamman, MD  sulfamethoxazole-trimethoprim (BACTRIM DS,SEPTRA DS) 800-160 MG tablet Take 1 tablet by mouth 2 (two) times daily. 10/28/15 11/04/15  Ankit Nanavati, MD   BP 132/63 mmHg  Pulse 80  Temp(Src) 99.1 F (37.3 C) (Oral)  Resp 16  SpO2 97% Physical Exam  Constitutional: She is oriented to person, place, and time. She appears well-developed and well-nourished. No distress.  HENT:  Head: Normocephalic and atraumatic.   Eyes: Conjunctivae and EOM are normal.  Neck: Normal range of motion.  Cardiovascular: Normal rate, regular rhythm, normal heart sounds and intact distal pulses.  Exam reveals no gallop and no friction rub.   No murmur heard. Pulmonary/Chest: Effort normal and breath sounds normal. No respiratory distress. She has no wheezes. She has no rales.  Abdominal: Soft. She exhibits no distension. There is no tenderness. There is no guarding.  Musculoskeletal: She exhibits tenderness (right hip). She exhibits no edema.  Neurological: She is alert and oriented to person, place, and time.  Skin: Skin is warm and dry. No rash noted. She is not diaphoretic. No erythema.  Abrasion, small skin tear left elbow  Nursing note and vitals reviewed.   ED Course  Procedures (including critical care  time) Labs Review Labs Reviewed - No data to display  Imaging Review Ct Abdomen Pelvis Wo Contrast  10/28/2015  CLINICAL DATA:  Lower quadrant tenderness. EXAM: CT ABDOMEN AND PELVIS WITHOUT CONTRAST TECHNIQUE: Multidetector CT imaging of the abdomen and pelvis was performed following the standard protocol without IV contrast. COMPARISON:  10/14/2015 FINDINGS: Atelectasis in the lung bases. Large esophageal hiatal hernia containing much of the stomach. Low-attenuation lesion along the diaphragm on the right, measuring about 2.4 cm diameter. This is not change since previous study and probably represents a developmental cystic structure such is a duplication cyst. Cyst centrally in the liver measuring 2.8 cm without change. Surgical absence of the gallbladder. No bile duct dilatation. The unenhanced appearance of the spleen, pancreas, adrenal glands, kidneys, inferior vena cava, and retroperitoneal lymph nodes is unremarkable. Scattered calcification in the aorta without aneurysm. Stomach, small bowel, and colon are not abnormally distended. No bowel wall thickening is appreciated. There is a midline subxiphoid anterior  abdominal wall hernia containing fat and now containing a small amount of small bowel. There is no proximal bowel obstruction. There is infiltration within the herniated fat which may indicate fat necrosis. No free air or free fluid in the abdomen. Pelvis: Uterus and ovaries are not enlarged. Bladder wall is not thickened. No free or loculated pelvic fluid collections. No pelvic mass or lymphadenopathy. The appendix is not identified. Prominent stool-filled rectum. Diverticulosis of the sigmoid colon without evidence of diverticulitis. Degenerative changes in the spine and hips. Nonspecific calcifications demonstrated in the sacrum. Old fracture deformities of the right superior and inferior pubic rami. Slight anterior subluxation of L3 on L4. Superior endplate compression of T10. Bone changes are similar to previous study. IMPRESSION: Large esophageal hiatal hernia containing most of the stomach. Midline anterior abdominal wall hernia containing fat with possible fat necrosis and now containing a small amount of small bowel. No evidence of small bowel obstruction. Cystic structure in the right costophrenic angle probably representing a benign developmental cyst. Electronically Signed   By: Lucienne Capers M.D.   On: 10/28/2015 02:16   Ct Head Wo Contrast  10/28/2015  CLINICAL DATA:  Fall. EXAM: CT HEAD WITHOUT CONTRAST CT CERVICAL SPINE WITHOUT CONTRAST TECHNIQUE: Multidetector CT imaging of the head and cervical spine was performed following the standard protocol without intravenous contrast. Multiplanar CT image reconstructions of the cervical spine were also generated. COMPARISON:  04/12/2015 FINDINGS: CT HEAD FINDINGS Prominence of the sulci and ventricles compatible with brain atrophy. There is low attenuation within the subcortical and periventricular white matter compatible with chronic microvascular disease. No evidence for acute intracranial hemorrhage, acute cortical infarct or mass. No abnormal  extra-axial fluid collections identified. The paranasal sinuses and mastoid air cells are clear. The calvarium appears intact. CT CERVICAL SPINE FINDINGS There is reversal of normal cervical lordosis. The vertebral body heights are well preserved. The facet joints are aligned. The prevertebral soft tissue space appears normal. There is multi level disc space narrowing and ventral endplate spurring. This is most advanced at C5-6, C6-7 and C7-T1. IMPRESSION: 1. No acute intracranial abnormalities. 2. Small vessel ischemic disease and chronic brain atrophy. 3. Cervical spondylosis. 4. No evidence for cervical spine fracture or subluxation. Electronically Signed   By: Kerby Moors M.D.   On: 10/28/2015 20:49   Ct Cervical Spine Wo Contrast  10/28/2015  CLINICAL DATA:  Fall. EXAM: CT HEAD WITHOUT CONTRAST CT CERVICAL SPINE WITHOUT CONTRAST TECHNIQUE: Multidetector CT imaging of the head and cervical spine was  performed following the standard protocol without intravenous contrast. Multiplanar CT image reconstructions of the cervical spine were also generated. COMPARISON:  04/12/2015 FINDINGS: CT HEAD FINDINGS Prominence of the sulci and ventricles compatible with brain atrophy. There is low attenuation within the subcortical and periventricular white matter compatible with chronic microvascular disease. No evidence for acute intracranial hemorrhage, acute cortical infarct or mass. No abnormal extra-axial fluid collections identified. The paranasal sinuses and mastoid air cells are clear. The calvarium appears intact. CT CERVICAL SPINE FINDINGS There is reversal of normal cervical lordosis. The vertebral body heights are well preserved. The facet joints are aligned. The prevertebral soft tissue space appears normal. There is multi level disc space narrowing and ventral endplate spurring. This is most advanced at C5-6, C6-7 and C7-T1. IMPRESSION: 1. No acute intracranial abnormalities. 2. Small vessel ischemic disease  and chronic brain atrophy. 3. Cervical spondylosis. 4. No evidence for cervical spine fracture or subluxation. Electronically Signed   By: Kerby Moors M.D.   On: 10/28/2015 20:49   Dg Abd Acute W/chest  10/27/2015  CLINICAL DATA:  Mid and lower abdominal pain for 1 week, evaluate for obstruction. EXAM: DG ABDOMEN ACUTE W/ 1V CHEST COMPARISON:  Chest and abdominal radiographs 10/16/2015. CT 10/14/2015 FINDINGS: Large hiatal hernia. Cardiomediastinal contours are unchanged. No consolidation. No free intra-abdominal air. No dilated bowel loops to suggest obstruction. Moderate stool throughout the colon with enteric contrast within multiple distal colonic diverticula from prior CT. A stool ball distends the rectum. Surgical clips in the right upper quadrant the abdomen from cholecystectomy. Remote right pelvis fracture again seen. Bones under mineralized. IMPRESSION: 1. No evidence of bowel obstruction. Moderate stool burden with stool ball in the rectum. Diverticulosis with retained enteric contrast within colonic diverticula. 2. Hiatal hernia. Electronically Signed   By: Jeb Levering M.D.   On: 10/27/2015 23:48   Dg Hip Unilat  With Pelvis 2-3 Views Right  10/28/2015  CLINICAL DATA:  Fall.  Tail bone and groin pain. EXAM: DG HIP (WITH OR WITHOUT PELVIS) 2-3V RIGHT COMPARISON:  07/12/2015 FINDINGS: Chronic deformity involving the right superior and inferior pubic rami identified. Gas and stool overlie the pubic symphysis. There is no evidence of acute hip fracture or dislocation. There is no evidence of arthropathy or other focal bone abnormality. IMPRESSION: 1. No evidence for acute fracture or dislocation. If there is high clinical suspicion for occult fracture or the patient refuses to weightbear, consider further evaluation with MRI. Although CT is expeditious, evidence is lacking regarding accuracy of CT over plain film radiography. 2. Chronic deformity involves the right superior and inferior pubic  rami. Likely posttraumatic. Electronically Signed   By: Kerby Moors M.D.   On: 10/28/2015 18:59   I have personally reviewed and evaluated these images and lab results as part of my medical decision-making.   EKG Interpretation None      MDM   Final diagnoses:  Fall, initial encounter   81 yo female with history of dementia, CKD, hyperlipidemia, hypothyroidism, recent ED visit for abdominal pain diagnosed with UTI presents with concern for fall. Patient reports she tripped and fell, is unsure whether she hit her head.  Given age, unclear hx of head trauma, head and C Spine CT ordered which showed no acute abnormalities. Patient reports right hip pain and XR was performed which shows no sign of fracture. Patient able to weight bear and do not feel further imaging indicated at this time. Patient otherwise unchanged from baseline, hemodynamically stable, afebrile, appropriate  for continued outpt treatment of UTI as rx yesterday.     Gareth Morgan, MD 10/29/15 1112

## 2015-11-09 ENCOUNTER — Inpatient Hospital Stay (HOSPITAL_COMMUNITY): Payer: Medicare Other

## 2015-11-09 ENCOUNTER — Emergency Department (HOSPITAL_COMMUNITY): Payer: Medicare Other

## 2015-11-09 ENCOUNTER — Encounter (HOSPITAL_COMMUNITY): Payer: Self-pay

## 2015-11-09 ENCOUNTER — Inpatient Hospital Stay (HOSPITAL_COMMUNITY)
Admission: EM | Admit: 2015-11-09 | Discharge: 2015-11-13 | DRG: 070 | Disposition: A | Discharge status: Skilled Nursing Facility | Payer: Medicare Other | Attending: Internal Medicine | Admitting: Internal Medicine

## 2015-11-09 ENCOUNTER — Observation Stay (HOSPITAL_COMMUNITY): Payer: Medicare Other

## 2015-11-09 DIAGNOSIS — Z91041 Radiographic dye allergy status: Secondary | ICD-10-CM

## 2015-11-09 DIAGNOSIS — N390 Urinary tract infection, site not specified: Secondary | ICD-10-CM | POA: Diagnosis present

## 2015-11-09 DIAGNOSIS — M4854XA Collapsed vertebra, not elsewhere classified, thoracic region, initial encounter for fracture: Secondary | ICD-10-CM | POA: Diagnosis present

## 2015-11-09 DIAGNOSIS — Z91011 Allergy to milk products: Secondary | ICD-10-CM | POA: Diagnosis not present

## 2015-11-09 DIAGNOSIS — I6340 Cerebral infarction due to embolism of unspecified cerebral artery: Secondary | ICD-10-CM | POA: Diagnosis not present

## 2015-11-09 DIAGNOSIS — R339 Retention of urine, unspecified: Secondary | ICD-10-CM | POA: Diagnosis present

## 2015-11-09 DIAGNOSIS — I6789 Other cerebrovascular disease: Secondary | ICD-10-CM | POA: Diagnosis not present

## 2015-11-09 DIAGNOSIS — J45909 Unspecified asthma, uncomplicated: Secondary | ICD-10-CM | POA: Diagnosis present

## 2015-11-09 DIAGNOSIS — F419 Anxiety disorder, unspecified: Secondary | ICD-10-CM | POA: Diagnosis present

## 2015-11-09 DIAGNOSIS — F0390 Unspecified dementia without behavioral disturbance: Secondary | ICD-10-CM | POA: Diagnosis not present

## 2015-11-09 DIAGNOSIS — Z888 Allergy status to other drugs, medicaments and biological substances status: Secondary | ICD-10-CM

## 2015-11-09 DIAGNOSIS — R4182 Altered mental status, unspecified: Secondary | ICD-10-CM

## 2015-11-09 DIAGNOSIS — Z6820 Body mass index (BMI) 20.0-20.9, adult: Secondary | ICD-10-CM | POA: Diagnosis not present

## 2015-11-09 DIAGNOSIS — Z8619 Personal history of other infectious and parasitic diseases: Secondary | ICD-10-CM

## 2015-11-09 DIAGNOSIS — K219 Gastro-esophageal reflux disease without esophagitis: Secondary | ICD-10-CM | POA: Diagnosis present

## 2015-11-09 DIAGNOSIS — E860 Dehydration: Secondary | ICD-10-CM

## 2015-11-09 DIAGNOSIS — N183 Chronic kidney disease, stage 3 unspecified: Secondary | ICD-10-CM | POA: Diagnosis present

## 2015-11-09 DIAGNOSIS — M810 Age-related osteoporosis without current pathological fracture: Secondary | ICD-10-CM | POA: Diagnosis present

## 2015-11-09 DIAGNOSIS — Z880 Allergy status to penicillin: Secondary | ICD-10-CM | POA: Diagnosis not present

## 2015-11-09 DIAGNOSIS — D509 Iron deficiency anemia, unspecified: Secondary | ICD-10-CM | POA: Diagnosis present

## 2015-11-09 DIAGNOSIS — I639 Cerebral infarction, unspecified: Secondary | ICD-10-CM

## 2015-11-09 DIAGNOSIS — R29810 Facial weakness: Secondary | ICD-10-CM | POA: Diagnosis present

## 2015-11-09 DIAGNOSIS — K449 Diaphragmatic hernia without obstruction or gangrene: Secondary | ICD-10-CM | POA: Diagnosis present

## 2015-11-09 DIAGNOSIS — E46 Unspecified protein-calorie malnutrition: Secondary | ICD-10-CM | POA: Diagnosis present

## 2015-11-09 DIAGNOSIS — Z8719 Personal history of other diseases of the digestive system: Secondary | ICD-10-CM | POA: Diagnosis not present

## 2015-11-09 DIAGNOSIS — E785 Hyperlipidemia, unspecified: Secondary | ICD-10-CM | POA: Diagnosis present

## 2015-11-09 DIAGNOSIS — E039 Hypothyroidism, unspecified: Secondary | ICD-10-CM | POA: Diagnosis present

## 2015-11-09 DIAGNOSIS — R269 Unspecified abnormalities of gait and mobility: Secondary | ICD-10-CM | POA: Diagnosis present

## 2015-11-09 DIAGNOSIS — G9341 Metabolic encephalopathy: Secondary | ICD-10-CM | POA: Diagnosis not present

## 2015-11-09 DIAGNOSIS — Z8673 Personal history of transient ischemic attack (TIA), and cerebral infarction without residual deficits: Secondary | ICD-10-CM | POA: Diagnosis not present

## 2015-11-09 DIAGNOSIS — D72829 Elevated white blood cell count, unspecified: Secondary | ICD-10-CM | POA: Diagnosis present

## 2015-11-09 DIAGNOSIS — D649 Anemia, unspecified: Secondary | ICD-10-CM | POA: Diagnosis present

## 2015-11-09 DIAGNOSIS — H3530 Unspecified macular degeneration: Secondary | ICD-10-CM | POA: Diagnosis present

## 2015-11-09 DIAGNOSIS — F329 Major depressive disorder, single episode, unspecified: Secondary | ICD-10-CM | POA: Diagnosis present

## 2015-11-09 LAB — CBC WITH DIFFERENTIAL/PLATELET
BASOS PCT: 0 %
Basophils Absolute: 0 10*3/uL (ref 0.0–0.1)
EOS ABS: 0.3 10*3/uL (ref 0.0–0.7)
Eosinophils Relative: 2 %
HEMATOCRIT: 35.3 % — AB (ref 36.0–46.0)
Hemoglobin: 11.4 g/dL — ABNORMAL LOW (ref 12.0–15.0)
LYMPHS ABS: 0.8 10*3/uL (ref 0.7–4.0)
Lymphocytes Relative: 6 %
MCH: 31.6 pg (ref 26.0–34.0)
MCHC: 32.3 g/dL (ref 30.0–36.0)
MCV: 97.8 fL (ref 78.0–100.0)
MONO ABS: 0.9 10*3/uL (ref 0.1–1.0)
MONOS PCT: 6 %
Neutro Abs: 12.9 10*3/uL — ABNORMAL HIGH (ref 1.7–7.7)
Neutrophils Relative %: 86 %
Platelets: 171 10*3/uL (ref 150–400)
RBC: 3.61 MIL/uL — ABNORMAL LOW (ref 3.87–5.11)
RDW: 13.9 % (ref 11.5–15.5)
WBC: 15 10*3/uL — ABNORMAL HIGH (ref 4.0–10.5)

## 2015-11-09 LAB — COMPREHENSIVE METABOLIC PANEL
ALBUMIN: 3.3 g/dL — AB (ref 3.5–5.0)
ALK PHOS: 92 U/L (ref 38–126)
ALT: 20 U/L (ref 14–54)
AST: 24 U/L (ref 15–41)
Anion gap: 13 (ref 5–15)
BUN: 23 mg/dL — AB (ref 6–20)
CALCIUM: 8.9 mg/dL (ref 8.9–10.3)
CO2: 22 mmol/L (ref 22–32)
CREATININE: 1.38 mg/dL — AB (ref 0.44–1.00)
Chloride: 108 mmol/L (ref 101–111)
GFR calc Af Amer: 39 mL/min — ABNORMAL LOW (ref >60)
GFR calc non Af Amer: 34 mL/min — ABNORMAL LOW (ref >60)
GLUCOSE: 122 mg/dL — AB (ref 65–99)
Potassium: 3.9 mmol/L (ref 3.5–5.1)
SODIUM: 143 mmol/L (ref 135–145)
Total Bilirubin: 0.3 mg/dL (ref 0.3–1.2)
Total Protein: 5.9 g/dL — ABNORMAL LOW (ref 6.5–8.1)

## 2015-11-09 LAB — URINALYSIS, ROUTINE W REFLEX MICROSCOPIC
Bilirubin Urine: NEGATIVE
GLUCOSE, UA: NEGATIVE mg/dL
HGB URINE DIPSTICK: NEGATIVE
Ketones, ur: 15 mg/dL — AB
Nitrite: NEGATIVE
PH: 8.5 — AB (ref 5.0–8.0)
PROTEIN: 100 mg/dL — AB
Specific Gravity, Urine: 1.019 (ref 1.005–1.030)

## 2015-11-09 LAB — URINE MICROSCOPIC-ADD ON: RBC / HPF: NONE SEEN RBC/hpf (ref 0–5)

## 2015-11-09 LAB — I-STAT TROPONIN, ED: TROPONIN I, POC: 0.05 ng/mL (ref 0.00–0.08)

## 2015-11-09 MED ORDER — SODIUM CHLORIDE 0.9 % IJ SOLN
3.0000 mL | Freq: Two times a day (BID) | INTRAMUSCULAR | Status: DC
Start: 2015-11-09 — End: 2015-11-13
  Administered 2015-11-09 – 2015-11-13 (×7): 3 mL via INTRAVENOUS

## 2015-11-09 MED ORDER — ONDANSETRON HCL 4 MG/2ML IJ SOLN
4.0000 mg | Freq: Four times a day (QID) | INTRAMUSCULAR | Status: DC | PRN
Start: 2015-11-09 — End: 2015-11-13

## 2015-11-09 MED ORDER — LEVOFLOXACIN IN D5W 500 MG/100ML IV SOLN
500.0000 mg | INTRAVENOUS | Status: DC
  Administered 2015-11-09: 500 mg via INTRAVENOUS
  Filled 2015-11-09: qty 100

## 2015-11-09 MED ORDER — ASPIRIN 300 MG RE SUPP: 300.0000 mg | Freq: Every day | RECTAL | Status: DC

## 2015-11-09 MED ORDER — ASPIRIN 325 MG PO TABS
325.0000 mg | ORAL_TABLET | Freq: Every day | ORAL | Status: DC
  Administered 2015-11-10 – 2015-11-13 (×4): 325 mg via ORAL
  Filled 2015-11-09 (×4): qty 1

## 2015-11-09 MED ORDER — SODIUM CHLORIDE 0.9 % IV BOLUS (SEPSIS)
1000.0000 mL | Freq: Once | INTRAVENOUS | Status: AC
  Administered 2015-11-09: 1000 mL via INTRAVENOUS

## 2015-11-09 MED ORDER — ONDANSETRON HCL 4 MG PO TABS: 4.0000 mg | ORAL_TABLET | Freq: Four times a day (QID) | ORAL | Status: DC | PRN

## 2015-11-09 MED ORDER — STROKE: EARLY STAGES OF RECOVERY BOOK
Freq: Once | Status: DC
  Filled 2015-11-09: qty 1

## 2015-11-09 MED ORDER — SODIUM CHLORIDE 0.9 % IV SOLN
INTRAVENOUS | Status: DC
  Administered 2015-11-09: 19:00:00 via INTRAVENOUS

## 2015-11-09 MED ORDER — ENOXAPARIN SODIUM 30 MG/0.3ML ~~LOC~~ SOLN
30.0000 mg | SUBCUTANEOUS | Status: DC
Start: 2015-11-09 — End: 2015-11-13
  Administered 2015-11-09 – 2015-11-12 (×4): 30 mg via SUBCUTANEOUS
  Filled 2015-11-09 (×4): qty 0.3

## 2015-11-09 MED ORDER — ACETAMINOPHEN 325 MG PO TABS: 650.0000 mg | ORAL_TABLET | Freq: Four times a day (QID) | ORAL | Status: DC | PRN

## 2015-11-09 MED ORDER — ACETAMINOPHEN 650 MG RE SUPP: 650.0000 mg | Freq: Four times a day (QID) | RECTAL | Status: DC | PRN

## 2015-11-09 NOTE — Progress Notes (Addendum)
Acute embolic CVA -MRI brain revealed approximately 12 acute microembolic infarction scattered throughout the brain consistent with embolic disease from the heart or ascending aorta but no large confluent infarction and no mass effect or hemorrhage -We'll consult neurology-I spoke with Dr. Nicole Kindred at 54 -Begin appropriate stroke workup and initiate stroke orders  Erin Hearing, ANP

## 2015-11-09 NOTE — H&P (Addendum)
[facility] History and Physical                                                                                    Melanie Levy, is a 81 y.o. female  MRN: [id number]   DOB - 20-Apr-1928  Admit Date - 11/09/2015  Outpatient Primary MD for the patient is Donnajean Lopes, MD  Referring MD: Rex Kras / ER  PMH: Past Medical History  Diagnosis Date  . Diverticulosis   . Esophageal stricture   . Hiatal hernia   . Iron deficiency anemia   . Depression   . Anxiety   . Arthritis   . Asthma   . Gastric ulcer 1994  . Partial small bowel obstruction ([place]) 1995  . IBS (irritable bowel syndrome)   . GERD (gastroesophageal reflux disease)   . Osteoporosis   . Insomnia   . Macular degeneration   . Seborrheic keratosis   . Stroke ([place])   . Dementia   . CKD (chronic kidney disease) stage 3, GFR 30-59 ml/min 10/14/2015  . Hypothyroidism   . Hyperlipidemia       PSH: Past Surgical History  Procedure Laterality Date  . Cholecystectomy    . Appendectomy    . Tonsillectomy    . Spine surgery    . Bunionectomy    . Sinus surgery with instatrak    . Abdominal hysterectomy       CC: Altered mentation    HPI: This is an 81 year old female patient recently discharged from this facility on 12/9 after being admitted for bacterial enteritis. During that hospitalization patient had urinary retention and was evaluated by urology and discharged to the skilled nursing facility with a Foley catheter in place with plans to follow-up w/ urology after discharge. She has underlying history of iron deficiency anemia, gait instability, depression and anxiety, chronic kidney disease stage III, dementia, GERD, hypothyroidism. Since discharge patient has presented to the ER for a total of 3 evaluations (excluding today's visit). She presented on 12/12 after a fall, on 12/16 with reports of abdominal pain and again on 12/17 after a fall. All workup at each visit was unremarkable and patient was  discharged back to the skilled nursing facility. Today she was sent from the nursing facility because of altered mentation and extreme lethargy and question of right facial droop. Last seen normal unknown. Patient typically is able to feed herself but had to be fed today according to nursing staff. She was also running low-grade fevers. Patient unable to adequately answer history accurately and no family at bedside during EDP exam or during my evaluation. Medication reconciliation sheet from nursing facility shows patient is a full code.  ER Evaluation and treatment: Low-grade temperature 99.1, hemodynamically stable and non-tachycardic and not hypoxemic. Two-view chest x-ray unremarkable except for compression fracture at T10 level new since 2014 but unchanged since last x-ray on 12/16-year seen today because of lateral views CT of the head without contrast without acute intracranial abnormalities EKG without ischemic changes BUN and creatinine stable and at baseline Glucose 122 Albumin 3.3 with total protein 5.9 Troponin 0.05 White count elevated at 15,000, neutrophils 86% and absolute neutrophils 12.9%  Hemoglobin is stable at 11.4 Urinalysis abnormal with turbid appearance rare bacteria red color triple crystals 5 ketones small leukocytes alkaline pH of 8.5 protein 100 WBCs 0-5 with urine culture pending 1 L normal saline bolus IV 1   Review of Systems   Unobtainable from patient given underlying dementia and current altered mental state and lethargy  Social History Social History  Substance Use Topics  . Smoking status: Never Smoker   . Smokeless tobacco: Never Used  . Alcohol Use: Yes     Comment: not often     Resides at: [facility]  Lives with: N/A  Ambulatory status: Wheelchair   Family History Family History  Problem Relation Age of Onset  . Ovarian cancer Mother   . Heart disease    . Macular degeneration    . Colon cancer Neg Hx       Prior to Admission medications   Medication Sig Start Date End Date Taking? Authorizing Provider  acetaminophen (TYLENOL) 325 MG tablet Take 650 mg by mouth every 6 (six) hours as needed for moderate pain or fever.   Yes Historical Provider, MD  atorvastatin (LIPITOR) 10 MG tablet Take 10 mg by mouth daily at 6 PM.    Yes Historical Provider, MD  clotrimazole (LOTRIMIN) 1 % cream Apply 1 application topically 2 (two) times daily as needed (rash).  10/20/15  Yes Historical Provider, MD  famotidine (PEPCID) 20 MG tablet Take 1 tablet (20 mg total) by mouth daily. 10/19/15  Yes Lavina Hamman, MD  FLUoxetine (PROZAC) 20 MG capsule Take 20 mg by mouth daily.   Yes Historical Provider, MD  Fluticasone Furoate-Vilanterol (BREO ELLIPTA) 100-25 MCG/INH AEPB Inhale 1 puff into the lungs daily.   Yes Historical Provider, MD  guaifenesin (ROBITUSSIN) 100 MG/5ML syrup Take 100 mg by mouth 4 (four) times daily as needed for cough.   Yes Historical Provider, MD  iron polysaccharides (NIFEREX) 150 MG capsule Take 150 mg by mouth 2 (two) times daily.     Yes Historical Provider, MD  levothyroxine (SYNTHROID, LEVOTHROID) 75 MCG tablet Take 75 mcg by mouth daily before breakfast.   Yes Historical Provider, MD  loratadine (CLARITIN) 10 MG tablet Take 10 mg by mouth daily.   Yes Historical Provider, MD  LORazepam (ATIVAN) 0.5 MG tablet Take 1 tablet (0.5 mg total) by mouth 2 (two) times daily. 10/20/15  Yes Lavina Hamman, MD  megestrol (MEGACE) 40 MG/ML suspension Take 10 mLs (400 mg total) by mouth daily. 10/28/15  Yes Varney Biles, MD  Memantine HCl ER (NAMENDA XR) 28 MG CP24 Take 28 mg by mouth daily.   Yes Historical Provider, MD  mirtazapine (REMERON) 15 MG tablet Take 15 mg by mouth at bedtime.     Yes Historical Provider, MD  Multiple Vitamins-Minerals (PRESERVISION AREDS 2 PO) Take 1 capsule by mouth 2 (two) times daily.    Yes Historical Provider, MD  nitrofurantoin, macrocrystal-monohydrate,  (MACROBID) 100 MG capsule Take 100 mg by mouth 2 (two) times daily.   Yes Historical Provider, MD  PRESCRIPTION MEDICATION Apply 1 application topically as needed (for itching to skin). TRIAMCINOLONE 0.1% AND HYDROCERIN 2:1 CREAM   Yes Historical Provider, MD  tamsulosin (FLOMAX) 0.4 MG CAPS capsule Take 1 capsule (0.4 mg total) by mouth daily after supper. 10/20/15  Yes Lavina Hamman, MD  traMADol (ULTRAM) 50 MG tablet Take 1 tablet (50 mg total) by mouth every 8 (eight) hours as needed (for pain). 10/20/15  Yes  Lavina Hamman, MD  triamcinolone cream (KENALOG) 0.5 % Apply 1 application topically 3 (three) times daily as needed (rash). Apply to nose and neck rash as needed   Yes Historical Provider, MD  Vitamin D, Ergocalciferol, (DRISDOL) 50000 UNITS CAPS capsule Take 50,000 Units by mouth every 7 (seven) days.   Yes Historical Provider, MD    Allergies  Allergen Reactions  . Iohexol      Desc: Pt states that years ago she had a procedure that involved IV contrast and had throat swelling and sob. She was told never to have IV contrast again.  Pt is a very poor historian and cannot remember the date or the procedure.   . Milk-Related Compounds Nausea And Vomiting  . Nexium [Esomeprazole Magnesium] Other (See Comments)    Per MAR  . Penicillins Swelling    Physical Exam  Vitals  Blood pressure 126/50, pulse 84, temperature 99.1 F (37.3 C), temperature source Oral, resp. rate 21, SpO2 99 %.   General:  In moderate acute distress as evidenced by ongoing lethargy and confusion  Psych:  Eventually utilizing voice and tactile stimulation was able to get patient to awaken, limited verbal interaction and minimal eye contact and remained quite sleepy, confused and oriented times name only  Neuro:   No focal neurological deficits appreciated, CN II through XII intact and currently do not appreciate apparent facial drooping previously reported, Strength unable to be adequately tested given  patient's inability to follow commands consistently, Sensation intact all 4 extremities.  ENT:  Ears and Eyes appear Normal, Conjunctivae clear, PER. Very dry oral mucosa without erythema or exudates.  Neck:  Supple, No lymphadenopathy appreciated  Respiratory:  Symmetrical chest wall movement, Good air movement bilaterally posteriorly, CTAB. Room Air  Cardiac:  RRR, No Murmurs, no LE edema noted, no JVD, No carotid bruits, peripheral pulses palpable at 2+  Abdomen:  Positive bowel sounds, Soft, Non tender to palpation, Non distended,  No masses appreciated, no obvious hepatosplenomegaly  Genitourinary: Foley catheter in place with very dark amber urine noted in bedside bag  Skin:  No Cyanosis, Normal Skin Turgor, No Skin Rash or Bruise.  Extremities: Symmetrical without obvious trauma or injury,  no effusions.  Data Review  CBC  Recent Labs Lab 11/09/15 1446  WBC 15.0*  HGB 11.4*  HCT 35.3*  PLT 171  MCV 97.8  MCH 31.6  MCHC 32.3  RDW 13.9  LYMPHSABS 0.8  MONOABS 0.9  EOSABS 0.3  BASOSABS 0.0    Chemistries   Recent Labs Lab 11/09/15 1446  NA 143  K 3.9  CL 108  CO2 22  GLUCOSE 122*  BUN 23*  CREATININE 1.38*  CALCIUM 8.9  AST 24  ALT 20  ALKPHOS 92  BILITOT 0.3    CrCl cannot be calculated (Unknown ideal weight.).  No results for input(s): TSH, T4TOTAL, T3FREE, THYROIDAB in the last 72 hours.  Invalid input(s): FREET3  Coagulation profile No results for input(s): INR, PROTIME in the last 168 hours.  No results for input(s): DDIMER in the last 72 hours.  Cardiac Enzymes No results for input(s): CKMB, TROPONINI, MYOGLOBIN in the last 168 hours.  Invalid input(s): CK  Invalid input(s): POCBNP  Urinalysis    Component Value Date/Time   COLORURINE RED* 11/09/2015 1447   APPEARANCEUR TURBID* 11/09/2015 1447   LABSPEC 1.019 11/09/2015 1447   PHURINE 8.5* 11/09/2015 1447   GLUCOSEU NEGATIVE 11/09/2015 1447   HGBUR NEGATIVE 11/09/2015 1447    BILIRUBINUR NEGATIVE  11/09/2015 1447   KETONESUR 15* 11/09/2015 1447   PROTEINUR 100* 11/09/2015 1447   UROBILINOGEN 0.2 10/13/2013 0546   NITRITE NEGATIVE 11/09/2015 1447   LEUKOCYTESUR SMALL* 11/09/2015 1447    Imaging results:   Ct Abdomen Pelvis Wo Contrast  10/28/2015  CLINICAL DATA:  Lower quadrant tenderness. EXAM: CT ABDOMEN AND PELVIS WITHOUT CONTRAST TECHNIQUE: Multidetector CT imaging of the abdomen and pelvis was performed following the standard protocol without IV contrast. COMPARISON:  10/14/2015 FINDINGS: Atelectasis in the lung bases. Large esophageal hiatal hernia containing much of the stomach. Low-attenuation lesion along the diaphragm on the right, measuring about 2.4 cm diameter. This is not change since previous study and probably represents a developmental cystic structure such is a duplication cyst. Cyst centrally in the liver measuring 2.8 cm without change. Surgical absence of the gallbladder. No bile duct dilatation. The unenhanced appearance of the spleen, pancreas, adrenal glands, kidneys, inferior vena cava, and retroperitoneal lymph nodes is unremarkable. Scattered calcification in the aorta without aneurysm. Stomach, small bowel, and colon are not abnormally distended. No bowel wall thickening is appreciated. There is a midline subxiphoid anterior abdominal wall hernia containing fat and now containing a small amount of small bowel. There is no proximal bowel obstruction. There is infiltration within the herniated fat which may indicate fat necrosis. No free air or free fluid in the abdomen. Pelvis: Uterus and ovaries are not enlarged. Bladder wall is not thickened. No free or loculated pelvic fluid collections. No pelvic mass or lymphadenopathy. The appendix is not identified. Prominent stool-filled rectum. Diverticulosis of the sigmoid colon without evidence of diverticulitis. Degenerative changes in the spine and hips. Nonspecific calcifications demonstrated in the  sacrum. Old fracture deformities of the right superior and inferior pubic rami. Slight anterior subluxation of L3 on L4. Superior endplate compression of T10. Bone changes are similar to previous study. IMPRESSION: Large esophageal hiatal hernia containing most of the stomach. Midline anterior abdominal wall hernia containing fat with possible fat necrosis and now containing a small amount of small bowel. No evidence of small bowel obstruction. Cystic structure in the right costophrenic angle probably representing a benign developmental cyst. Electronically Signed   By: Lucienne Capers M.D.   On: 10/28/2015 02:16   Ct Abdomen Pelvis Wo Contrast  10/14/2015  CLINICAL DATA:  Abdominal pain with soft tissue abdominal mass. History of dementia and anemia. EXAM: CT ABDOMEN AND PELVIS WITHOUT CONTRAST TECHNIQUE: Multidetector CT imaging of the abdomen and pelvis was performed following the standard protocol without IV contrast. COMPARISON:  Abdominal CT 07/29/2003.  Pelvic CT 10/13/2013. FINDINGS: Lower chest: The lung bases are clear. There is no significant pleural or pericardial effusion. There is a large hiatal hernia which has enlarged compared with the prior study. 3 cm low-density lesion along the right hemidiaphragm is unchanged, likely an incidental diaphragmatic or bronchogenic cyst. Hepatobiliary: There is a 2.8 cm cyst in the left hepatic lobe on image number 18 which has enlarged. No suspicious hepatic findings. Mild extrahepatic biliary prominence status post cholecystectomy, within physiologic limits. Pancreas: Atrophied without focal abnormality or surrounding inflammation. Spleen: Normal in size without focal abnormality. Adrenals/Urinary Tract: Both adrenal glands appear normal. Both kidneys appear normal without evidence of hydronephrosis, focal mass lesion or perinephric soft tissue stranding. No evidence of urinary tract calculus. The bladder is mildly distended without apparent focal abnormality.  Stomach/Bowel: As above, large hiatal hernia. There is a long segment of abnormal circumferential small bowel wall thickening in the mid and right abdomen.  The distal small bowel appears normal. There is no high-grade bowel obstruction. There is edema throughout the mesenteric fat. Mild diverticular changes are present within the distal colon. No definite extraluminal air or fluid collections are identified, although there is an atypical collection of gas in the mid abdomen (image 42) which appears to be within the small bowel lumen based on the reformatted images. Vascular/Lymphatic: There are no enlarged abdominal or pelvic lymph nodes. Mild atherosclerosis of the aorta, its branches and the iliac arteries. Vascular assessment limited without contrast. Reproductive: Unremarkable. Other: There is a midline supraumbilical hernia containing only fat, measuring up to 6.1 cm in diameter. No herniated bowel or soft tissue mass identified. Musculoskeletal: No acute or significant osseous findings. There are posttraumatic deformities of the right pubic rami. There are degenerative changes throughout the lumbar spine associate with a convex right scoliosis and grade 1 anterolisthesis at L3-4. Superior endplate compression deformity at T10 does not appear acute. IMPRESSION: 1. Abnormal long segment mid small bowel wall thickening with surrounding mesenteric edema suspicious for ischemia or bowel hemorrhage. No high-grade obstruction or signs of perforation. 2. Patient's palpable concern may correspond with a supraumbilical hernia containing only fat. There is no herniated bowel. 3. Large hiatal hernia. 4. These results were called by telephone at the time of interpretation on 10/14/2015 at 2:03 pm to Dr. Shirlyn Goltz , who verbally acknowledged these results. Electronically Signed   By: Richardean Sale M.D.   On: 10/14/2015 14:05   Dg Chest 2 View  11/09/2015  CLINICAL DATA:  Altered mental status. Right facial droop.  Weakness. EXAM: CHEST  2 VIEW COMPARISON:  Chest x-rays dated 10/27/2015 and 08/24/2013 and CT scan of the chest dated 03/19/2006 FINDINGS: Heart size and pulmonary vascularity are normal and the lungs are clear. Large hiatal hernia. Stable cyst on the lateral aspect of the right hemidiaphragm, unchanged since 2007. There is a compression fracture of the superior aspect of T10, new since 08/24/2013. IMPRESSION: Compression fracture of T10, new since 2014. It is unchanged since 10/27/2015 no better seen on today's study because of the lateral view. Chronic large hiatal hernia. Stable cyst on the right hemidiaphragm. Electronically Signed   By: Lorriane Shire M.D.   On: 11/09/2015 15:09   Ct Head Wo Contrast  11/09/2015  CLINICAL DATA:  Altered mental status, lethargy today, RIGHT-side facial droop, leaning to RIGHT side, weakness, unknown when last seen normal EXAM: CT HEAD WITHOUT CONTRAST TECHNIQUE: Contiguous axial images were obtained from the base of the skull through the vertex without intravenous contrast. COMPARISON:  10/28/2015 FINDINGS: Asymmetric positioning in gantry. Generalized atrophy with ex vacuo dilatation of the ventricular system. No midline shift or mass effect. Minimal small vessel chronic ischemic changes of deep cerebral white matter. No intracranial hemorrhage, mass lesion or evidence acute infarction. Benign-appearing basal ganglia calcifications again identified. No extra-axial fluid collections. Question small mucosal retention cyst within the RIGHT maxillary sinus. Remaining bones and sinuses unremarkable. IMPRESSION: Atrophy with minimal small vessel chronic ischemic changes of deep cerebral white matter. No acute intracranial abnormalities. Electronically Signed   By: Lavonia Dana M.D.   On: 11/09/2015 15:17   Ct Head Wo Contrast  10/28/2015  CLINICAL DATA:  Fall. EXAM: CT HEAD WITHOUT CONTRAST CT CERVICAL SPINE WITHOUT CONTRAST TECHNIQUE: Multidetector CT imaging of the head and  cervical spine was performed following the standard protocol without intravenous contrast. Multiplanar CT image reconstructions of the cervical spine were also generated. COMPARISON:  04/12/2015 FINDINGS: CT HEAD  FINDINGS Prominence of the sulci and ventricles compatible with brain atrophy. There is low attenuation within the subcortical and periventricular white matter compatible with chronic microvascular disease. No evidence for acute intracranial hemorrhage, acute cortical infarct or mass. No abnormal extra-axial fluid collections identified. The paranasal sinuses and mastoid air cells are clear. The calvarium appears intact. CT CERVICAL SPINE FINDINGS There is reversal of normal cervical lordosis. The vertebral body heights are well preserved. The facet joints are aligned. The prevertebral soft tissue space appears normal. There is multi level disc space narrowing and ventral endplate spurring. This is most advanced at C5-6, C6-7 and C7-T1. IMPRESSION: 1. No acute intracranial abnormalities. 2. Small vessel ischemic disease and chronic brain atrophy. 3. Cervical spondylosis. 4. No evidence for cervical spine fracture or subluxation. Electronically Signed   By: Kerby Moors M.D.   On: 10/28/2015 20:49   Ct Cervical Spine Wo Contrast  10/28/2015  CLINICAL DATA:  Fall. EXAM: CT HEAD WITHOUT CONTRAST CT CERVICAL SPINE WITHOUT CONTRAST TECHNIQUE: Multidetector CT imaging of the head and cervical spine was performed following the standard protocol without intravenous contrast. Multiplanar CT image reconstructions of the cervical spine were also generated. COMPARISON:  04/12/2015 FINDINGS: CT HEAD FINDINGS Prominence of the sulci and ventricles compatible with brain atrophy. There is low attenuation within the subcortical and periventricular white matter compatible with chronic microvascular disease. No evidence for acute intracranial hemorrhage, acute cortical infarct or mass. No abnormal extra-axial fluid  collections identified. The paranasal sinuses and mastoid air cells are clear. The calvarium appears intact. CT CERVICAL SPINE FINDINGS There is reversal of normal cervical lordosis. The vertebral body heights are well preserved. The facet joints are aligned. The prevertebral soft tissue space appears normal. There is multi level disc space narrowing and ventral endplate spurring. This is most advanced at C5-6, C6-7 and C7-T1. IMPRESSION: 1. No acute intracranial abnormalities. 2. Small vessel ischemic disease and chronic brain atrophy. 3. Cervical spondylosis. 4. No evidence for cervical spine fracture or subluxation. Electronically Signed   By: Kerby Moors M.D.   On: 10/28/2015 20:49   Dg Abd Acute W/chest  10/27/2015  CLINICAL DATA:  Mid and lower abdominal pain for 1 week, evaluate for obstruction. EXAM: DG ABDOMEN ACUTE W/ 1V CHEST COMPARISON:  Chest and abdominal radiographs 10/16/2015. CT 10/14/2015 FINDINGS: Large hiatal hernia. Cardiomediastinal contours are unchanged. No consolidation. No free intra-abdominal air. No dilated bowel loops to suggest obstruction. Moderate stool throughout the colon with enteric contrast within multiple distal colonic diverticula from prior CT. A stool ball distends the rectum. Surgical clips in the right upper quadrant the abdomen from cholecystectomy. Remote right pelvis fracture again seen. Bones under mineralized. IMPRESSION: 1. No evidence of bowel obstruction. Moderate stool burden with stool ball in the rectum. Diverticulosis with retained enteric contrast within colonic diverticula. 2. Hiatal hernia. Electronically Signed   By: Jeb Levering M.D.   On: 10/27/2015 23:48   Dg Abd Acute W/chest  10/16/2015  CLINICAL DATA:  Right lower quadrant pain.  Nausea EXAM: DG ABDOMEN ACUTE W/ 1V CHEST COMPARISON:  08/24/2013 chest x-ray FINDINGS: Previously administered oral contrast has reached the nondilated colon, showing multiple colonic diverticula. There is no  indication of small bowel obstruction. No evidence of pneumatosis or perforation. No cardiomegaly. Lower mediastinal widening from large hiatal hernia. There is no edema, consolidation, effusion, or pneumothorax. Remote right obturator ring fracture with pelvic asymmetry from displacement. IMPRESSION: 1. Nonobstructive bowel gas pattern. 2. No evidence of acute cardiopulmonary disease. Electronically  Signed   By: Monte Fantasia M.D.   On: 10/16/2015 11:54   Dg Hip Unilat  With Pelvis 2-3 Views Right  10/28/2015  CLINICAL DATA:  Fall.  Tail bone and groin pain. EXAM: DG HIP (WITH OR WITHOUT PELVIS) 2-3V RIGHT COMPARISON:  07/12/2015 FINDINGS: Chronic deformity involving the right superior and inferior pubic rami identified. Gas and stool overlie the pubic symphysis. There is no evidence of acute hip fracture or dislocation. There is no evidence of arthropathy or other focal bone abnormality. IMPRESSION: 1. No evidence for acute fracture or dislocation. If there is high clinical suspicion for occult fracture or the patient refuses to weightbear, consider further evaluation with MRI. Although CT is expeditious, evidence is lacking regarding accuracy of CT over plain film radiography. 2. Chronic deformity involves the right superior and inferior pubic rami. Likely posttraumatic. Electronically Signed   By: Kerby Moors M.D.   On: 10/28/2015 18:59     EKG: (Independently reviewed) sinus rhythm with ventricular rate 82 bpm, QTC 492 ms, no ischemic changes appreciated.   Assessment & Plan  Principal Problem:   Metabolic encephalopathy -Inpatient telemetry -Symptoms seem more consistent with metabolic etiology as opposed to acute CVA but as precaution obtain MRI of the brain-if positive for acute stroke formal neurology consult will need to be obtained -Plan to treat underlying causes such as suspected UTI as well as underlying dehydration -Due to degree of lethargy we'll keep NPO to more alert  Active  Problems:   Leukocytosis -Elevated white count could simply be related to patient's degree of significant dehydration or could be more reflective of underlying infectious process such as UTI and potentially pneumonia process -Chest x-ray negative but will repeat in a.m. after hydration  -We'll go ahead and treat as UTI (Levaquin) and obtain urine culture and blood cultures -Follow labs -As precaution check influenza PCR panel as well as respiratory viral panel    Urinary retention -Issue during previous hospitalization requiring Foley catheter which was continued upon discharge -Was evaluated by urology during that admission and was found to have incomplete bladder emptying in setting of elevated creatinine with post void residuals greater than 400 mL. Documented prior to admission she had urgency, frequency and nocturia with urge incontinence. Is not catheter had to be inserted by physician. -Recommendation was to continue Foley catheter for 1 week and follow-up with urology after discharge for voiding trial-patient discharged on 12/9 and Foley catheter remains in place and uncertain if patient actually followed up with urology post discharge    Dehydration, moderate -Continue gentle IV fluid hydration -Follow labs    ANEMIA -Hemoglobin stable and at baseline    CKD stage 3, GFR 30-59 ml/min -Renal function stable and at baseline    Hypothyroidism -Continue preadmission Synthroid    History of bacterial enteritis -Completed full course of Cipro and Flagyl    Unspecified protein-calorie malnutrition /Dementia -Once diet resumed will need formal nutrition evaluation    GERD  -Apparent history of previous esophageal stricture -May require formal speech therapy evaluation regarding swallowing    Hyperlipidemia    DVT Prophylaxis: Lovenox  Family Communication:   No family at bedside  Code Status:  Full code  Condition: Stable   Discharge disposition: Anticipate discharge  back discomfort nurse facility once medically stable  Time spent in minutes : 60      ELLIS,ALLISON L. ANP on 11/09/2015 at 5:04 PM  You may contact me by going to [email or web address] - password [email or web address]  I  am available from 7a-7p but please confirm I am on the schedule by going to Amion as above.   After 7p please contact night coverage person covering me after hours  [facility] I have taken an interval history, reviewed the chart and examined the patient. I agree with the Advanced Practice Provider's note, impression and recommendations. I have made any necessary editorial changes.  81 year old female recently discharged from this facility on 12/9 after being admitted for bacterial enteritis Today was brought from the skilled facility with altered mental status. There was a question of right-sided facial droop. Patient usually able to feed herself but had to be fed today by the nursing staff. No other history obtainable by the patient. Chest x-ray showed compression fracture of T10 which is new since 2014. No pneumonia noted on the x-ray. CT head was negative for stroke. MRI brain has been ordered.  On exam Lethargic appearing female, Alert oriented x2 Chest is clear to auscultation bilaterally  Assessment/plan Encephalopathy- likely metabolic,  MRI brain ordered, will follow the results. Patient started on Levaquin for possible UTI. Follow urine culture results. Patient has a Foley catheter in place, which was inserted in previous admission and she was supposed to follow up with urology in 1 week. Consider calling urology in a.m. for further recommendations.  MRI brain was done which showed 12 acute microembolic infarctions scattered throughout the brain consistent with a embolic disease from the heart and ascending aorta.  Will consult neurology, and initiate stroke workup.

## 2015-11-09 NOTE — ED Notes (Signed)
No flu swabs in ED, called main lab - states they are running low but will send one to ED , have not received swab yet. Pt in MRI .

## 2015-11-09 NOTE — ED Provider Notes (Addendum)
CSN: [id number]     Arrival date & time 11/09/15  1414 History   First MD Initiated Contact with Patient 11/09/15 1416     No chief complaint on file.    (Consider location/radiation/quality/duration/timing/severity/associated sxs/prior Treatment) HPI Comments: 81 year old female with extensive past medical history including CVA, CK D, HLD, GERD who p/w altered mental status. History limited because of patient's altered mentation and obtained from EMS and nursing home. Per EMS, staff reported that the patient was more lethargic today and they noted a right-sided facial droop. The patient has also been leaning to her right side. She has not been talkative today and normally is. I contacted the nursing facility and one of the nurses reports that this morning the patient was generally weaker than normal and required somebody to feed her which is abnormal for her. They noted some mild right-sided weakness that got worse throughout the day. Her last known normal was sometime last night. Her nursing home denies any recent fevers or known illness. Was treated recently for a urinary tract infection.  The history is provided by the EMS personnel and the nursing home.    Past Medical History  Diagnosis Date  . Diverticulosis   . Esophageal stricture   . Hiatal hernia   . Iron deficiency anemia   . Depression   . Anxiety   . Arthritis   . Asthma   . Gastric ulcer 1994  . Partial small bowel obstruction ([place]) 1995  . IBS (irritable bowel syndrome)   . GERD (gastroesophageal reflux disease)   . Osteoporosis   . Insomnia   . Macular degeneration   . Seborrheic keratosis   . Stroke ([place])   . Dementia   . CKD (chronic kidney disease) stage 3, GFR 30-59 ml/min 10/14/2015  . Hypothyroidism   . Hyperlipidemia    Past Surgical History  Procedure Laterality Date  . Cholecystectomy    . Appendectomy    . Tonsillectomy    . Spine surgery    . Bunionectomy    . Sinus surgery with instatrak    .  Abdominal hysterectomy     Family History  Problem Relation Age of Onset  . Ovarian cancer Mother   . Heart disease    . Macular degeneration    . Colon cancer Neg Hx    Social History  Substance Use Topics  . Smoking status: Never Smoker   . Smokeless tobacco: Never Used  . Alcohol Use: Yes     Comment: not often    OB History    No data available     Review of Systems  Unable to perform ROS: Mental status change      Allergies  Iohexol; Milk-related compounds; Nexium; and Penicillins  Home Medications   Prior to Admission medications   Medication Sig Start Date End Date Taking? Authorizing Provider  acetaminophen (TYLENOL) 325 MG tablet Take 650 mg by mouth every 6 (six) hours as needed for moderate pain or fever.   Yes Historical Provider, MD  atorvastatin (LIPITOR) 10 MG tablet Take 10 mg by mouth daily at 6 PM.    Yes Historical Provider, MD  clotrimazole (LOTRIMIN) 1 % cream Apply 1 application topically 2 (two) times daily as needed (rash).  10/20/15  Yes Historical Provider, MD  famotidine (PEPCID) 20 MG tablet Take 1 tablet (20 mg total) by mouth daily. 10/19/15  Yes Lavina Hamman, MD  FLUoxetine (PROZAC) 20 MG capsule Take 20 mg by mouth daily.  Yes Historical Provider, MD  Fluticasone Furoate-Vilanterol (BREO ELLIPTA) 100-25 MCG/INH AEPB Inhale 1 puff into the lungs daily.   Yes Historical Provider, MD  guaifenesin (ROBITUSSIN) 100 MG/5ML syrup Take 100 mg by mouth 4 (four) times daily as needed for cough.   Yes Historical Provider, MD  iron polysaccharides (NIFEREX) 150 MG capsule Take 150 mg by mouth 2 (two) times daily.     Yes Historical Provider, MD  levothyroxine (SYNTHROID, LEVOTHROID) 75 MCG tablet Take 75 mcg by mouth daily before breakfast.   Yes Historical Provider, MD  loratadine (CLARITIN) 10 MG tablet Take 10 mg by mouth daily.   Yes Historical Provider, MD  LORazepam (ATIVAN) 0.5 MG tablet Take 1 tablet (0.5 mg total) by mouth 2 (two) times daily.  10/20/15  Yes Lavina Hamman, MD  megestrol (MEGACE) 40 MG/ML suspension Take 10 mLs (400 mg total) by mouth daily. 10/28/15  Yes Varney Biles, MD  Memantine HCl ER (NAMENDA XR) 28 MG CP24 Take 28 mg by mouth daily.   Yes Historical Provider, MD  mirtazapine (REMERON) 15 MG tablet Take 15 mg by mouth at bedtime.     Yes Historical Provider, MD  Multiple Vitamins-Minerals (PRESERVISION AREDS 2 PO) Take 1 capsule by mouth 2 (two) times daily.    Yes Historical Provider, MD  nitrofurantoin, macrocrystal-monohydrate, (MACROBID) 100 MG capsule Take 100 mg by mouth 2 (two) times daily.   Yes Historical Provider, MD  PRESCRIPTION MEDICATION Apply 1 application topically as needed (for itching to skin). TRIAMCINOLONE 0.1% AND HYDROCERIN 2:1 CREAM   Yes Historical Provider, MD  tamsulosin (FLOMAX) 0.4 MG CAPS capsule Take 1 capsule (0.4 mg total) by mouth daily after supper. 10/20/15  Yes Lavina Hamman, MD  traMADol (ULTRAM) 50 MG tablet Take 1 tablet (50 mg total) by mouth every 8 (eight) hours as needed (for pain). 10/20/15  Yes Lavina Hamman, MD  triamcinolone cream (KENALOG) 0.5 % Apply 1 application topically 3 (three) times daily as needed (rash). Apply to nose and neck rash as needed   Yes Historical Provider, MD  Vitamin D, Ergocalciferol, (DRISDOL) 50000 UNITS CAPS capsule Take 50,000 Units by mouth every 7 (seven) days.   Yes Historical Provider, MD   BP 118/52 mmHg  Pulse 85  Temp(Src) 99.1 F (37.3 C) (Oral)  Resp 22  SpO2 97% Physical Exam  Constitutional: No distress.  Thin, frail-appearing elderly woman leaning to the right side  HENT:  Head: Normocephalic and atraumatic.  Mild facial droop on L mouth, dry mucous membranes  Eyes: Pupils are equal, round, and reactive to light. Right eye exhibits discharge. Left eye exhibits no discharge.  Neck: Neck supple.  Cardiovascular: Normal rate, regular rhythm and normal heart sounds.   No murmur heard. Pulmonary/Chest: Effort normal and  breath sounds normal. No respiratory distress.  Abdominal: Soft. Bowel sounds are normal. She exhibits no distension. There is no tenderness.  Genitourinary:  foley catheter in place  Musculoskeletal: She exhibits no edema.  Neurological:  Awake, able to state name but unable to answer any other questions, will squeeze hands to command, mildly asymmetric grip strength with right hand weaker than left, unable to perform remainder of neurologic exam  Skin: Skin is warm and dry. No rash noted.  Nursing note and vitals reviewed.   ED Course  Procedures (including critical care time) Labs Review Labs Reviewed  COMPREHENSIVE METABOLIC PANEL - Abnormal; Notable for the following:    Glucose, Bld 122 (*)    BUN  23 (*)    Creatinine, Ser 1.38 (*)    Total Protein 5.9 (*)    Albumin 3.3 (*)    GFR calc non Af Amer 34 (*)    GFR calc Af Amer 39 (*)    All other components within normal limits  CBC WITH DIFFERENTIAL/PLATELET - Abnormal; Notable for the following:    WBC 15.0 (*)    RBC 3.61 (*)    Hemoglobin 11.4 (*)    HCT 35.3 (*)    Neutro Abs 12.9 (*)    All other components within normal limits  URINALYSIS, ROUTINE W REFLEX MICROSCOPIC (NOT AT [facility]) - Abnormal; Notable for the following:    Color, Urine RED (*)    APPearance TURBID (*)    pH 8.5 (*)    Ketones, ur 15 (*)    Protein, ur 100 (*)    Leukocytes, UA SMALL (*)    All other components within normal limits  URINE MICROSCOPIC-ADD ON - Abnormal; Notable for the following:    Squamous Epithelial / LPF 0-5 (*)    Bacteria, UA RARE (*)    Crystals TRIPLE PHOSPHATE CRYSTALS (*)    All other components within normal limits  URINE CULTURE  RESPIRATORY VIRUS PANEL  INFLUENZA PANEL BY PCR (TYPE A & B, H1N1)  I-STAT TROPOININ, ED    Imaging Review Dg Chest 2 View  11/09/2015  CLINICAL DATA:  Altered mental status. Right facial droop. Weakness. EXAM: CHEST  2 VIEW COMPARISON:  Chest x-rays dated 10/27/2015 and 08/24/2013  and CT scan of the chest dated 03/19/2006 FINDINGS: Heart size and pulmonary vascularity are normal and the lungs are clear. Large hiatal hernia. Stable cyst on the lateral aspect of the right hemidiaphragm, unchanged since 2007. There is a compression fracture of the superior aspect of T10, new since 08/24/2013. IMPRESSION: Compression fracture of T10, new since 2014. It is unchanged since 10/27/2015 no better seen on today's study because of the lateral view. Chronic large hiatal hernia. Stable cyst on the right hemidiaphragm. Electronically Signed   By: Lorriane Shire M.D.   On: 11/09/2015 15:09   Ct Head Wo Contrast  11/09/2015  CLINICAL DATA:  Altered mental status, lethargy today, RIGHT-side facial droop, leaning to RIGHT side, weakness, unknown when last seen normal EXAM: CT HEAD WITHOUT CONTRAST TECHNIQUE: Contiguous axial images were obtained from the base of the skull through the vertex without intravenous contrast. COMPARISON:  10/28/2015 FINDINGS: Asymmetric positioning in gantry. Generalized atrophy with ex vacuo dilatation of the ventricular system. No midline shift or mass effect. Minimal small vessel chronic ischemic changes of deep cerebral white matter. No intracranial hemorrhage, mass lesion or evidence acute infarction. Benign-appearing basal ganglia calcifications again identified. No extra-axial fluid collections. Question small mucosal retention cyst within the RIGHT maxillary sinus. Remaining bones and sinuses unremarkable. IMPRESSION: Atrophy with minimal small vessel chronic ischemic changes of deep cerebral white matter. No acute intracranial abnormalities. Electronically Signed   By: Lavonia Dana M.D.   On: 11/09/2015 15:17   I have personally reviewed and evaluated these lab results as part of my medical decision-making.   EKG Interpretation   Date/Time:  Thursday November 09 2015 14:24:50 EST Ventricular Rate:  82 PR Interval:  177 QRS Duration: 86 QT Interval:  413 QTC  Calculation: 482 R Axis:   -24 Text Interpretation:  Sinus rhythm Inferior infarct, old No significant  change since last tracing Confirmed by Ryshawn Sanzone MD, Ronen Bromwell [phone]) on  11/09/2015 2:51:39 PM  Medications  sodium chloride 0.9 % bolus 1,000 mL (1,000 mLs Intravenous New Bag/Given 11/09/15 1539)    MDM   Final diagnoses:  Altered mental status, unspecified altered mental status type  Dehydration   Weakness   Patient presents from nursing facility for decreased level of consciousness and concern for possible right-sided weakness, unclear time of onset and patient last seen normal yesterday evening. On arrival by EMS, the patient was awake but only able to state her name. She was not able to follow most commands. Possible right-sided facial droop. Because of unclear time of onset and last known normal yesterday, did not call stroke alert but did obtain above labs, CXR, and head CT. EKG unchanged from previous.  Head CT negative acute. Chest x-ray shows no acute infiltrate to explain the patient's symptoms. UA not obviously infected. Some evidence of dehydration on UA, gave 1 L of IV fluids. I have ordered an MRI of the brain for evaluation of stroke. Discussed patient with [facility], Ebony Hail, who will admit the patient for further care.  Sharlett Iles, MD 11/09/15 [place], MD 11/09/15 1600

## 2015-11-09 NOTE — ED Notes (Signed)
Pt presents from [facility].  Per EMS, staff reported that pt was noted to be lethargic today, last known normal unknown.  PTAR called to facility and noted R sided facial droop with pt leaning to R side that staff had not noted.

## 2015-11-10 ENCOUNTER — Inpatient Hospital Stay (HOSPITAL_COMMUNITY): Payer: Medicare Other

## 2015-11-10 DIAGNOSIS — I6789 Other cerebrovascular disease: Secondary | ICD-10-CM

## 2015-11-10 DIAGNOSIS — I639 Cerebral infarction, unspecified: Secondary | ICD-10-CM

## 2015-11-10 DIAGNOSIS — E785 Hyperlipidemia, unspecified: Secondary | ICD-10-CM

## 2015-11-10 DIAGNOSIS — E46 Unspecified protein-calorie malnutrition: Secondary | ICD-10-CM

## 2015-11-10 DIAGNOSIS — R4182 Altered mental status, unspecified: Secondary | ICD-10-CM

## 2015-11-10 DIAGNOSIS — I6340 Cerebral infarction due to embolism of unspecified cerebral artery: Secondary | ICD-10-CM

## 2015-11-10 LAB — BASIC METABOLIC PANEL
ANION GAP: 9 (ref 5–15)
BUN: 18 mg/dL (ref 6–20)
CALCIUM: 8 mg/dL — AB (ref 8.9–10.3)
CHLORIDE: 114 mmol/L — AB (ref 101–111)
CO2: 18 mmol/L — AB (ref 22–32)
CREATININE: 1.16 mg/dL — AB (ref 0.44–1.00)
GFR calc non Af Amer: 42 mL/min — ABNORMAL LOW (ref >60)
GFR, EST AFRICAN AMERICAN: 48 mL/min — AB (ref >60)
Glucose, Bld: 81 mg/dL (ref 65–99)
Potassium: 3.8 mmol/L (ref 3.5–5.1)
SODIUM: 141 mmol/L (ref 135–145)

## 2015-11-10 LAB — CBC
HCT: 29.6 % — ABNORMAL LOW (ref 36.0–46.0)
HEMOGLOBIN: 9.8 g/dL — AB (ref 12.0–15.0)
MCH: 32.3 pg (ref 26.0–34.0)
MCHC: 33.1 g/dL (ref 30.0–36.0)
MCV: 97.7 fL (ref 78.0–100.0)
PLATELETS: 142 10*3/uL — AB (ref 150–400)
RBC: 3.03 MIL/uL — AB (ref 3.87–5.11)
RDW: 13.9 % (ref 11.5–15.5)
WBC: 13.2 10*3/uL — AB (ref 4.0–10.5)

## 2015-11-10 LAB — LIPID PANEL
CHOL/HDL RATIO: 2.1 ratio
Cholesterol: 78 mg/dL (ref 0–200)
HDL: 38 mg/dL — AB (ref >40)
LDL CALC: 33 mg/dL (ref 0–99)
TRIGLYCERIDES: 37 mg/dL (ref <150)
VLDL: 7 mg/dL (ref 0–40)

## 2015-11-10 LAB — URINE CULTURE

## 2015-11-10 LAB — INFLUENZA PANEL BY PCR (TYPE A & B)
H1N1FLUPCR: NOT DETECTED
INFLAPCR: NEGATIVE
Influenza B By PCR: NEGATIVE

## 2015-11-10 MED ORDER — LEVOFLOXACIN 500 MG PO TABS
250.0000 mg | ORAL_TABLET | Freq: Every day | ORAL | Status: DC
  Administered 2015-11-10 – 2015-11-12 (×3): 250 mg via ORAL
  Filled 2015-11-10 (×3): qty 1

## 2015-11-10 MED ORDER — ATORVASTATIN CALCIUM 10 MG PO TABS
10.0000 mg | ORAL_TABLET | Freq: Every day | ORAL | Status: DC
Start: 2015-11-10 — End: 2015-11-13
  Administered 2015-11-10 – 2015-11-12 (×3): 10 mg via ORAL
  Filled 2015-11-10 (×2): qty 1

## 2015-11-10 MED ORDER — MEMANTINE HCL ER 28 MG PO CP24
28.0000 mg | ORAL_CAPSULE | Freq: Every day | ORAL | Status: DC
  Administered 2015-11-10 – 2015-11-13 (×4): 28 mg via ORAL
  Filled 2015-11-10 (×5): qty 1

## 2015-11-10 NOTE — Evaluation (Signed)
Clinical/Bedside Swallow Evaluation Patient Details  Name: Melanie Levy MRN: [id number] Date of Birth: 09/26/1928  Today's Date: 11/10/2015 Time: SLP Start Time (ACUTE ONLY): 0810 SLP Stop Time (ACUTE ONLY): 0827 SLP Time Calculation (min) (ACUTE ONLY): 17 min  Past Medical History:  Past Medical History  Diagnosis Date  . Diverticulosis   . Esophageal stricture   . Hiatal hernia   . Iron deficiency anemia   . Depression   . Anxiety   . Arthritis   . Asthma   . Gastric ulcer 1994  . Partial small bowel obstruction ([place]) 1995  . IBS (irritable bowel syndrome)   . GERD (gastroesophageal reflux disease)   . Osteoporosis   . Insomnia   . Macular degeneration   . Seborrheic keratosis   . Stroke ([place])   . Dementia   . CKD (chronic kidney disease) stage 3, GFR 30-59 ml/min 10/14/2015  . Hypothyroidism   . Hyperlipidemia    Past Surgical History:  Past Surgical History  Procedure Laterality Date  . Cholecystectomy    . Appendectomy    . Tonsillectomy    . Spine surgery    . Bunionectomy    . Sinus surgery with instatrak    . Abdominal hysterectomy     HPI:  Melanie Levy is a 81 y.o. female with a history of dementia at baseline sometimes recognizes her is her daughter and sometimes does not. Was seen to have an increase in altered mental status and question of right facial droop at the nursing home and was therefore transferred to [facility]. Here, the daughter states that when she saw her in the emergency room she was actually doing reasonably well.  MRi shows Approximately 12 acute micro embolic infarctions scattered throughout the brain. Pt has a history of a nonobstructing stricture in the distal esophagus.    Assessment / Plan / Recommendation Clinical Impression  Pt demonstrates no signs of neuromuscular impairment or aspiration. Pt is able to masticate and transit soldis, but has a prolonged movement of mouth after bolus is transited, no residuals observed.  Suspect this is due to attention/cognitive deficits, possibly present at baseline. Recommend pt initiate a regular diet and thin liquids with f/u x1 for tolerance to determine if texture downgrade is needed. Pt may orally hold solid texture or need sueprvision with meals if she cannot sustain attention to self feeding.     Aspiration Risk  Mild aspiration risk    Diet Recommendation Regular;Thin liquid   Liquid Administration via: Cup;Straw Medication Administration: Whole meds with liquid Supervision: Intermittent supervision to cue for compensatory strategies Compensations: Minimize environmental distractions;Slow rate;Small sips/bites Postural Changes: Seated upright at 90 degrees    Other  Recommendations Oral Care Recommendations: Oral care BID   Follow up Recommendations  Skilled Nursing facility    Frequency and Duration min 1 x/week  1 week       Prognosis Prognosis for Safe Diet Advancement: Good      Swallow Study   General HPI: Melanie Levy is a 81 y.o. female with a history of dementia at baseline sometimes recognizes her is her daughter and sometimes does not he was seen to have an increase in altered mental status and question of right facial droop at the nursing home and was therefore transferred to [facility] cone. Here, the daughter states that when she saw her in the emergency room she was actually doing reasonably well.  MRi shows Approximately 12 acute micro embolic infarctions scattered throughout the  brain. Pt has a history of a nonobstructing stricture in the distal esophagus.  Type of Study: Bedside Swallow Evaluation Previous Swallow Assessment: none in chart Diet Prior to this Study: NPO Temperature Spikes Noted: No Respiratory Status: Room air History of Recent Intubation: No Behavior/Cognition: Alert;Cooperative;Pleasant mood;Distractible;Requires cueing Oral Cavity Assessment: Within Functional Limits Oral Care Completed by SLP: No Oral Cavity -  Dentition: Adequate natural dentition Vision: Functional for self-feeding Self-Feeding Abilities: Able to feed self Patient Positioning: Upright in bed Baseline Vocal Quality: Normal Volitional Cough: Strong Volitional Swallow: Able to elicit    Oral/Motor/Sensory Function Overall Oral Motor/Sensory Function: Within functional limits   Ice Chips     Thin Liquid Thin Liquid: Within functional limits Presentation: Cup;Straw;Self Fed    Nectar Thick Nectar Thick Liquid: Not tested   Honey Thick Honey Thick Liquid: Not tested   Puree Puree: Impaired Oral Phase Functional Implications: Prolonged oral transit   Solid   GO    Solid: Impaired Oral Phase Functional Implications: Prolonged oral transit      Herbie [place], MA CCC-SLP [phone]  [name], [name] 11/10/2015,8:33 AM

## 2015-11-10 NOTE — Consult Note (Signed)
Neurology Consultation Reason for Consult: Stroke Referring Physician: Georgiann Mohs  CC: Stroke  History is obtained from: Patient, daughter  HPI: Melanie Levy is a 81 y.o. female with a history of dementia at baseline sometimes recognizes her is her daughter and sometimes does not he was seen to have an increase in altered mental status and question of right facial droop at the nursing home and was therefore transferred to [facility] cone. Here, the daughter states that when she saw her in the emergency room she was actually doing reasonably well. Workup was undertaken which shows multifocal embolic infarcts and therefore neurology has been further consulted.   LKW: Unclear tpa given?: no, unclear time of onset    ROS: Unable to obtain due to altered mental status.   Past Medical History  Diagnosis Date  . Diverticulosis   . Esophageal stricture   . Hiatal hernia   . Iron deficiency anemia   . Depression   . Anxiety   . Arthritis   . Asthma   . Gastric ulcer 1994  . Partial small bowel obstruction ([place]) 1995  . IBS (irritable bowel syndrome)   . GERD (gastroesophageal reflux disease)   . Osteoporosis   . Insomnia   . Macular degeneration   . Seborrheic keratosis   . Stroke ([place])   . Dementia   . CKD (chronic kidney disease) stage 3, GFR 30-59 ml/min 10/14/2015  . Hypothyroidism   . Hyperlipidemia      Family History  Problem Relation Age of Onset  . Ovarian cancer Mother   . Heart disease    . Macular degeneration    . Colon cancer Neg Hx      Social History:  reports that she has never smoked. She has never used smokeless tobacco. She reports that she drinks alcohol. She reports that she does not use illicit drugs.   Exam: Current vital signs: BP 146/55 mmHg  Pulse 78  Temp(Src) 98.4 F (36.9 C) (Oral)  Resp 18  Wt 54.2 kg (119 lb 7.8 oz)  SpO2 98% Vital signs in last 24 hours: Temp:  [98.3 F (36.8 C)-99.1 F (37.3 C)] 98.4 F (36.9 C) (12/29  2032) Pulse Rate:  [78-85] 78 (12/29 2032) Resp:  [16-22] 18 (12/29 2032) BP: (118-146)/(50-65) 146/55 mmHg (12/29 2032) SpO2:  [97 %-99 %] 98 % (12/29 2032) Weight:  [54.2 kg (119 lb 7.8 oz)] 54.2 kg (119 lb 7.8 oz) (12/29 2032)   Physical Exam  Constitutional: Appears well-developed and well-nourished.  Psych: She is not very talkative Eyes: No scleral injection HENT: No OP obstrucion Head: Normocephalic.  Cardiovascular: Normal rate and regular rhythm.  Respiratory: Effort normal and breath sounds normal to anterior ascultation GI: Soft.  No distension. There is no tenderness.  Skin: WDI  Neuro: Mental Status: Patient is awake, alert, oriented to person only He is not able to give me any helpful information regarding her stay She is initially cooperative, but then loses interest and is less cooperative Cranial Nerves: II: Visual Fields are full. Pupils are equal, round, and reactive to light.   III,IV, VI: EOMI without ptosis or diploplia.  V: Facial sensation is symmetric to temperature VII: Facial movement is symmetric.  VIII: hearing is intact to voice X: Uvula elevates symmetrically XI: Shoulder shrug is symmetric. XII: tongue is midline without atrophy or fasciculations.  Motor: She does not comply with formal testing, but does appear to move all extremities equally Sensory: She endorses sensation in all 4 extremities  Cerebellar: FNF and HKS are intact bilaterally   I have reviewed labs in epic and the results pertinent to this consultation are: Flu-negative  I have reviewed the images obtained: MRI brain-multifocal infarcts  Impression: 81 year old female with multifocal embolic strokes likely aortic or cardiac in origin. After discussing with her daughter, we will pursue workup.  Recommendations: 1. HgbA1c, fasting lipid panel 2. MRI, MRA  of the brain without contrast 3. Frequent neuro checks 4. Echocardiogram 5. Prophylactic therapy-Antiplatelet med:  Aspirin - dose 325mg  PO or 300mg  PR 6. Risk factor modification 7. Telemetry monitoring 8. PT consult, OT consult, Speech consult 9. Continue Marton Redwood, MD [facility] [phone]  If 7pm- 7am, please page neurology on call as listed in [place].

## 2015-11-10 NOTE — Evaluation (Signed)
Speech Language Pathology Evaluation Patient Details Name: Melanie Levy MRN: [id number] DOB: July 03, 1928 Today's Date: 11/10/2015 Time: 0810-0828 SLP Time Calculation (min) (ACUTE ONLY): 18 min  Problem List:  Patient Active Problem List   Diagnosis Date Noted  . Metabolic encephalopathy 99991111  . Dehydration, moderate 11/09/2015  . Urinary retention 10/20/2015  . History of bacterial enteritis 10/18/2015  . Abnormal CT scan, small bowel 10/14/2015  . CKD (chronic kidney disease) stage 3, GFR 30-59 ml/min 10/14/2015  . Dementia 10/14/2015  . Leukocytosis 10/14/2015  . GERD (gastroesophageal reflux disease) 10/14/2015  . Hypothyroidism 10/14/2015  . Hyperlipidemia   . Gait instability 08/27/2013    Class: Acute  . Unspecified protein-calorie malnutrition ([place]) 08/27/2013    Class: Chronic  . Multiple skin tears 08/27/2013    Class: Acute  . ANEMIA 03/12/2008  . DEPRESSION/ANXIETY 03/12/2008  . ASTHMA 03/12/2008  . ESOPHAGEAL STRICTURE 03/12/2008  . HIATAL HERNIA 03/12/2008  . DIVERTICULOSIS, COLON 03/12/2008  . ARTHRITIS 03/12/2008   Past Medical History:  Past Medical History  Diagnosis Date  . Diverticulosis   . Esophageal stricture   . Hiatal hernia   . Iron deficiency anemia   . Depression   . Anxiety   . Arthritis   . Asthma   . Gastric ulcer 1994  . Partial small bowel obstruction ([place]) 1995  . IBS (irritable bowel syndrome)   . GERD (gastroesophageal reflux disease)   . Osteoporosis   . Insomnia   . Macular degeneration   . Seborrheic keratosis   . Stroke ([place])   . Dementia   . CKD (chronic kidney disease) stage 3, GFR 30-59 ml/min 10/14/2015  . Hypothyroidism   . Hyperlipidemia    Past Surgical History:  Past Surgical History  Procedure Laterality Date  . Cholecystectomy    . Appendectomy    . Tonsillectomy    . Spine surgery    . Bunionectomy    . Sinus surgery with instatrak    . Abdominal hysterectomy     HPI:  Melanie Levy  is a 81 y.o. female with a history of dementia at baseline sometimes recognizes her is her daughter and sometimes does not. Was seen to have an increase in altered mental status and question of right facial droop at the nursing home and was therefore transferred to [facility]. Here, the daughter states that when she saw her in the emergency room she was actually doing reasonably well.  MRi shows Approximately 12 acute micro embolic infarctions scattered throughout the brain. Pt has a history of a nonobstructing stricture in the distal esophagus.    Assessment / Plan / Recommendation Clinical Impression  Pt demonstrates primary cognitive impairment, unclear if pt is much changed from baseline given history of dementia. The pt demonstrates intermittent focused to briefly sustained attention, following commands in about 50% of opportunities. Pt is able to verbalize without dysarthria, but does not respond to some questions due to attention impairment and would not read single words. Aphasia is possible, but not likely. Will f/u x1 when family present to discuss changes in function from baseline and desire for SLP f/u at next level of care, likely SNF.     SLP Assessment  Patient needs continued Speech Lanaguage Pathology Services    Follow Up Recommendations  Skilled Nursing facility    Frequency and Duration min 1 x/week  1 week      SLP Evaluation Prior Functioning  Cognitive/Linguistic Baseline: Baseline deficits Baseline deficit details: dementia Type of  Home: [place]   Cognition  Overall Cognitive Status: History of cognitive impairments - at baseline Arousal/Alertness: Awake/alert Orientation Level: Oriented to person;Oriented to place;Disoriented to time;Disoriented to situation Attention: Focused;Sustained Focused Attention: Impaired Focused Attention Impairment: Verbal basic;Functional basic Sustained Attention: Impaired Sustained Attention Impairment: Verbal  basic;Functional basic Memory: Impaired Awareness: Impaired Problem Solving: Impaired    Comprehension  Auditory Comprehension Overall Auditory Comprehension: Impaired Yes/No Questions: Impaired Basic Biographical Questions: 51-75% accurate Commands: Impaired One Step Basic Commands: 75-100% accurate Interfering Components: Attention Reading Comprehension Reading Status: Impaired Word level: Impaired Functional Environmental (signs, name badge): Impaired Interfering Components: Processing time;Attention    Expression Verbal Expression Overall Verbal Expression: Appears within functional limits for tasks assessed   Oral / Motor Oral Motor/Sensory Function Overall Oral Motor/Sensory Function: Within functional limits Motor Speech Overall Motor Speech: Appears within functional limits for tasks assessed   Herbie [place], MA CCC-SLP [phone]  Lynann Beaver 11/10/2015, 8:42 AM

## 2015-11-10 NOTE — Progress Notes (Signed)
VASCULAR LAB PRELIMINARY  PRELIMINARY  PRELIMINARY  PRELIMINARY  Carotid duplex completed.    Preliminary report:  Bilateral:  1-39% ICA stenosis.  Vertebral artery flow is antegrade.     Asiya Cutbirth, RVS 11/10/2015, 3:58 PM

## 2015-11-10 NOTE — Evaluation (Signed)
Physical Therapy Evaluation Patient Details Name: Melanie Levy MRN: [id number] DOB: 14-Jan-1928 Today's Date: 11/10/2015   History of Present Illness  Patient admitted with altered mental status.  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  PMH significant for dementia and multiple medical issues.  Clinical Impression  Patient presents with dependencies in all mobility and transfers, however, appears close to baseline.  No further acute PT needs identified, however patient may benefit from F/U PT at ALF to ensure full return to baseline and address positioning as patient developing some contractures (knee extension and ankle dorsiflex).  Will sign off.    Follow Up Recommendations Home health PT (at ALF)    Equipment Recommendations  None recommended by PT    Recommendations for Other Services       Precautions / Restrictions Precautions Precautions: Fall      Mobility  Bed Mobility Overal bed mobility: Needs Assistance Bed Mobility: Supine to Sit     Supine to sit: Max assist     General bed mobility comments: patient able to initiate bringing legs to edge of bed, required max to raise shoulders off bed and scoot to sitting EOB.  Transfers Overall transfer level: Needs assistance Equipment used: Rolling walker (2 wheeled) Transfers: Sit to/from [facility] Sit to Stand: Total assist Stand pivot transfers: Max assist       General transfer comment: attempted sit to stand to RW - unable to reach upright - leaning posteriorly.  Then, assisted with bed to chair transfers, max assist.  Ambulation/Gait                Stairs            Wheelchair Mobility    Modified Rankin (Stroke Patients Only)       Balance Overall balance assessment: Needs assistance Sitting-balance support: No upper extremity supported Sitting balance-Leahy Scale: Fair   Postural control: Posterior lean Standing balance support: No  upper extremity supported Standing balance-Leahy Scale: Zero                               Pertinent Vitals/Pain Pain Assessment: No/denies pain Faces Pain Scale: No hurt    Home Living Family/patient expects to be discharged to:: Assisted living     Type of Home: Assisted living           Additional Comments: Patient unable to report; this information was obtained from chart review    Prior Function Level of Independence: Needs assistance   Gait / Transfers Assistance Needed: per chart patient is w/c bound and requires assistance for transfers           Hand Dominance        Extremity/Trunk Assessment   Upper Extremity Assessment: RUE deficits/detail;LUE deficits/detail RUE Deficits / Details: limited shoulder elevation, otherwise functional ROM, grossly 3/5 strength     LUE Deficits / Details: limited shoulder elevation, otherwise functional ROM, grossly 3/5 strength   Lower Extremity Assessment: Generalized weakness;RLE deficits/detail RLE Deficits / Details: ankle dorsiflex only to neurtral, knee extension limited - unable to reach full extension       Communication   Communication: HOH;No difficulties  Cognition Arousal/Alertness: Awake/alert Behavior During Therapy: Flat affect Overall Cognitive Status: No family/caregiver present to determine baseline cognitive functioning                      General Comments  Exercises        Assessment/Plan    PT Assessment All further PT needs can be met in the next venue of care  PT Diagnosis Generalized weakness   PT Problem List Decreased balance;Decreased mobility  PT Treatment Interventions     PT Goals (Current goals can be found in the Care Plan section) Acute Rehab PT Goals PT Goal Formulation: All assessment and education complete, DC therapy    Frequency     Barriers to discharge        Co-evaluation               End of Session   Activity Tolerance:  No increased pain;Patient tolerated treatment well Patient left: in chair;with call bell/phone within reach;with chair alarm set           Time: 1050-1116 PT Time Calculation (min) (ACUTE ONLY): 26 min   Charges:   PT Evaluation $Initial PT Evaluation Tier I: 1 Procedure PT Treatments $Therapeutic Activity: 8-22 mins   PT G CodesShanna Cisco 11/10/2015, 11:33 AM  11/10/2015 [place], [place]

## 2015-11-10 NOTE — Progress Notes (Signed)
OT Cancellation Note  Patient Details Name: NAAMAH SEAVER MRN: [id number] DOB: 09-Dec-1927   Cancelled Treatment:    Reason Eval/Treat Not Completed: Patient at procedure or test/ unavailable - Pt with PT.  Will try back later.   Darlina Rumpf [place], OTR/L [id number]  11/10/2015, 10:57 AM

## 2015-11-10 NOTE — Clinical Social Work Note (Signed)
Clinical Social Work Assessment  Patient Details  Name: Melanie Levy MRN: [id number] Date of Birth: 22-Aug-1928  Date of referral:  11/10/15               Reason for consult:  Facility Placement                Permission sought to share information with:  Chartered certified accountant granted to share information::  Yes, Verbal Permission Granted  Name::     Melanie Levy::  Melanie Levy  Relationship::  dtr  Contact Information:     Housing/Transportation Living arrangements for the past 2 months:  [place] of Information:  Spouse Patient Interpreter Needed:  None Criminal Activity/Legal Involvement Pertinent to Current Situation/Hospitalization:  No - Comment as needed Significant Relationships:  Adult Children Lives with:  Facility Resident Do you feel safe going back to the place where you live?  Yes Need for family participation in patient care:  Yes (Comment) (decision making)  Care giving concerns:  Pt lives at [place]- some concerns about pt return due to recent episodes of wandering   Facilities manager / plan:  CSW spoke with pt dtr concerning return to ALF   Employment status:  Retired Forensic scientist:  Programmer, applications PT Recommendations:  Home with [place] / Referral to community resources:  [place]  Patient/Family's Response to care:  Pt dtr is agreeable to returning to ALF if they will have her- they are hopeful she can return to ALF but also interested in gathering resources about memory care units at ALF and SNF.  Patient/Family's Understanding of and Emotional Response to Diagnosis, Current Treatment, and Prognosis:  No questions or concerns at this time  Emotional Assessment Appearance:  Appears stated age Attitude/Demeanor/Rapport:  Unable to Assess Affect (typically observed):  Unable to Assess Orientation:  Oriented to Self,  Oriented to Place Alcohol / Substance use:  Not Applicable Psych involvement (Current and /or in the community):  No (Comment)  Discharge Needs  Concerns to be addressed:  Care Coordination Readmission within the last 30 days:  Yes Current discharge risk:  None Barriers to Discharge:  Continued Medical Work up   [employer], LCSW 11/10/2015, 5:16 PM

## 2015-11-10 NOTE — Progress Notes (Signed)
PROGRESS NOTE  Melanie Levy [id number] DOB: 23-Aug-1928 DOA: 11/09/2015 PCP: Donnajean Lopes, MD  Assessment/Plan: Metabolic encephalopathy -Inpatient telemetry -Symptoms seem more consistent with metabolic etiology as opposed to acute CVA but as precaution obtain MRI of the brain-if positive for acute stroke formal neurology consult will need to be obtained -Plan to treat underlying causes such as suspected UTI as well as underlying dehydration -SLP  CVA: -12 acute micro embolic infarctions scattered throughout the brain -echo/carotid -will need outpatient 30 day event monitor   Leukocytosis -await culture from urine - influenza PCR panel negative   Urinary retention -Issue during previous hospitalization requiring Foley catheter which was continued upon discharge -Was evaluated by urology during that admission and was found to have incomplete bladder emptying in setting of elevated creatinine with post void residuals greater than 400 mL. Documented prior to admission she had urgency, frequency and nocturia with urge incontinence -not sure if foley was ever removed   Dehydration, moderate -Continue gentle IV fluid hydration -Follow labs   ANEMIA -Hemoglobin stable and at baseline   CKD stage 3, GFR 30-59 ml/min -Renal function stable and at baseline   Hypothyroidism -Continue preadmission Synthroid   History of bacterial enteritis -Completed full course of Cipro and Flagyl   Unspecified protein-calorie malnutrition /Dementia -Once diet resumed will need formal nutrition evaluation   GERD  -Apparent history of previous esophageal stricture -May require formal speech therapy evaluation regarding swallowing   Hyperlipidemia   Code Status: full Family Communication: LM for daughter-- will need to address GOC/code status Disposition Plan: PT/OT--- not ambulatory at baseline, lives in  ALF   Consultants:  neuro  Procedures:     HPI/Subjective: Non-verbal   Objective: Filed Vitals:   11/09/15 2032 11/10/15 0441  BP: 146/55 142/57  Pulse: 78 81  Temp: 98.4 F (36.9 C) 97.9 F (36.6 C)  Resp: 18 18    Intake/Output Summary (Last 24 hours) at 11/10/15 1020 Last data filed at 11/10/15 0457  Gross per 24 hour  Intake      0 ml  Output    725 ml  Net   -725 ml   Filed Weights   11/09/15 2032 11/10/15 0441  Weight: 54.2 kg (119 lb 7.8 oz) 53.8 kg (118 lb 9.7 oz)    Exam:   General:  Awake, not speaking currently  Cardiovascular: rrr  Respiratory: clear  Abdomen: +BS, soft  Musculoskeletal: no edema   Data Reviewed: Basic Metabolic Panel:  Recent Labs Lab 11/09/15 1446 11/10/15 0247  NA 143 141  K 3.9 3.8  CL 108 114*  CO2 22 18*  GLUCOSE 122* 81  BUN 23* 18  CREATININE 1.38* 1.16*  CALCIUM 8.9 8.0*   Liver Function Tests:  Recent Labs Lab 11/09/15 1446  AST 24  ALT 20  ALKPHOS 92  BILITOT 0.3  PROT 5.9*  ALBUMIN 3.3*   No results for input(s): LIPASE, AMYLASE in the last 168 hours. No results for input(s): AMMONIA in the last 168 hours. CBC:  Recent Labs Lab 11/09/15 1446 11/10/15 0247  WBC 15.0* 13.2*  NEUTROABS 12.9*  --   HGB 11.4* 9.8*  HCT 35.3* 29.6*  MCV 97.8 97.7  PLT 171 142*   Cardiac Enzymes: No results for input(s): CKTOTAL, CKMB, CKMBINDEX, TROPONINI in the last 168 hours. BNP (last 3 results) No results for input(s): BNP in the last 8760 hours.  ProBNP (last 3 results) No results for input(s): PROBNP in the last 8760 hours.  CBG: No  results for input(s): GLUCAP in the last 168 hours.  No results found for this or any previous visit (from the past 240 hour(s)).   Studies: Dg Chest 2 View  11/09/2015  CLINICAL DATA:  Altered mental status. Right facial droop. Weakness. EXAM: CHEST  2 VIEW COMPARISON:  Chest x-rays dated 10/27/2015 and 08/24/2013 and CT scan of the chest dated  03/19/2006 FINDINGS: Heart size and pulmonary vascularity are normal and the lungs are clear. Large hiatal hernia. Stable cyst on the lateral aspect of the right hemidiaphragm, unchanged since 2007. There is a compression fracture of the superior aspect of T10, new since 08/24/2013. IMPRESSION: Compression fracture of T10, new since 2014. It is unchanged since 10/27/2015 no better seen on today's study because of the lateral view. Chronic large hiatal hernia. Stable cyst on the right hemidiaphragm. Electronically Signed   By: Lorriane Shire M.D.   On: 11/09/2015 15:09   Ct Head Wo Contrast  11/09/2015  CLINICAL DATA:  Altered mental status, lethargy today, RIGHT-side facial droop, leaning to RIGHT side, weakness, unknown when last seen normal EXAM: CT HEAD WITHOUT CONTRAST TECHNIQUE: Contiguous axial images were obtained from the base of the skull through the vertex without intravenous contrast. COMPARISON:  10/28/2015 FINDINGS: Asymmetric positioning in gantry. Generalized atrophy with ex vacuo dilatation of the ventricular system. No midline shift or mass effect. Minimal small vessel chronic ischemic changes of deep cerebral white matter. No intracranial hemorrhage, mass lesion or evidence acute infarction. Benign-appearing basal ganglia calcifications again identified. No extra-axial fluid collections. Question small mucosal retention cyst within the RIGHT maxillary sinus. Remaining bones and sinuses unremarkable. IMPRESSION: Atrophy with minimal small vessel chronic ischemic changes of deep cerebral white matter. No acute intracranial abnormalities. Electronically Signed   By: Lavonia Dana M.D.   On: 11/09/2015 15:17   Mr Brain Wo Contrast (neuro Protocol)  11/09/2015  CLINICAL DATA:  Acute presentation with altered mental status. Lethargy, right facial drooping confusion. EXAM: MRI HEAD WITHOUT CONTRAST TECHNIQUE: Multiplanar, multiecho pulse sequences of the brain and surrounding structures were  obtained without intravenous contrast. COMPARISON:  Head CT same day, MRI 08/26/2013 FINDINGS: There are approximately 12 micro embolic infarctions scattered throughout the brain consistent with embolic disease from the heart or ascending aorta. Several punctate foci are scattered within the cerebellum. Other punctate foci are present within the frontoparietal cortex and deep white matter. No large vessel territory infarction. No mass lesion, hemorrhage, hydrocephalus or extra-axial collection. Chronic small-vessel ischemic changes are present throughout the brainstem and cerebral hemispheric white matter. There is generalized brain atrophy. No pituitary mass. No inflammatory sinus disease. Major vessels at the base of the brain show flow. IMPRESSION: Approximately 12 acute micro embolic infarctions scattered throughout the brain, consistent with embolic disease from the heart or ascending aorta. No large confluent infarction. No mass effect or hemorrhage. Electronically Signed   By: Nelson Chimes M.D.   On: 11/09/2015 17:30   Dg Chest Port 1 View  11/10/2015  CLINICAL DATA:  Leukocytosis EXAM: PORTABLE CHEST 1 VIEW COMPARISON:  November 09, 2015 FINDINGS: There is no edema or consolidation. Heart is normal in size and contour with pulmonary vascular within normal limits. There is a sizable hiatal type hernia. No adenopathy. There is atherosclerotic calcification in the aorta. IMPRESSION: No edema or consolidation. No change in cardiac silhouette. Sizable hiatal hernia present. Electronically Signed   By: Lowella Grip III M.D.   On: 11/10/2015 08:04   Mr Jodene Nam Head/brain Wo Cm  11/09/2015  CLINICAL DATA:  Recent diagnosis of bacterial enteritis, follow-up stroke. History of stroke, dementia, hyperlipidemia, chronic kidney disease. EXAM: MRA HEAD WITHOUT CONTRAST TECHNIQUE: Angiographic images of the Circle of Willis were obtained using MRA technique without intravenous contrast. COMPARISON:  MRI of the  brain November 09, 2015 at 1645 hours FINDINGS: Anterior circulation: Normal flow related enhancement of the included cervical, petrous, cavernous and supraclinoid internal carotid arteries. Patent anterior communicating artery. Flow related enhancement of the anterior and middle cerebral arteries, including distal segments. Mild stenosis RIGHT M1 and M2 segments. No large vessel occlusion, high-grade stenosis, abnormal luminal irregularity, aneurysm. Posterior circulation: RIGHT vertebral artery is dominant. Basilar artery is patent, with normal flow related enhancement of the main branch vessels. Normal flow related enhancement of the posterior cerebral arteries. No large vessel occlusion, high-grade stenosis, abnormal luminal irregularity, aneurysm. IMPRESSION: No acute large vessel or high-grade stenosis. Mild stenosis RIGHT M1 and M2 segments most compatible with atherosclerosis. Electronically Signed   By: Elon Alas M.D.   On: 11/09/2015 22:46    Scheduled Meds: .  stroke: mapping our early stages of recovery book   Does not apply Once  . aspirin  300 mg Rectal Daily   Or  . aspirin  325 mg Oral Daily  . atorvastatin  10 mg Oral q1800  . enoxaparin (LOVENOX) injection  30 mg Subcutaneous Q24H  . levofloxacin (LEVAQUIN) IV  500 mg Intravenous Q48H  . memantine  28 mg Oral Daily  . sodium chloride  3 mL Intravenous Q12H   Continuous Infusions:  Antibiotics Given (last 72 hours)    Date/Time Action Medication Dose Rate   11/09/15 2103 Given   levofloxacin (LEVAQUIN) IVPB 500 mg 500 mg 100 mL/hr      Principal Problem:   Metabolic encephalopathy Active Problems:   ANEMIA   Unspecified protein-calorie malnutrition ([facility])   CKD (chronic kidney disease) stage 3, GFR 30-59 ml/min   Dementia   Leukocytosis   GERD (gastroesophageal reflux disease)   Hyperlipidemia   Hypothyroidism   History of bacterial enteritis   Urinary retention   Dehydration, moderate    Time spent: 25  min    [facility] Pager [phone]. If 7PM-7AM, please contact night-coverage at [email or web address], password [facility] 11/10/2015, 10:20 AM  LOS: 1 day

## 2015-11-10 NOTE — Progress Notes (Signed)
Utilization review completed. Isaid Salvia, RN, BSN. 

## 2015-11-10 NOTE — Progress Notes (Signed)
STROKE TEAM PROGRESS NOTE   HISTORY QUINNLEE WILLET is a 81 y.o. female with a history of dementia at baseline sometimes recognizes her is her daughter and sometimes does not he was seen to have an increase in altered mental status and question of right facial droop at the nursing home and was therefore transferred to [facility] cone. Here, the daughter states that when she saw her in the emergency room she was actually doing reasonably well. Workup was undertaken which shows multifocal embolic infarcts and therefore neurology has been further consulted. Her last known well is unknown. Patient was not administered TPA secondary to unknown last known well. She was admitted for further evaluation and treatment.   SUBJECTIVE (INTERVAL HISTORY) No family is at the bedside. Pt seems abulic, little facial expression, limited language outpt, lack of appropriate response to questions and commands. Pt is NH resident with underlying dementia.    OBJECTIVE Temp:  [97.9 F (36.6 C)-99.1 F (37.3 C)] 97.9 F (36.6 C) (12/30 0441) Pulse Rate:  [78-85] 81 (12/30 0441) Cardiac Rhythm:  [-] Normal sinus rhythm (12/30 0700) Resp:  [16-22] 18 (12/30 0441) BP: (118-146)/(50-65) 142/57 mmHg (12/30 0441) SpO2:  [97 %-99 %] 98 % (12/30 0441) Weight:  [53.8 kg (118 lb 9.7 oz)-54.2 kg (119 lb 7.8 oz)] 53.8 kg (118 lb 9.7 oz) (12/30 0441)  CBC:   Recent Labs Lab 11/09/15 1446 11/10/15 0247  WBC 15.0* 13.2*  NEUTROABS 12.9*  --   HGB 11.4* 9.8*  HCT 35.3* 29.6*  MCV 97.8 97.7  PLT 171 142*    Basic Metabolic Panel:   Recent Labs Lab 11/09/15 1446 11/10/15 0247  NA 143 141  K 3.9 3.8  CL 108 114*  CO2 22 18*  GLUCOSE 122* 81  BUN 23* 18  CREATININE 1.38* 1.16*  CALCIUM 8.9 8.0*    Lipid Panel:     Component Value Date/Time   CHOL 78 11/10/2015 0247   TRIG 37 11/10/2015 0247   HDL 38* 11/10/2015 0247   CHOLHDL 2.1 11/10/2015 0247   VLDL 7 11/10/2015 0247   LDLCALC 33 11/10/2015 0247    HgbA1c:  Lab Results  Component Value Date   HGBA1C  10/05/2009    6.1 (NOTE) The ADA recommends the following therapeutic goal for glycemic control related to Hgb A1c measurement: Goal of therapy: <6.5 Hgb A1c  Reference: American Diabetes Association: Clinical Practice Recommendations 2010, Diabetes Care, 2010, 33: (Suppl  1).   Urine Drug Screen: No results found for: LABOPIA, COCAINSCRNUR, LABBENZ, AMPHETMU, THCU, LABBARB    IMAGING I have personally reviewed the radiological images below and agree with the radiology interpretations.  Dg Chest 2 View 11/09/2015   Compression fracture of T10, new since 2014. It is unchanged since 10/27/2015 no better seen on today's study because of the lateral view. Chronic large hiatal hernia. Stable cyst on the right hemidiaphragm.   Ct Head Wo Contrast 11/09/2015   Atrophy with minimal small vessel chronic ischemic changes of deep cerebral white matter. No acute intracranial abnormalities.   Mr Brain Wo Contrast (neuro Protocol) 11/09/2015  Approximately 12 acute micro embolic infarctions scattered throughout the brain, consistent with embolic disease from the heart or ascending aorta. No large confluent infarction. No mass effect or hemorrhage.   Mr Jodene Nam Head/brain Wo Cm 11/09/2015   No acute large vessel or high-grade stenosis. Mild stenosis RIGHT M1 and M2 segments most compatible with atherosclerosis.   EEG - pending  CUS - Bilateral: 1-39% ICA stenosis.  Vertebral artery flow is antegrade.  LE venous doppler - negative for DVT  2D echo - - Left ventricle: The cavity size was mildly dilated. Wall thickness was increased in a pattern of moderate LVH. Systolic function was normal. The estimated ejection fraction was in the range of 55% to 60%. Wall motion was normal; there were no regional wall motion abnormalities. Doppler parameters are consistent with abnormal left ventricular relaxation (grade 1 diastolic  dysfunction). - Aortic valve: There was trivial regurgitation. - Atrial septum: No defect or patent foramen ovale was identified.   PHYSICAL EXAM  Temp:  [97.9 F (36.6 C)-99.5 F (37.5 C)] 99.5 F (37.5 C) (12/30 1237) Pulse Rate:  [78-85] 81 (12/30 1237) Resp:  [18] 18 (12/30 1237) BP: (92-146)/(55-65) 92/65 mmHg (12/30 1237) SpO2:  [98 %-99 %] 98 % (12/30 1237) Weight:  [118 lb 9.7 oz (53.8 kg)-119 lb 7.8 oz (54.2 kg)] 118 lb 9.7 oz (53.8 kg) (12/30 0441)  General - Well nourished, well developed, in no apparent distress, but abulia and near mutism.  Ophthalmologic - Fundi not visualized due to noncooperation.  Cardiovascular - Regular rate and rhythm.  Neuro - awake, not quite alert, orientated to place and her name but not to time or age or people. Paucity of speech, intermittently following some simple commands. Naming 2/3, repeat OK but slow, PERRL, EOMI, facial symmetrical, tongue in middle, moving all extremities. Sensation symmetrical. Not cooperative on coordination test. Gait not tested.  ASSESSMENT/PLAN Ms. ADDIELYNN KLEINBERG is a 81 y.o. female with history of dementia presenting with right facial droop from nursing home. She did not receive IV t-PA due to unknown last known well.   Stroke:  Bilateral anterior and posterior territory tiny embolic infarcts secondary to unknown source.  MRI  bilateral tiny anterior and posterior territory infarcts  MRA  No large vessel stenosis. Mild R M1 and M2 stenosis  Carotid Doppler  unremarkable   2D Echo  EF 55-60%   Lower extremity venous Dopplers negative for DVT  EEG pending   LDL 33  HgbA1c pending  Lovenox 30 mg sq daily for VTE prophylaxis Diet regular Room service appropriate?: Yes; Fluid consistency:: Thin  No antithrombotic prior to admission, now on aspirin 325 mg daily. Continue ASA on discharge.  Patient counseled to be compliant with her antithrombotic medications  Recommend outpt 30 day monitoring  to look for atrial fibrillation as source of stroke  Ongoing aggressive stroke risk factor management  Therapy recommendations:  pending   Disposition:  pending (from [place], ? Memory unit)  Hypertension  No history of hypertension   Mildly elevated Permissive hypertension (OK if < 220/120) but gradually normalize in 5-7 days  Hyperlipidemia  Home meds:  Lipitor 10,  Reordered in hospital  LDL 33, goal < 70  Continue statin at discharge  Other Stroke Risk Factors  Advanced age  ETOH use  Hx stroke/TIA  09/2009 distal L ACA infarct  Other Active Problems  Baseline dementia on Namenda PTA, resumed  Leukocytosis  Urinary retention  Dehydration, moderate  Anemia  Chronic kidney disease stage III, Cr 1.16  Hypothyroidism  History of bacterial enteritis  Protein calorie malnutrition, Body mass index is 20.35 kg/(m^2).   GERD  Hospital day # 1  Rosalin Hawking, MD PhD Stroke Neurology 11/10/2015 5:23 PM   To contact Stroke Continuity provider, please refer to [email or web address] After hours, contact General Neurology

## 2015-11-10 NOTE — Progress Notes (Signed)
OT Cancellation Note  Patient Details Name: MELISE WIEDEMANN MRN: [id number] DOB: February 08, 1928   Cancelled Treatment:    Reason Eval/Treat Not Completed: OT screened, no needs identified, will sign off - Per PT, pt is long term resident of memory care unit with plan to return to long term care.  Pt appears to be at baseline, OT will sign off at this time.   Darlina Rumpf Casselman, OTR/L [id number]  11/10/2015, 11:27 AM

## 2015-11-10 NOTE — Progress Notes (Signed)
VASCULAR LAB PRELIMINARY  PRELIMINARY  PRELIMINARY  PRELIMINARY  Bilateral lower extremity venous duplex completed.    Preliminary report:  Bilateral:  No evidence of DVT, superficial thrombosis, or Baker's Cyst.   Amal Renbarger, RVS 11/10/2015, 3:57 PM

## 2015-11-10 NOTE — Progress Notes (Signed)
*  PRELIMINARY RESULTS* Echocardiogram 2D Echocardiogram has been performed.  Leavy Cella 11/10/2015, 3:30 PM

## 2015-11-10 NOTE — Progress Notes (Signed)
EEG completed; results pending.    

## 2015-11-10 NOTE — Procedures (Signed)
EEG report.  Brief clinical history:  81 y.o. female with a history of dementia at baseline sometimes recognizes her is her daughter and sometimes does not he was seen to have an increase in altered mental status and question of right facial droop at the nursing home and was therefore transferred to [facility]. Here, the daughter states that when she saw her in the emergency room she was actually doing reasonably well. Workup was undertaken which shows multifocal embolic infarcts and therefore neurology has been further consulted  Technique: this is a 17 channel routine scalp EEG performed at the bedside with bipolar and monopolar montages arranged in accordance to the international 10/20 system of electrode placement. One channel was dedicated to EKG recording.  No sleep was achieved during recording. No activating procedures performed.   Description:In the wakeful state, the best background consisted of a medium amplitude, posterior dominant, well sustained, symmetric and reactive 9 Hz rhythm. No focal or generalized epileptiform discharges noted.  No pathologic areas of slowing seen.  EKG showed sinus rhythm.  Impression: this is a normal awake EEG. Please, be aware that a normal EEG does not exclude the possibility of epilepsy.  Clinical correlation is advised.   Dorian Pod, MD [facility]

## 2015-11-11 LAB — RESPIRATORY VIRUS PANEL
Adenovirus: NEGATIVE
INFLUENZA A: NEGATIVE
Influenza B: NEGATIVE
METAPNEUMOVIRUS: NEGATIVE
PARAINFLUENZA 2 A: NEGATIVE
PARAINFLUENZA 3 A: NEGATIVE
Parainfluenza 1: NEGATIVE
RESPIRATORY SYNCYTIAL VIRUS A: NEGATIVE
RHINOVIRUS: NEGATIVE
Respiratory Syncytial Virus B: NEGATIVE

## 2015-11-11 LAB — HEMOGLOBIN A1C
HEMOGLOBIN A1C: 5.5 % (ref 4.8–5.6)
Mean Plasma Glucose: 111 mg/dL

## 2015-11-11 NOTE — NC FL2 (Signed)
[place] MEDICAID FL2 LEVEL OF CARE SCREENING TOOL     IDENTIFICATION  Patient Name: Melanie Levy Birthdate: 1927/11/26 Sex: female Admission Date (Current Location): 11/09/2015  [facility] [place] Number:  Herbalist and Address:  [facility] [place] [address] [place], [place] [address]      Provider Number: [id number]  Attending Physician Name and Address:  Geradine Girt, DO  Relative Name and Phone Number:  Eustaquio Maize, daughter, [phone]    Current Level of Care: Hospital Recommended Level of Care: [place] Prior Approval Number:    Date Approved/Denied:   PASRR Number:    Discharge Plan:  [facility])    Current Diagnoses: Patient Active Problem List   Diagnosis Date Noted  . Altered mental status   . Embolic stroke ([place])   . Metabolic encephalopathy 99991111  . Dehydration, moderate 11/09/2015  . Urinary retention 10/20/2015  . History of bacterial enteritis 10/18/2015  . Abnormal CT scan, small bowel 10/14/2015  . CKD (chronic kidney disease) stage 3, GFR 30-59 ml/min 10/14/2015  . Dementia 10/14/2015  . Leukocytosis 10/14/2015  . GERD (gastroesophageal reflux disease) 10/14/2015  . Hypothyroidism 10/14/2015  . Hyperlipidemia   . Gait instability 08/27/2013    Class: Acute  . Unspecified protein-calorie malnutrition ([place]) 08/27/2013    Class: Chronic  . Multiple skin tears 08/27/2013    Class: Acute  . ANEMIA 03/12/2008  . DEPRESSION/ANXIETY 03/12/2008  . ASTHMA 03/12/2008  . ESOPHAGEAL STRICTURE 03/12/2008  . HIATAL HERNIA 03/12/2008  . DIVERTICULOSIS, COLON 03/12/2008  . ARTHRITIS 03/12/2008    Orientation RESPIRATION BLADDER Height & Weight    Self, Place  Normal Indwelling catheter, Incontinent (Urinary catheter) 5\' 4"  (162.6 cm) 118 lbs.  BEHAVIORAL SYMPTOMS/MOOD NEUROLOGICAL BOWEL NUTRITION STATUS     (N/A) Continent  (Soft diet. Please see DC summary.)  AMBULATORY  STATUS COMMUNICATION OF NEEDS Skin   Extensive Assist Verbally Normal                       Personal Care Assistance Level of Assistance  Bathing, Feeding, Dressing Bathing Assistance: Maximum assistance Feeding assistance: Limited assistance Dressing Assistance: Maximum assistance     Functional Limitations Info  Sight Sight Info: Impaired        SPECIAL CARE FACTORS FREQUENCY                       Contractures      Additional Factors Info  Code Status, Allergies, Psychotropic Code Status Info: Full Allergies Info: Iohexol, Milk-related Compounds, Nexium, Penicillins Psychotropic Info: Prozac, Ativan, Remeron         Current Medications (11/11/2015):  This is the current hospital active medication list Current Facility-Administered Medications  Medication Dose Route Frequency Provider Last Rate Last Dose  .  stroke: mapping our early stages of recovery book   Does not apply Once Samella Parr, NP      . acetaminophen (TYLENOL) tablet 650 mg  650 mg Oral Q6H PRN Samella Parr, NP       Or  . acetaminophen (TYLENOL) suppository 650 mg  650 mg Rectal Q6H PRN Samella Parr, NP      . aspirin suppository 300 mg  300 mg Rectal Daily Samella Parr, NP   300 mg at 11/09/15 2000   Or  . aspirin tablet 325 mg  325 mg Oral Daily Samella Parr, NP  325 mg at 11/11/15 1028  . atorvastatin (LIPITOR) tablet 10 mg  10 mg Oral q1800 Donzetta Starch, NP   10 mg at 11/10/15 1815  . enoxaparin (LOVENOX) injection 30 mg  30 mg Subcutaneous Q24H Samella Parr, NP   30 mg at 11/10/15 1820  . levofloxacin (LEVAQUIN) tablet 250 mg  250 mg Oral q1800 Eudelia Bunch, RPH   250 mg at 11/10/15 1816  . memantine (NAMENDA XR) 24 hr capsule 28 mg  28 mg Oral Daily Donzetta Starch, NP   28 mg at 11/11/15 1028  . ondansetron (ZOFRAN) tablet 4 mg  4 mg Oral Q6H PRN Samella Parr, NP       Or  . ondansetron [facility]) injection 4 mg  4 mg Intravenous Q6H PRN Samella Parr, NP       . sodium chloride 0.9 % injection 3 mL  3 mL Intravenous Q12H Samella Parr, NP   3 mL at 11/11/15 1033     Discharge Medications: Please see discharge summary for a list of discharge medications.  Relevant Imaging Results:  Relevant Lab Results:   Additional Information SS#: [id number]  Cranford Mon, [place]

## 2015-11-11 NOTE — Clinical Social Work Placement (Signed)
   CLINICAL SOCIAL WORK PLACEMENT  NOTE  Date:  11/11/2015  Patient Details  Name: Melanie Levy MRN: [id number] Date of Birth: 11/23/1927  Clinical Social Work is seeking post-discharge placement for this patient at the [place] level of care (*CSW will initial, date and re-position this form in  chart as items are completed):  Yes   Patient/family provided with [place] Work Department's list of facilities offering this level of care within the geographic area requested by the patient (or if unable, by the patient's family).  Yes   Patient/family informed of their freedom to choose among providers that offer the needed level of care, that participate in Medicare, Medicaid or managed care program needed by the patient, have an available bed and are willing to accept the patient.  Yes   Patient/family informed of [place]'s ownership interest in [facility] and [facility], as well as of the fact that they are under no obligation to receive care at these facilities.  PASRR submitted to EDS on       PASRR number received on       Existing PASRR number confirmed on       FL2 transmitted to all facilities in geographic area requested by pt/family on 11/11/15     FL2 transmitted to all facilities within larger geographic area on       Patient informed that his/her managed care company has contracts with or will negotiate with certain facilities, including the following:            Patient/family informed of bed offers received.  Patient chooses bed at       Physician recommends and patient chooses bed at      Patient to be transferred to   on  .  Patient to be transferred to facility by       Patient family notified on   of transfer.  Name of family member notified:        PHYSICIAN Please sign FL2     Additional Comment:    _______________________________________________ Cranford Mon, LCSW 11/11/2015, 12:22 PM

## 2015-11-11 NOTE — Progress Notes (Signed)
PROGRESS NOTE  Melanie Levy [id number] DOB: [birth year] DOA: 11/09/2015 PCP: Donnajean Lopes, MD  Assessment/Plan: Metabolic encephalopathy -UTI vs CVA -patient more talkative today -appears mentally to be back to baseline  CVA: -12 acute micro embolic infarctions scattered throughout the brain -echo/carotid- ok -will need outpatient 30 day event monitor- called in to [facility] on 12/30   Leukocytosis -await culture from urine - influenza PCR panel negative   Urinary retention -Issue during previous hospitalization requiring Foley catheter which was continued upon discharge -foley removed at [facility] but reinserted later after she had retention issues -plan for voiding trial on Tuesday at [facility] D/c abx in AM   Dehydration, moderate -Continue gentle IV fluid hydration -Follow labs   ANEMIA -Hemoglobin stable and at baseline   CKD stage 3, GFR 30-59 ml/min -Renal function stable and at baseline   Hypothyroidism -Continue preadmission Synthroid   History of bacterial enteritis -Completed full course of Cipro and Flagyl   Unspecified protein-calorie malnutrition /Dementia -Once diet resumed will need formal nutrition evaluation   GERD  -Apparent history of previous esophageal stricture -May require formal speech therapy evaluation regarding swallowing   Hyperlipidemia   Code Status: full Family Communication: LM for daughter 12/31-- spoke with her 12/30 on phone x 2 Disposition Plan: most likely will need SNF   Consultants:  neuro  Procedures:     HPI/Subjective: More awake and talking today  Objective: Filed Vitals:   11/10/15 2038 11/11/15 0425  BP: 138/95 146/68  Pulse: 85 72  Temp: 99 F (37.2 C) 98 F (36.7 C)  Resp: 18 18    Intake/Output Summary (Last 24 hours) at 11/11/15 1314 Last data filed at 11/11/15 1102  Gross per 24 hour  Intake    120 ml  Output   1000 ml  Net   -880 ml   Filed Weights   11/09/15  2032 11/10/15 0441 11/11/15 0425  Weight: 54.2 kg (119 lb 7.8 oz) 53.8 kg (118 lb 9.7 oz) 54.6 kg (120 lb 5.9 oz)    Exam:   General:  Awake, not speaking currently  Cardiovascular: rrr  Respiratory: clear  Abdomen: +BS, soft  Musculoskeletal: no edema   Data Reviewed: Basic Metabolic Panel:  Recent Labs Lab 11/09/15 1446 11/10/15 0247  NA 143 141  K 3.9 3.8  CL 108 114*  CO2 22 18*  GLUCOSE 122* 81  BUN 23* 18  CREATININE 1.38* 1.16*  CALCIUM 8.9 8.0*   Liver Function Tests:  Recent Labs Lab 11/09/15 1446  AST 24  ALT 20  ALKPHOS 92  BILITOT 0.3  PROT 5.9*  ALBUMIN 3.3*   No results for input(s): LIPASE, AMYLASE in the last 168 hours. No results for input(s): AMMONIA in the last 168 hours. CBC:  Recent Labs Lab 11/09/15 1446 11/10/15 0247  WBC 15.0* 13.2*  NEUTROABS 12.9*  --   HGB 11.4* 9.8*  HCT 35.3* 29.6*  MCV 97.8 97.7  PLT 171 142*   Cardiac Enzymes: No results for input(s): CKTOTAL, CKMB, CKMBINDEX, TROPONINI in the last 168 hours. BNP (last 3 results) No results for input(s): BNP in the last 8760 hours.  ProBNP (last 3 results) No results for input(s): PROBNP in the last 8760 hours.  CBG: No results for input(s): GLUCAP in the last 168 hours.  Recent Results (from the past 240 hour(s))  Urine culture     Status: None   Collection Time: 11/09/15  3:57 PM  Result Value Ref Range Status   Specimen Description  URINE, RANDOM  Final   Special Requests NONE  Final   Culture MULTIPLE SPECIES PRESENT, SUGGEST RECOLLECTION  Final   Report Status 11/10/2015 FINAL  Final  Culture, blood (Routine X 2) w Reflex to ID Panel     Status: None (Preliminary result)   Collection Time: 11/09/15  7:25 PM  Result Value Ref Range Status   Specimen Description BLOOD LEFT ANTECUBITAL  Final   Special Requests BOTTLES DRAWN AEROBIC AND ANAEROBIC 5CC EA  Final   Culture NO GROWTH 2 DAYS  Final   Report Status PENDING  Incomplete  Culture, blood  (Routine X 2) w Reflex to ID Panel     Status: None (Preliminary result)   Collection Time: 11/09/15  7:34 PM  Result Value Ref Range Status   Specimen Description BLOOD RIGHT HAND  Final   Special Requests BOTTLES DRAWN AEROBIC AND ANAEROBIC 5CC EA  Final   Culture NO GROWTH 2 DAYS  Final   Report Status PENDING  Incomplete  Respiratory virus panel     Status: None   Collection Time: 11/09/15 10:00 PM  Result Value Ref Range Status   Respiratory Syncytial Virus A Negative Negative Final   Respiratory Syncytial Virus B Negative Negative Final   Influenza A Negative Negative Final   Influenza B Negative Negative Final   Parainfluenza 1 Negative Negative Final   Parainfluenza 2 Negative Negative Final   Parainfluenza 3 Negative Negative Final   Metapneumovirus Negative Negative Final   Rhinovirus Negative Negative Final   Adenovirus Negative Negative Final    Comment: (NOTE) Performed At: [facility] [place], [place] [id number] Lindon Romp MD [id number]      Studies: Dg Chest 2 View  11/09/2015  CLINICAL DATA:  Altered mental status. Right facial droop. Weakness. EXAM: CHEST  2 VIEW COMPARISON:  Chest x-rays dated 10/27/2015 and 08/24/2013 and CT scan of the chest dated 03/19/2006 FINDINGS: Heart size and pulmonary vascularity are normal and the lungs are clear. Large hiatal hernia. Stable cyst on the lateral aspect of the right hemidiaphragm, unchanged since 2007. There is a compression fracture of the superior aspect of T10, new since 08/24/2013. IMPRESSION: Compression fracture of T10, new since 2014. It is unchanged since 10/27/2015 no better seen on today's study because of the lateral view. Chronic large hiatal hernia. Stable cyst on the right hemidiaphragm. Electronically Signed   By: Lorriane Shire M.D.   On: 11/09/2015 15:09   Ct Head Wo Contrast  11/09/2015  CLINICAL DATA:  Altered mental status, lethargy today, RIGHT-side facial droop,  leaning to RIGHT side, weakness, unknown when last seen normal EXAM: CT HEAD WITHOUT CONTRAST TECHNIQUE: Contiguous axial images were obtained from the base of the skull through the vertex without intravenous contrast. COMPARISON:  10/28/2015 FINDINGS: Asymmetric positioning in gantry. Generalized atrophy with ex vacuo dilatation of the ventricular system. No midline shift or mass effect. Minimal small vessel chronic ischemic changes of deep cerebral white matter. No intracranial hemorrhage, mass lesion or evidence acute infarction. Benign-appearing basal ganglia calcifications again identified. No extra-axial fluid collections. Question small mucosal retention cyst within the RIGHT maxillary sinus. Remaining bones and sinuses unremarkable. IMPRESSION: Atrophy with minimal small vessel chronic ischemic changes of deep cerebral white matter. No acute intracranial abnormalities. Electronically Signed   By: Lavonia Dana M.D.   On: 11/09/2015 15:17   Mr Brain Wo Contrast (neuro Protocol)  11/09/2015  CLINICAL DATA:  Acute presentation with altered mental status. Lethargy, right facial  drooping confusion. EXAM: MRI HEAD WITHOUT CONTRAST TECHNIQUE: Multiplanar, multiecho pulse sequences of the brain and surrounding structures were obtained without intravenous contrast. COMPARISON:  Head CT same day, MRI 08/26/2013 FINDINGS: There are approximately 12 micro embolic infarctions scattered throughout the brain consistent with embolic disease from the heart or ascending aorta. Several punctate foci are scattered within the cerebellum. Other punctate foci are present within the frontoparietal cortex and deep white matter. No large vessel territory infarction. No mass lesion, hemorrhage, hydrocephalus or extra-axial collection. Chronic small-vessel ischemic changes are present throughout the brainstem and cerebral hemispheric white matter. There is generalized brain atrophy. No pituitary mass. No inflammatory sinus disease.  Major vessels at the base of the brain show flow. IMPRESSION: Approximately 12 acute micro embolic infarctions scattered throughout the brain, consistent with embolic disease from the heart or ascending aorta. No large confluent infarction. No mass effect or hemorrhage. Electronically Signed   By: Nelson Chimes M.D.   On: 11/09/2015 17:30   Dg Chest Port 1 View  11/10/2015  CLINICAL DATA:  Leukocytosis EXAM: PORTABLE CHEST 1 VIEW COMPARISON:  November 09, 2015 FINDINGS: There is no edema or consolidation. Heart is normal in size and contour with pulmonary vascular within normal limits. There is a sizable hiatal type hernia. No adenopathy. There is atherosclerotic calcification in the aorta. IMPRESSION: No edema or consolidation. No change in cardiac silhouette. Sizable hiatal hernia present. Electronically Signed   By: Lowella Grip III M.D.   On: 11/10/2015 08:04   Mr Jodene Nam Head/brain Wo Cm  11/09/2015  CLINICAL DATA:  Recent diagnosis of bacterial enteritis, follow-up stroke. History of stroke, dementia, hyperlipidemia, chronic kidney disease. EXAM: MRA HEAD WITHOUT CONTRAST TECHNIQUE: Angiographic images of the Circle of Willis were obtained using MRA technique without intravenous contrast. COMPARISON:  MRI of the brain November 09, 2015 at 1645 hours FINDINGS: Anterior circulation: Normal flow related enhancement of the included cervical, petrous, cavernous and supraclinoid internal carotid arteries. Patent anterior communicating artery. Flow related enhancement of the anterior and middle cerebral arteries, including distal segments. Mild stenosis RIGHT M1 and M2 segments. No large vessel occlusion, high-grade stenosis, abnormal luminal irregularity, aneurysm. Posterior circulation: RIGHT vertebral artery is dominant. Basilar artery is patent, with normal flow related enhancement of the main branch vessels. Normal flow related enhancement of the posterior cerebral arteries. No large vessel occlusion,  high-grade stenosis, abnormal luminal irregularity, aneurysm. IMPRESSION: No acute large vessel or high-grade stenosis. Mild stenosis RIGHT M1 and M2 segments most compatible with atherosclerosis. Electronically Signed   By: Elon Alas M.D.   On: 11/09/2015 22:46    Scheduled Meds: .  stroke: mapping our early stages of recovery book   Does not apply Once  . aspirin  300 mg Rectal Daily   Or  . aspirin  325 mg Oral Daily  . atorvastatin  10 mg Oral q1800  . enoxaparin (LOVENOX) injection  30 mg Subcutaneous Q24H  . levofloxacin  250 mg Oral q1800  . memantine  28 mg Oral Daily  . sodium chloride  3 mL Intravenous Q12H   Continuous Infusions:  Antibiotics Given (last 72 hours)    Date/Time Action Medication Dose Rate   11/09/15 2103 Given   levofloxacin (LEVAQUIN) IVPB 500 mg 500 mg 100 mL/hr   11/10/15 1816 Given   levofloxacin (LEVAQUIN) tablet 250 mg 250 mg       Principal Problem:   Metabolic encephalopathy Active Problems:   ANEMIA   Unspecified protein-calorie malnutrition ([place])  CKD (chronic kidney disease) stage 3, GFR 30-59 ml/min   Dementia   Leukocytosis   GERD (gastroesophageal reflux disease)   Hyperlipidemia   Hypothyroidism   History of bacterial enteritis   Urinary retention   Dehydration, moderate   Altered mental status   Embolic stroke ([place])    Time spent: 25 min    [facility] Pager [phone]. If 7PM-7AM, please contact night-coverage at [email or web address], password [facility] 11/11/2015, 1:14 PM  LOS: 2 days

## 2015-11-11 NOTE — Progress Notes (Signed)
STROKE TEAM PROGRESS NOTE   SUBJECTIVE (INTERVAL HISTORY) No family is at the bedside. Pt seems more interactive than yesterday but still flat affect, with little facial expression, limited language outpt. EEG normal.   OBJECTIVE Temp:  [98 F (36.7 C)-99.2 F (37.3 C)] 99.2 F (37.3 C) (12/31 1527) Pulse Rate:  [72-110] 110 (12/31 1527) Cardiac Rhythm:  [-] Normal sinus rhythm (12/31 0701) Resp:  [18] 18 (12/31 1527) BP: (138-153)/(68-95) 153/69 mmHg (12/31 1527) SpO2:  [97 %-98 %] 98 % (12/31 1527) Weight:  [120 lb 5.9 oz (54.6 kg)] 120 lb 5.9 oz (54.6 kg) (12/31 0425)  CBC:   Recent Labs Lab 11/09/15 1446 11/10/15 0247  WBC 15.0* 13.2*  NEUTROABS 12.9*  --   HGB 11.4* 9.8*  HCT 35.3* 29.6*  MCV 97.8 97.7  PLT 171 142*    Basic Metabolic Panel:   Recent Labs Lab 11/09/15 1446 11/10/15 0247  NA 143 141  K 3.9 3.8  CL 108 114*  CO2 22 18*  GLUCOSE 122* 81  BUN 23* 18  CREATININE 1.38* 1.16*  CALCIUM 8.9 8.0*    Lipid Panel:     Component Value Date/Time   CHOL 78 11/10/2015 0247   TRIG 37 11/10/2015 0247   HDL 38* 11/10/2015 0247   CHOLHDL 2.1 11/10/2015 0247   VLDL 7 11/10/2015 0247   LDLCALC 33 11/10/2015 0247   HgbA1c:  Lab Results  Component Value Date   HGBA1C 5.5 11/10/2015   Urine Drug Screen: No results found for: LABOPIA, COCAINSCRNUR, LABBENZ, AMPHETMU, THCU, LABBARB    IMAGING I have personally reviewed the radiological images below and agree with the radiology interpretations.  Dg Chest 2 View 11/09/2015   Compression fracture of T10, new since 2014. It is unchanged since 10/27/2015 no better seen on today's study because of the lateral view. Chronic large hiatal hernia. Stable cyst on the right hemidiaphragm.   Ct Head Wo Contrast 11/09/2015   Atrophy with minimal small vessel chronic ischemic changes of deep cerebral white matter. No acute intracranial abnormalities.   Mr Brain Wo Contrast (neuro Protocol) 11/09/2015   Approximately 12 acute micro embolic infarctions scattered throughout the brain, consistent with embolic disease from the heart or ascending aorta. No large confluent infarction. No mass effect or hemorrhage.   Mr Jodene Nam Head/brain Wo Cm 11/09/2015   No acute large vessel or high-grade stenosis. Mild stenosis RIGHT M1 and M2 segments most compatible with atherosclerosis.   EEG - Impression: this is a normal awake EEG. Please, be aware that a normal EEG does not exclude the possibility of epilepsy.   CUS - Bilateral: 1-39% ICA stenosis. Vertebral artery flow is antegrade.  LE venous doppler - negative for DVT  2D echo - - Left ventricle: The cavity size was mildly dilated. Wall thickness was increased in a pattern of moderate LVH. Systolic function was normal. The estimated ejection fraction was in the range of 55% to 60%. Wall motion was normal; there were no regional wall motion abnormalities. Doppler parameters are consistent with abnormal left ventricular relaxation (grade 1 diastolic dysfunction). - Aortic valve: There was trivial regurgitation. - Atrial septum: No defect or patent foramen ovale was identified.   PHYSICAL EXAM  Temp:  [98 F (36.7 C)-99.2 F (37.3 C)] 99.2 F (37.3 C) (12/31 1527) Pulse Rate:  [72-110] 110 (12/31 1527) Resp:  [18] 18 (12/31 1527) BP: (138-153)/(68-95) 153/69 mmHg (12/31 1527) SpO2:  [97 %-98 %] 98 % (12/31 1527) Weight:  [120 lb 5.9  oz (54.6 kg)] 120 lb 5.9 oz (54.6 kg) (12/31 0425)  General - Well nourished, well developed, in no apparent distress, but abulia and near mutism.  Ophthalmologic - Fundi not visualized due to noncooperation.  Cardiovascular - Regular rate and rhythm.  Neuro - awake, not quite alert, orientated to place and her name but not to time or age or people. Paucity of speech, intermittently following some simple commands. Naming 2/3, repeat OK but slow, PERRL, EOMI, facial symmetrical, tongue in middle,  moving all extremities. Sensation symmetrical. Not cooperative on coordination test. Gait not tested.  ASSESSMENT/PLAN Melanie Levy is a 81 y.o. female with history of dementia presenting with right facial droop from nursing home. She did not receive IV t-PA due to unknown last known well.   Stroke:  Bilateral anterior and posterior territory tiny embolic infarcts secondary to unknown source.  MRI  bilateral tiny anterior and posterior territory infarcts  MRA  No large vessel stenosis. Mild R M1 and M2 stenosis  Carotid Doppler  unremarkable   2D Echo  EF 55-60%   Lower extremity venous Dopplers negative for DVT  EEG normal  LDL 33  HgbA1c 5.5  Lovenox 30 mg sq daily for VTE prophylaxis Diet regular Room service appropriate?: Yes; Fluid consistency:: Thin  No antithrombotic prior to admission, now on aspirin 325 mg daily. Continue ASA on discharge.  Patient counseled to be compliant with her antithrombotic medications  Recommend outpt 30 day monitoring to look for atrial fibrillation as source of stroke  Ongoing aggressive stroke risk factor management  Therapy recommendations:  pending   Disposition:  pending (from [facility], ? Memory unit)  Hypertension  No history of hypertension   Mildly elevated Permissive hypertension (OK if < 220/120) but gradually normalize in 5-7 days  Hyperlipidemia  Home meds:  Lipitor 10,  Reordered in hospital  LDL 33, goal < 70  Continue statin at discharge  Other Stroke Risk Factors  Advanced age  ETOH use  Hx stroke/TIA  09/2009 distal L ACA infarct  Other Active Problems  Baseline dementia on [place] PTA, resumed  Leukocytosis  Urinary retention  Dehydration, moderate  Anemia  Chronic kidney disease stage III, Cr 1.16  Hypothyroidism  History of bacterial enteritis  Protein calorie malnutrition, Body mass index is 20.65 kg/(m^2).   GERD  Hospital day # 2  Neurology will  sign off. Please call with questions. Pt will follow up with Dr. Erlinda Levy at [facility] in about 2 months. Thanks for the consult.  Melanie Hawking, MD PhD Stroke Neurology 11/11/2015 4:41 PM   To contact Stroke Continuity provider, please refer to [email or web address] After hours, contact General Neurology

## 2015-11-11 NOTE — Progress Notes (Signed)
CSW spoke with pt facility- [facility]- they report pt is somewhat below baseline functioning- they would like for pt to get short term rehab prior to return home  CSW informed pt dtr- she will follow up with facility about possible return but is agreeable to looking at [facility] for short term if needed.  CSW placed list of SNF and ALF facilities at pt bedside for dtr review.  CSW will continue to follow for SNF vs return to [facility], [place] Social Worker [phone]

## 2015-11-12 LAB — BASIC METABOLIC PANEL
Anion gap: 8 (ref 5–15)
BUN: 20 mg/dL (ref 6–20)
CHLORIDE: 113 mmol/L — AB (ref 101–111)
CO2: 20 mmol/L — AB (ref 22–32)
Calcium: 8.6 mg/dL — ABNORMAL LOW (ref 8.9–10.3)
Creatinine, Ser: 1.21 mg/dL — ABNORMAL HIGH (ref 0.44–1.00)
GFR calc Af Amer: 46 mL/min — ABNORMAL LOW (ref >60)
GFR calc non Af Amer: 40 mL/min — ABNORMAL LOW (ref >60)
GLUCOSE: 87 mg/dL (ref 65–99)
POTASSIUM: 4.2 mmol/L (ref 3.5–5.1)
Sodium: 141 mmol/L (ref 135–145)

## 2015-11-12 LAB — CBC
HCT: 32.4 % — ABNORMAL LOW (ref 36.0–46.0)
HEMOGLOBIN: 10.7 g/dL — AB (ref 12.0–15.0)
MCH: 32 pg (ref 26.0–34.0)
MCHC: 33 g/dL (ref 30.0–36.0)
MCV: 97 fL (ref 78.0–100.0)
PLATELETS: 188 10*3/uL (ref 150–400)
RBC: 3.34 MIL/uL — AB (ref 3.87–5.11)
RDW: 13.7 % (ref 11.5–15.5)
WBC: 6.2 10*3/uL (ref 4.0–10.5)

## 2015-11-12 NOTE — Progress Notes (Signed)
PROGRESS NOTE  Melanie Levy [id number] DOB: January 11, 1928 DOA: 11/09/2015 PCP: Donnajean Lopes, MD  Assessment/Plan: Metabolic encephalopathy -UTI vs CVA -patient more talkative today -appears mentally to be back to baseline  CVA: -12 acute micro embolic infarctions scattered throughout the brain -echo/carotid- ok -will need outpatient 30 day event monitor- called in to [facility] on 12/30 - follow up with neuro 2 months   Leukocytosis -await culture from urine - influenza PCR panel negative   Urinary retention -Issue during previous hospitalization requiring Foley catheter which was continued upon discharge -foley removed at [facility] but reinserted later after she had retention issues D/c abx after 7 days   Dehydration, moderate -s/p IVF   ANEMIA -Hemoglobin stable and at baseline   CKD stage 3, GFR 30-59 ml/min -Renal function stable and at baseline   Hypothyroidism -Continue preadmission Synthroid   History of bacterial enteritis -Completed full course of Cipro and Flagyl   Unspecified protein-calorie malnutrition /Dementia -Once diet resumed will need formal nutrition evaluation   GERD  -Apparent history of previous esophageal stricture -May require formal speech therapy evaluation regarding swallowing   Hyperlipidemia   Code Status: full Family Communication: daughter 12/31- plan to meet today at 3 Disposition Plan: most likely will need SNF? Vs aLF   Consultants:  neuro  Procedures:     HPI/Subjective: No overnight events  Objective: Filed Vitals:   11/12/15 0532 11/12/15 1323  BP: 141/47 135/65  Pulse: 68 85  Temp: 99 F (37.2 C) 98.4 F (36.9 C)  Resp: 18 18    Intake/Output Summary (Last 24 hours) at 11/12/15 1327 Last data filed at 11/12/15 0902  Gross per 24 hour  Intake    240 ml  Output    575 ml  Net   -335 ml   Filed Weights   11/09/15 2032 11/10/15 0441 11/11/15 0425  Weight: 54.2 kg (119 lb 7.8 oz)  53.8 kg (118 lb 9.7 oz) 54.6 kg (120 lb 5.9 oz)    Exam:   General:  Awake, says "i'm fine"  Cardiovascular: rrr  Respiratory: clear  Abdomen: +BS, soft  Musculoskeletal: no edema   Data Reviewed: Basic Metabolic Panel:  Recent Labs Lab 11/09/15 1446 11/10/15 0247 11/12/15 0447  NA 143 141 141  K 3.9 3.8 4.2  CL 108 114* 113*  CO2 22 18* 20*  GLUCOSE 122* 81 87  BUN 23* 18 20  CREATININE 1.38* 1.16* 1.21*  CALCIUM 8.9 8.0* 8.6*   Liver Function Tests:  Recent Labs Lab 11/09/15 1446  AST 24  ALT 20  ALKPHOS 92  BILITOT 0.3  PROT 5.9*  ALBUMIN 3.3*   No results for input(s): LIPASE, AMYLASE in the last 168 hours. No results for input(s): AMMONIA in the last 168 hours. CBC:  Recent Labs Lab 11/09/15 1446 11/10/15 0247 11/12/15 0447  WBC 15.0* 13.2* 6.2  NEUTROABS 12.9*  --   --   HGB 11.4* 9.8* 10.7*  HCT 35.3* 29.6* 32.4*  MCV 97.8 97.7 97.0  PLT 171 142* 188   Cardiac Enzymes: No results for input(s): CKTOTAL, CKMB, CKMBINDEX, TROPONINI in the last 168 hours. BNP (last 3 results) No results for input(s): BNP in the last 8760 hours.  ProBNP (last 3 results) No results for input(s): PROBNP in the last 8760 hours.  CBG: No results for input(s): GLUCAP in the last 168 hours.  Recent Results (from the past 240 hour(s))  Urine culture     Status: None   Collection Time: 11/09/15  3:57 PM  Result Value Ref Range Status   Specimen Description URINE, RANDOM  Final   Special Requests NONE  Final   Culture MULTIPLE SPECIES PRESENT, SUGGEST RECOLLECTION  Final   Report Status 11/10/2015 FINAL  Final  Culture, blood (Routine X 2) w Reflex to ID Panel     Status: None (Preliminary result)   Collection Time: 11/09/15  7:25 PM  Result Value Ref Range Status   Specimen Description BLOOD LEFT ANTECUBITAL  Final   Special Requests BOTTLES DRAWN AEROBIC AND ANAEROBIC 5CC EA  Final   Culture NO GROWTH 2 DAYS  Final   Report Status PENDING  Incomplete   Culture, blood (Routine X 2) w Reflex to ID Panel     Status: None (Preliminary result)   Collection Time: 11/09/15  7:34 PM  Result Value Ref Range Status   Specimen Description BLOOD RIGHT HAND  Final   Special Requests BOTTLES DRAWN AEROBIC AND ANAEROBIC 5CC EA  Final   Culture NO GROWTH 2 DAYS  Final   Report Status PENDING  Incomplete  Respiratory virus panel     Status: None   Collection Time: 11/09/15 10:00 PM  Result Value Ref Range Status   Respiratory Syncytial Virus A Negative Negative Final   Respiratory Syncytial Virus B Negative Negative Final   Influenza A Negative Negative Final   Influenza B Negative Negative Final   Parainfluenza 1 Negative Negative Final   Parainfluenza 2 Negative Negative Final   Parainfluenza 3 Negative Negative Final   Metapneumovirus Negative Negative Final   Rhinovirus Negative Negative Final   Adenovirus Negative Negative Final    Comment: (NOTE) Performed At: [facility] [place], [place] [id number] Lindon Romp MD [id number]      Studies: No results found.  Scheduled Meds: .  stroke: mapping our early stages of recovery book   Does not apply Once  . aspirin  300 mg Rectal Daily   Or  . aspirin  325 mg Oral Daily  . atorvastatin  10 mg Oral q1800  . enoxaparin (LOVENOX) injection  30 mg Subcutaneous Q24H  . levofloxacin  250 mg Oral q1800  . memantine  28 mg Oral Daily  . sodium chloride  3 mL Intravenous Q12H   Continuous Infusions:  Antibiotics Given (last 72 hours)    Date/Time Action Medication Dose Rate   11/09/15 2103 Given   levofloxacin (LEVAQUIN) IVPB 500 mg 500 mg 100 mL/hr   11/10/15 1816 Given   levofloxacin (LEVAQUIN) tablet 250 mg 250 mg    11/11/15 1746 Given   levofloxacin (LEVAQUIN) tablet 250 mg 250 mg       Principal Problem:   Metabolic encephalopathy Active Problems:   ANEMIA   Unspecified protein-calorie malnutrition ([facility])   CKD (chronic kidney disease) stage 3,  GFR 30-59 ml/min   Dementia   Leukocytosis   GERD (gastroesophageal reflux disease)   Hyperlipidemia   Hypothyroidism   History of bacterial enteritis   Urinary retention   Dehydration, moderate   Altered mental status   Embolic stroke ([place])    Time spent: 25 min    [facility] Pager [phone]. If 7PM-7AM, please contact night-coverage at [email or web address], password [facility] 11/12/2015, 1:27 PM  LOS: 3 days

## 2015-11-13 MED ORDER — ASPIRIN 325 MG PO TABS: 325.0000 mg | ORAL_TABLET | Freq: Every day | ORAL | Status: AC

## 2015-11-13 MED ORDER — LEVOFLOXACIN 250 MG PO TABS: 250.0000 mg | ORAL_TABLET | Freq: Every day | ORAL | Status: DC

## 2015-11-13 MED ORDER — LORAZEPAM 0.5 MG PO TABS: 0.5000 mg | ORAL_TABLET | Freq: Two times a day (BID) | ORAL | Status: AC

## 2015-11-13 MED ORDER — TRAMADOL HCL 50 MG PO TABS: 50.0000 mg | ORAL_TABLET | Freq: Three times a day (TID) | ORAL | Status: AC | PRN

## 2015-11-13 NOTE — Progress Notes (Addendum)
Pt. D/c to [facility] via [facility]ALA Discharge information reviewed and given All personal belongings given to Pt.  Education discussed IV was d/c Tele d/c

## 2015-11-13 NOTE — Care Management Note (Signed)
Case Management Note  Patient Details  Name: Melanie Levy MRN: [id number] Date of Birth: [birth year]  Subjective/Objective:   Pt admitted with CVA                Action/Plan: PTA Pt was at ALF- [facility]-  Per PT/OT recommendations- pt to discharge to SNF- [facility] following for placement needs  Expected Discharge Date:     11/13/15             Expected Discharge Plan:  [place]  In-House Referral:  Clinical Social Work  Discharge planning Services  CM Consult  Post Acute Care Choice:    Choice offered to:     DME Arranged:    DME Agency:     HH Arranged:    [place] Agency:     Status of Service:  Completed, signed off  Medicare Important Message Given:  Yes Date Medicare IM Given:    Medicare IM give by:    Date Additional Medicare IM Given:    Additional Medicare Important Message give by:     If discussed at [place] of Stay Meetings, dates discussed:    Discharge Disposition: Skilled Facility   Additional Comments:  Dawayne Patricia, RN 11/13/2015, 2:41 PM

## 2015-11-13 NOTE — Care Management Important Message (Signed)
Important Message  Patient Details  Name: ADRIANNY SLISZ MRN: [id number] Date of Birth: 03-21-1928   Medicare Important Message Given:  Yes    Louanne Belton 11/13/2015, 11:24 AMImportant Message  Patient Details  Name: TAMIKE SEGGERMAN MRN: [id number] Date of Birth: 20-Jun-1928   Medicare Important Message Given:  Yes    Vietta Bonifield G 11/13/2015, 11:24 AM

## 2015-11-13 NOTE — Progress Notes (Addendum)
Physical Therapy Treatment Patient Details Name: Melanie Levy MRN: [id number] DOB: 12-20-1927 Today's Date: 11/13/2015    History of Present Illness Patient admitted with altered mental status.  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  PMH significant for dementia and multiple medical issues.    PT Comments    Patient still very limited in mobility and requires increased assistance for transfers.  Per ALF, patient was more able to assist with mobility than current status, therefore patient will benefit from continued PT to progress back toward baseline.  Recommend SNF due to decline in function.  Goals set.   Follow Up Recommendations  SNF     Equipment Recommendations  None recommended by PT    Recommendations for Other Services       Precautions / Restrictions Precautions Precautions: Fall    Mobility  Bed Mobility Overal bed mobility: Needs Assistance Bed Mobility: Supine to Sit;Rolling Rolling: Mod assist   Supine to sit: Max assist     General bed mobility comments: patient able to initiate bringing legs to edge of bed, required max to raise shoulders off bed and scoot to sitting EOB.  Transfers Overall transfer level: Needs assistance Equipment used: Rolling walker (2 wheeled) Transfers: Sit to/from [facility] Sit to Stand: Max assist;+2 physical assistance Stand pivot transfers: Max assist       General transfer comment: attempted sit to stand to RW with +2 assistance.  Patient unable to reach upright - leaning posteriorly and required max assist of 2 to maintain standing.  Performed bed to chair stand pivot as above.  Patient able to somewhat shuffle feet in transferring to chair.  Ambulation/Gait                 Stairs            Wheelchair Mobility    Modified Rankin (Stroke Patients Only)       Balance Overall balance assessment: Needs assistance Sitting-balance support: Bilateral  upper extremity supported Sitting balance-Leahy Scale: Poor Sitting balance - Comments: required assistance to sit edge of bed.   Postural control: Posterior lean Standing balance support: Bilateral upper extremity supported Standing balance-Leahy Scale: Zero                      Cognition Arousal/Alertness: Awake/alert Behavior During Therapy: Flat affect Overall Cognitive Status: No family/caregiver present to determine baseline cognitive functioning                      Exercises      General Comments        Pertinent Vitals/Pain Pain Assessment: No/denies pain    Home Living                      Prior Function            PT Goals (current goals can now be found in the care plan section) Progress towards PT goals:  (will set goals today.)    Frequency  Min 2X/week    PT Plan Frequency needs to be updated;Other (comment) (will set goals as per ALF patient not at baseline.)    Co-evaluation             End of Session   Activity Tolerance: No increased pain;Patient tolerated treatment well Patient left: in chair;with call bell/phone within reach;with chair alarm set     Time: [id number] PT Time Calculation (min) (ACUTE ONLY): 20  min  Charges:  $Therapeutic Activity: 8-22 mins                    G Codes:      Shanna Cisco 2015-11-24, 11:34 AM  11/24/2015 Melanie Levy, [place]

## 2015-11-13 NOTE — Progress Notes (Signed)
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  Domenica Reamer, [place] Social Worker [phone]

## 2015-11-13 NOTE — Discharge Summary (Signed)
Physician Discharge Summary  Melanie Levy [id number] DOB: January 16, 1928 DOA: 11/09/2015  PCP: Donnajean Lopes, MD  Admit date: 11/09/2015 Discharge date: 11/13/2015  Time spent: 35 minutes  Recommendations for Outpatient Follow-up:  1. Follow up with urology for possible voiding trial ([facility]) in 1 month-- change foley q 4 weeks (last done 12/29) 2. Outpatient 30 day event monitor  3. TO SNF   Discharge Diagnoses:  Principal Problem:   Metabolic encephalopathy Active Problems:   ANEMIA   Unspecified protein-calorie malnutrition (HCC)   CKD (chronic kidney disease) stage 3, GFR 30-59 ml/min   Dementia   Leukocytosis   GERD (gastroesophageal reflux disease)   Hyperlipidemia   Hypothyroidism   History of bacterial enteritis   Urinary retention   Dehydration, moderate   Altered mental status   Embolic stroke [facility])   Discharge Condition: improved  Diet recommendation: regular  Filed Weights   11/10/15 0441 11/11/15 0425 11/13/15 0300  Weight: 53.8 kg (118 lb 9.7 oz) 54.6 kg (120 lb 5.9 oz) 51.4 kg (113 lb 5.1 oz)    History of present illness:  This is an 82 year old female patient recently discharged from this facility on 12/9 after being admitted for bacterial enteritis. During that hospitalization patient had urinary retention and was evaluated by urology and discharged to the skilled nursing facility with a Foley catheter in place with plans to follow-up w/ urology after discharge. She has underlying history of iron deficiency anemia, gait instability, depression and anxiety, chronic kidney disease stage III, dementia, GERD, hypothyroidism. Since discharge patient has presented to the ER for a total of 3 evaluations (excluding today's visit). She presented on 12/12 after a fall, on 12/16 with reports of abdominal pain and again on 12/17 after a fall. All workup at each visit was unremarkable and patient was discharged back to the skilled nursing facility.  Today she was sent from the nursing facility because of altered mentation and extreme lethargy and question of right facial droop. Last seen normal unknown. Patient typically is able to feed herself but had to be fed today according to nursing staff. She was also running low-grade fevers. Patient unable to adequately answer history accurately and no family at bedside during EDP exam or during my evaluation. Medication reconciliation sheet from nursing facility shows patient is a full code.  Hospital Course:  Metabolic encephalopathy -UTI vs CVA - mentally to be back to baseline per daughter -not physically at baseline per PT so patient will be unable to go back to ALF and will need SNF  CVA: -12 acute micro embolic infarctions scattered throughout the brain -echo/carotid- ok -will need outpatient 30 day event monitor- called in to [place] on 12/30 - follow up with neuro 2 months   Leukocytosis -improved with abx -no growth on urine culture - influenza PCR panel negative   Urinary retention -Issue during previous hospitalization requiring Foley catheter which was continued upon discharge - spoke with urology who says to follow up in about 4 weeks for voiding trial D/c abx after 7 days total   Dehydration, moderate -s/p IVF   ANEMIA -Hemoglobin stable and at baseline   CKD stage 3, GFR 30-59 ml/min -Renal function stable and at baseline   Hypothyroidism -Continue preadmission Synthroid   History of bacterial enteritis -Completed full course of Cipro and Flagyl   Unspecified protein-calorie malnutrition /Dementia -Once diet resumed will need formal nutrition evaluation   GERD  -Apparent history of previous esophageal stricture -May require formal speech therapy evaluation  regarding swallowing   Hyperlipidemia -statin  Procedures:    Consultations:  neuro  Discharge Exam: Filed Vitals:   11/13/15 0300 11/13/15 1223  BP: 149/66 124/68  Pulse: 71 91   Temp: 97.8 F (36.6 C) 99.1 F (37.3 C)  Resp: 16 18    General: awake, NAD- "I'm fine"   Discharge Instructions   Discharge Instructions    Diet general    Complete by:  As directed      Discharge instructions    Complete by:  As directed   levaquin until 1/6     Increase activity slowly    Complete by:  As directed           Current Discharge Medication List    START taking these medications   Details  aspirin 325 MG tablet Take 1 tablet (325 mg total) by mouth daily.    levofloxacin (LEVAQUIN) 250 MG tablet Take 1 tablet (250 mg total) by mouth daily at 6 PM.      CONTINUE these medications which have CHANGED   Details  LORazepam (ATIVAN) 0.5 MG tablet Take 1 tablet (0.5 mg total) by mouth 2 (two) times daily. Qty: 14 tablet, Refills: 0    traMADol (ULTRAM) 50 MG tablet Take 1 tablet (50 mg total) by mouth every 8 (eight) hours as needed (for pain). Qty: 20 tablet, Refills: 0      CONTINUE these medications which have NOT CHANGED   Details  acetaminophen (TYLENOL) 325 MG tablet Take 650 mg by mouth every 6 (six) hours as needed for moderate pain or fever.    atorvastatin (LIPITOR) 10 MG tablet Take 10 mg by mouth daily at 6 PM.     clotrimazole (LOTRIMIN) 1 % cream Apply 1 application topically 2 (two) times daily as needed (rash).     famotidine (PEPCID) 20 MG tablet Take 1 tablet (20 mg total) by mouth daily. Qty: 30 tablet, Refills: 0    FLUoxetine (PROZAC) 20 MG capsule Take 20 mg by mouth daily.    Fluticasone Furoate-Vilanterol (BREO ELLIPTA) 100-25 MCG/INH AEPB Inhale 1 puff into the lungs daily.    guaifenesin (ROBITUSSIN) 100 MG/5ML syrup Take 100 mg by mouth 4 (four) times daily as needed for cough.    iron polysaccharides (NIFEREX) 150 MG capsule Take 150 mg by mouth 2 (two) times daily.      levothyroxine (SYNTHROID, LEVOTHROID) 75 MCG tablet Take 75 mcg by mouth daily before breakfast.    loratadine (CLARITIN) 10 MG tablet Take 10 mg by  mouth daily.    megestrol (MEGACE) 40 MG/ML suspension Take 10 mLs (400 mg total) by mouth daily. Qty: 240 mL, Refills: 0    Memantine HCl ER (NAMENDA XR) 28 MG CP24 Take 28 mg by mouth daily.    mirtazapine (REMERON) 15 MG tablet Take 15 mg by mouth at bedtime.      Multiple Vitamins-Minerals (PRESERVISION AREDS 2 PO) Take 1 capsule by mouth 2 (two) times daily.     PRESCRIPTION MEDICATION Apply 1 application topically as needed (for itching to skin). TRIAMCINOLONE 0.1% AND HYDROCERIN 2:1 CREAM    tamsulosin (FLOMAX) 0.4 MG CAPS capsule Take 1 capsule (0.4 mg total) by mouth daily after supper. Qty: 30 capsule, Refills: 0    triamcinolone cream (KENALOG) 0.5 % Apply 1 application topically 3 (three) times daily as needed (rash). Apply to nose and neck rash as needed    Vitamin D, Ergocalciferol, (DRISDOL) 50000 UNITS CAPS capsule Take 50,000 Units by  mouth every 7 (seven) days.      STOP taking these medications     nitrofurantoin, macrocrystal-monohydrate, (MACROBID) 100 MG capsule        Allergies  Allergen Reactions  . Iohexol      Desc: Pt states that years ago she had a procedure that involved IV contrast and had throat swelling and sob. She was told never to have IV contrast again.  Pt is a very poor historian and cannot remember the date or the procedure.   . Milk-Related Compounds Nausea And Vomiting  . Nexium [Esomeprazole Magnesium] Other (See Comments)    Per MAR  . Penicillins Swelling    Tongue swelling Has patient had a PCN reaction causing immediate rash, facial/tongue/throat swelling, SOB or lightheadedness with hypotension: No Has patient had a PCN reaction causing severe rash involving mucus membranes or skin necrosis: No Has patient had a PCN reaction that required hospitalization No Has patient had a PCN reaction occurring within the last 10 years: No If all of the above answers are "NO", then may proceed with Cepha   Follow-up Information    Follow up  with Xu,Jindong, MD. Schedule an appointment as soon as possible for a visit in 2 months.   Specialty:  Neurology   Why:  stroke clinic   Contact information:   [address] [place] [address] [phone]        The results of significant diagnostics from this hospitalization (including imaging, microbiology, ancillary and laboratory) are listed below for reference.    Significant Diagnostic Studies: Ct Abdomen Pelvis Wo Contrast  10/28/2015  CLINICAL DATA:  Lower quadrant tenderness. EXAM: CT ABDOMEN AND PELVIS WITHOUT CONTRAST TECHNIQUE: Multidetector CT imaging of the abdomen and pelvis was performed following the standard protocol without IV contrast. COMPARISON:  10/14/2015 FINDINGS: Atelectasis in the lung bases. Large esophageal hiatal hernia containing much of the stomach. Low-attenuation lesion along the diaphragm on the right, measuring about 2.4 cm diameter. This is not change since previous study and probably represents a developmental cystic structure such is a duplication cyst. Cyst centrally in the liver measuring 2.8 cm without change. Surgical absence of the gallbladder. No bile duct dilatation. The unenhanced appearance of the spleen, pancreas, adrenal glands, kidneys, inferior vena cava, and retroperitoneal lymph nodes is unremarkable. Scattered calcification in the aorta without aneurysm. Stomach, small bowel, and colon are not abnormally distended. No bowel wall thickening is appreciated. There is a midline subxiphoid anterior abdominal wall hernia containing fat and now containing a small amount of small bowel. There is no proximal bowel obstruction. There is infiltration within the herniated fat which may indicate fat necrosis. No free air or free fluid in the abdomen. Pelvis: Uterus and ovaries are not enlarged. Bladder wall is not thickened. No free or loculated pelvic fluid collections. No pelvic mass or lymphadenopathy. The appendix is not identified.  Prominent stool-filled rectum. Diverticulosis of the sigmoid colon without evidence of diverticulitis. Degenerative changes in the spine and hips. Nonspecific calcifications demonstrated in the sacrum. Old fracture deformities of the right superior and inferior pubic rami. Slight anterior subluxation of L3 on L4. Superior endplate compression of T10. Bone changes are similar to previous study. IMPRESSION: Large esophageal hiatal hernia containing most of the stomach. Midline anterior abdominal wall hernia containing fat with possible fat necrosis and now containing a small amount of small bowel. No evidence of small bowel obstruction. Cystic structure in the right costophrenic angle probably representing a benign developmental cyst.  Electronically Signed   By: Lucienne Capers M.D.   On: 10/28/2015 02:16   Ct Abdomen Pelvis Wo Contrast  10/14/2015  CLINICAL DATA:  Abdominal pain with soft tissue abdominal mass. History of dementia and anemia. EXAM: CT ABDOMEN AND PELVIS WITHOUT CONTRAST TECHNIQUE: Multidetector CT imaging of the abdomen and pelvis was performed following the standard protocol without IV contrast. COMPARISON:  Abdominal CT 07/29/2003.  Pelvic CT 10/13/2013. FINDINGS: Lower chest: The lung bases are clear. There is no significant pleural or pericardial effusion. There is a large hiatal hernia which has enlarged compared with the prior study. 3 cm low-density lesion along the right hemidiaphragm is unchanged, likely an incidental diaphragmatic or bronchogenic cyst. Hepatobiliary: There is a 2.8 cm cyst in the left hepatic lobe on image number 18 which has enlarged. No suspicious hepatic findings. Mild extrahepatic biliary prominence status post cholecystectomy, within physiologic limits. Pancreas: Atrophied without focal abnormality or surrounding inflammation. Spleen: Normal in size without focal abnormality. Adrenals/Urinary Tract: Both adrenal glands appear normal. Both kidneys appear normal  without evidence of hydronephrosis, focal mass lesion or perinephric soft tissue stranding. No evidence of urinary tract calculus. The bladder is mildly distended without apparent focal abnormality. Stomach/Bowel: As above, large hiatal hernia. There is a long segment of abnormal circumferential small bowel wall thickening in the mid and right abdomen. The distal small bowel appears normal. There is no high-grade bowel obstruction. There is edema throughout the mesenteric fat. Mild diverticular changes are present within the distal colon. No definite extraluminal air or fluid collections are identified, although there is an atypical collection of gas in the mid abdomen (image 42) which appears to be within the small bowel lumen based on the reformatted images. Vascular/Lymphatic: There are no enlarged abdominal or pelvic lymph nodes. Mild atherosclerosis of the aorta, its branches and the iliac arteries. Vascular assessment limited without contrast. Reproductive: Unremarkable. Other: There is a midline supraumbilical hernia containing only fat, measuring up to 6.1 cm in diameter. No herniated bowel or soft tissue mass identified. Musculoskeletal: No acute or significant osseous findings. There are posttraumatic deformities of the right pubic rami. There are degenerative changes throughout the lumbar spine associate with a convex right scoliosis and grade 1 anterolisthesis at L3-4. Superior endplate compression deformity at T10 does not appear acute. IMPRESSION: 1. Abnormal long segment mid small bowel wall thickening with surrounding mesenteric edema suspicious for ischemia or bowel hemorrhage. No high-grade obstruction or signs of perforation. 2. Patient's palpable concern may correspond with a supraumbilical hernia containing only fat. There is no herniated bowel. 3. Large hiatal hernia. 4. These results were called by telephone at the time of interpretation on 10/14/2015 at 2:03 pm to Dr. Shirlyn Goltz , who verbally  acknowledged these results. Electronically Signed   By: Richardean Sale M.D.   On: 10/14/2015 14:05   Dg Chest 2 View  11/09/2015  CLINICAL DATA:  Altered mental status. Right facial droop. Weakness. EXAM: CHEST  2 VIEW COMPARISON:  Chest x-rays dated 10/27/2015 and 08/24/2013 and CT scan of the chest dated 03/19/2006 FINDINGS: Heart size and pulmonary vascularity are normal and the lungs are clear. Large hiatal hernia. Stable cyst on the lateral aspect of the right hemidiaphragm, unchanged since 2007. There is a compression fracture of the superior aspect of T10, new since 08/24/2013. IMPRESSION: Compression fracture of T10, new since 2014. It is unchanged since 10/27/2015 no better seen on today's study because of the lateral view. Chronic large hiatal hernia. Stable cyst on  the right hemidiaphragm. Electronically Signed   By: Lorriane Shire M.D.   On: 11/09/2015 15:09   Ct Head Wo Contrast  11/09/2015  CLINICAL DATA:  Altered mental status, lethargy today, RIGHT-side facial droop, leaning to RIGHT side, weakness, unknown when last seen normal EXAM: CT HEAD WITHOUT CONTRAST TECHNIQUE: Contiguous axial images were obtained from the base of the skull through the vertex without intravenous contrast. COMPARISON:  10/28/2015 FINDINGS: Asymmetric positioning in gantry. Generalized atrophy with ex vacuo dilatation of the ventricular system. No midline shift or mass effect. Minimal small vessel chronic ischemic changes of deep cerebral white matter. No intracranial hemorrhage, mass lesion or evidence acute infarction. Benign-appearing basal ganglia calcifications again identified. No extra-axial fluid collections. Question small mucosal retention cyst within the RIGHT maxillary sinus. Remaining bones and sinuses unremarkable. IMPRESSION: Atrophy with minimal small vessel chronic ischemic changes of deep cerebral white matter. No acute intracranial abnormalities. Electronically Signed   By: Lavonia Dana M.D.   On:  11/09/2015 15:17   Ct Head Wo Contrast  10/28/2015  CLINICAL DATA:  Fall. EXAM: CT HEAD WITHOUT CONTRAST CT CERVICAL SPINE WITHOUT CONTRAST TECHNIQUE: Multidetector CT imaging of the head and cervical spine was performed following the standard protocol without intravenous contrast. Multiplanar CT image reconstructions of the cervical spine were also generated. COMPARISON:  04/12/2015 FINDINGS: CT HEAD FINDINGS Prominence of the sulci and ventricles compatible with brain atrophy. There is low attenuation within the subcortical and periventricular white matter compatible with chronic microvascular disease. No evidence for acute intracranial hemorrhage, acute cortical infarct or mass. No abnormal extra-axial fluid collections identified. The paranasal sinuses and mastoid air cells are clear. The calvarium appears intact. CT CERVICAL SPINE FINDINGS There is reversal of normal cervical lordosis. The vertebral body heights are well preserved. The facet joints are aligned. The prevertebral soft tissue space appears normal. There is multi level disc space narrowing and ventral endplate spurring. This is most advanced at C5-6, C6-7 and C7-T1. IMPRESSION: 1. No acute intracranial abnormalities. 2. Small vessel ischemic disease and chronic brain atrophy. 3. Cervical spondylosis. 4. No evidence for cervical spine fracture or subluxation. Electronically Signed   By: Kerby Moors M.D.   On: 10/28/2015 20:49   Ct Cervical Spine Wo Contrast  10/28/2015  CLINICAL DATA:  Fall. EXAM: CT HEAD WITHOUT CONTRAST CT CERVICAL SPINE WITHOUT CONTRAST TECHNIQUE: Multidetector CT imaging of the head and cervical spine was performed following the standard protocol without intravenous contrast. Multiplanar CT image reconstructions of the cervical spine were also generated. COMPARISON:  04/12/2015 FINDINGS: CT HEAD FINDINGS Prominence of the sulci and ventricles compatible with brain atrophy. There is low attenuation within the  subcortical and periventricular white matter compatible with chronic microvascular disease. No evidence for acute intracranial hemorrhage, acute cortical infarct or mass. No abnormal extra-axial fluid collections identified. The paranasal sinuses and mastoid air cells are clear. The calvarium appears intact. CT CERVICAL SPINE FINDINGS There is reversal of normal cervical lordosis. The vertebral body heights are well preserved. The facet joints are aligned. The prevertebral soft tissue space appears normal. There is multi level disc space narrowing and ventral endplate spurring. This is most advanced at C5-6, C6-7 and C7-T1. IMPRESSION: 1. No acute intracranial abnormalities. 2. Small vessel ischemic disease and chronic brain atrophy. 3. Cervical spondylosis. 4. No evidence for cervical spine fracture or subluxation. Electronically Signed   By: Kerby Moors M.D.   On: 10/28/2015 20:49   Mr Brain Wo Contrast (neuro Protocol)  11/09/2015  CLINICAL  DATA:  Acute presentation with altered mental status. Lethargy, right facial drooping confusion. EXAM: MRI HEAD WITHOUT CONTRAST TECHNIQUE: Multiplanar, multiecho pulse sequences of the brain and surrounding structures were obtained without intravenous contrast. COMPARISON:  Head CT same day, MRI 08/26/2013 FINDINGS: There are approximately 12 micro embolic infarctions scattered throughout the brain consistent with embolic disease from the heart or ascending aorta. Several punctate foci are scattered within the cerebellum. Other punctate foci are present within the frontoparietal cortex and deep white matter. No large vessel territory infarction. No mass lesion, hemorrhage, hydrocephalus or extra-axial collection. Chronic small-vessel ischemic changes are present throughout the brainstem and cerebral hemispheric white matter. There is generalized brain atrophy. No pituitary mass. No inflammatory sinus disease. Major vessels at the base of the brain show flow.  IMPRESSION: Approximately 12 acute micro embolic infarctions scattered throughout the brain, consistent with embolic disease from the heart or ascending aorta. No large confluent infarction. No mass effect or hemorrhage. Electronically Signed   By: Nelson Chimes M.D.   On: 11/09/2015 17:30   Dg Chest Port 1 View  11/10/2015  CLINICAL DATA:  Leukocytosis EXAM: PORTABLE CHEST 1 VIEW COMPARISON:  November 09, 2015 FINDINGS: There is no edema or consolidation. Heart is normal in size and contour with pulmonary vascular within normal limits. There is a sizable hiatal type hernia. No adenopathy. There is atherosclerotic calcification in the aorta. IMPRESSION: No edema or consolidation. No change in cardiac silhouette. Sizable hiatal hernia present. Electronically Signed   By: Lowella Grip III M.D.   On: 11/10/2015 08:04   Dg Abd Acute W/chest  10/27/2015  CLINICAL DATA:  Mid and lower abdominal pain for 1 week, evaluate for obstruction. EXAM: DG ABDOMEN ACUTE W/ 1V CHEST COMPARISON:  Chest and abdominal radiographs 10/16/2015. CT 10/14/2015 FINDINGS: Large hiatal hernia. Cardiomediastinal contours are unchanged. No consolidation. No free intra-abdominal air. No dilated bowel loops to suggest obstruction. Moderate stool throughout the colon with enteric contrast within multiple distal colonic diverticula from prior CT. A stool ball distends the rectum. Surgical clips in the right upper quadrant the abdomen from cholecystectomy. Remote right pelvis fracture again seen. Bones under mineralized. IMPRESSION: 1. No evidence of bowel obstruction. Moderate stool burden with stool ball in the rectum. Diverticulosis with retained enteric contrast within colonic diverticula. 2. Hiatal hernia. Electronically Signed   By: Jeb Levering M.D.   On: 10/27/2015 23:48   Dg Abd Acute W/chest  10/16/2015  CLINICAL DATA:  Right lower quadrant pain.  Nausea EXAM: DG ABDOMEN ACUTE W/ 1V CHEST COMPARISON:  08/24/2013 chest  x-ray FINDINGS: Previously administered oral contrast has reached the nondilated colon, showing multiple colonic diverticula. There is no indication of small bowel obstruction. No evidence of pneumatosis or perforation. No cardiomegaly. Lower mediastinal widening from large hiatal hernia. There is no edema, consolidation, effusion, or pneumothorax. Remote right obturator ring fracture with pelvic asymmetry from displacement. IMPRESSION: 1. Nonobstructive bowel gas pattern. 2. No evidence of acute cardiopulmonary disease. Electronically Signed   By: Monte Fantasia M.D.   On: 10/16/2015 11:54   Mr Jodene Nam Head/brain Wo Cm  11/09/2015  CLINICAL DATA:  Recent diagnosis of bacterial enteritis, follow-up stroke. History of stroke, dementia, hyperlipidemia, chronic kidney disease. EXAM: MRA HEAD WITHOUT CONTRAST TECHNIQUE: Angiographic images of the Circle of Willis were obtained using MRA technique without intravenous contrast. COMPARISON:  MRI of the brain November 09, 2015 at 1645 hours FINDINGS: Anterior circulation: Normal flow related enhancement of the included cervical, petrous, cavernous and supraclinoid  internal carotid arteries. Patent anterior communicating artery. Flow related enhancement of the anterior and middle cerebral arteries, including distal segments. Mild stenosis RIGHT M1 and M2 segments. No large vessel occlusion, high-grade stenosis, abnormal luminal irregularity, aneurysm. Posterior circulation: RIGHT vertebral artery is dominant. Basilar artery is patent, with normal flow related enhancement of the main branch vessels. Normal flow related enhancement of the posterior cerebral arteries. No large vessel occlusion, high-grade stenosis, abnormal luminal irregularity, aneurysm. IMPRESSION: No acute large vessel or high-grade stenosis. Mild stenosis RIGHT M1 and M2 segments most compatible with atherosclerosis. Electronically Signed   By: Elon Alas M.D.   On: 11/09/2015 22:46   Dg Hip  Unilat  With Pelvis 2-3 Views Right  10/28/2015  CLINICAL DATA:  Fall.  Tail bone and groin pain. EXAM: DG HIP (WITH OR WITHOUT PELVIS) 2-3V RIGHT COMPARISON:  07/12/2015 FINDINGS: Chronic deformity involving the right superior and inferior pubic rami identified. Gas and stool overlie the pubic symphysis. There is no evidence of acute hip fracture or dislocation. There is no evidence of arthropathy or other focal bone abnormality. IMPRESSION: 1. No evidence for acute fracture or dislocation. If there is high clinical suspicion for occult fracture or the patient refuses to weightbear, consider further evaluation with MRI. Although CT is expeditious, evidence is lacking regarding accuracy of CT over plain film radiography. 2. Chronic deformity involves the right superior and inferior pubic rami. Likely posttraumatic. Electronically Signed   By: Kerby Moors M.D.   On: 10/28/2015 18:59    Microbiology: Recent Results (from the past 240 hour(s))  Urine culture     Status: None   Collection Time: 11/09/15  3:57 PM  Result Value Ref Range Status   Specimen Description URINE, RANDOM  Final   Special Requests NONE  Final   Culture MULTIPLE SPECIES PRESENT, SUGGEST RECOLLECTION  Final   Report Status 11/10/2015 FINAL  Final  Culture, blood (Routine X 2) w Reflex to ID Panel     Status: None (Preliminary result)   Collection Time: 11/09/15  7:25 PM  Result Value Ref Range Status   Specimen Description BLOOD LEFT ANTECUBITAL  Final   Special Requests BOTTLES DRAWN AEROBIC AND ANAEROBIC 5CC   Final   Culture NO GROWTH 4 DAYS  Final   Report Status PENDING  Incomplete  Culture, blood (Routine X 2) w Reflex to ID Panel     Status: None (Preliminary result)   Collection Time: 11/09/15  7:34 PM  Result Value Ref Range Status   Specimen Description BLOOD RIGHT HAND  Final   Special Requests BOTTLES DRAWN AEROBIC AND ANAEROBIC 5CC EA  Final   Culture NO GROWTH 4 DAYS  Final   Report Status PENDING   Incomplete  Respiratory virus panel     Status: None   Collection Time: 11/09/15 10:00 PM  Result Value Ref Range Status   Respiratory Syncytial Virus A Negative Negative Final   Respiratory Syncytial Virus B Negative Negative Final   Influenza A Negative Negative Final   Influenza B Negative Negative Final   Parainfluenza 1 Negative Negative Final   Parainfluenza 2 Negative Negative Final   Parainfluenza 3 Negative Negative Final   Metapneumovirus Negative Negative Final   Rhinovirus Negative Negative Final   Adenovirus Negative Negative Final    Comment: (NOTE) Performed At: [facility] [address] [place], [place] [id number] Lindon Romp MD [id number]      Labs: Basic Metabolic Panel:  Recent Labs Lab 11/09/15 1446 11/10/15 0247  11/12/15 0447  NA 143 141 141  K 3.9 3.8 4.2  CL 108 114* 113*  CO2 22 18* 20*  GLUCOSE 122* 81 87  BUN 23* 18 20  CREATININE 1.38* 1.16* 1.21*  CALCIUM 8.9 8.0* 8.6*   Liver Function Tests:  Recent Labs Lab 11/09/15 1446  AST 24  ALT 20  ALKPHOS 92  BILITOT 0.3  PROT 5.9*  ALBUMIN 3.3*   No results for input(s): LIPASE, AMYLASE in the last 168 hours. No results for input(s): AMMONIA in the last 168 hours. CBC:  Recent Labs Lab 11/09/15 1446 11/10/15 0247 11/12/15 0447  WBC 15.0* 13.2* 6.2  NEUTROABS 12.9*  --   --   HGB 11.4* 9.8* 10.7*  HCT 35.3* 29.6* 32.4*  MCV 97.8 97.7 97.0  PLT 171 142* 188   Cardiac Enzymes: No results for input(s): CKTOTAL, CKMB, CKMBINDEX, TROPONINI in the last 168 hours. BNP: BNP (last 3 results) No results for input(s): BNP in the last 8760 hours.  ProBNP (last 3 results) No results for input(s): PROBNP in the last 8760 hours.  CBG: No results for input(s): GLUCAP in the last 168 hours.     Signed:  Geradine Girt DO  [facility] 11/13/2015, 12:40 PM

## 2015-11-14 ENCOUNTER — Non-Acute Institutional Stay (SKILLED_NURSING_FACILITY): Payer: Medicare Other | Admitting: Internal Medicine

## 2015-11-14 DIAGNOSIS — R339 Retention of urine, unspecified: Secondary | ICD-10-CM | POA: Diagnosis not present

## 2015-11-14 DIAGNOSIS — D509 Iron deficiency anemia, unspecified: Secondary | ICD-10-CM

## 2015-11-14 DIAGNOSIS — F341 Dysthymic disorder: Secondary | ICD-10-CM

## 2015-11-14 DIAGNOSIS — R531 Weakness: Secondary | ICD-10-CM | POA: Diagnosis not present

## 2015-11-14 DIAGNOSIS — N390 Urinary tract infection, site not specified: Secondary | ICD-10-CM

## 2015-11-14 DIAGNOSIS — I6340 Cerebral infarction due to embolism of unspecified cerebral artery: Secondary | ICD-10-CM | POA: Diagnosis not present

## 2015-11-14 DIAGNOSIS — N183 Chronic kidney disease, stage 3 unspecified: Secondary | ICD-10-CM

## 2015-11-14 DIAGNOSIS — F0390 Unspecified dementia without behavioral disturbance: Secondary | ICD-10-CM | POA: Diagnosis not present

## 2015-11-14 DIAGNOSIS — E46 Unspecified protein-calorie malnutrition: Secondary | ICD-10-CM

## 2015-11-14 DIAGNOSIS — K222 Esophageal obstruction: Secondary | ICD-10-CM

## 2015-11-14 DIAGNOSIS — R1310 Dysphagia, unspecified: Secondary | ICD-10-CM | POA: Diagnosis not present

## 2015-11-14 LAB — CULTURE, BLOOD (ROUTINE X 2)
CULTURE: NO GROWTH
CULTURE: NO GROWTH

## 2015-11-14 NOTE — Progress Notes (Signed)
Patient ID: Melanie Levy, female   DOB: 01-17-1928, 82 y.o.   MRN: [id number]     Facility: [facility]    PCP: Donnajean Lopes, MD  Code Status: DNR  Allergies reviewed  Chief Complaint  Patient presents with  . New Admit To SNF     HPI:  82 y.o. patient is here for short term rehabilitation post hospital admission from [id number] with metabolic encephalopathy in setting of UTI and CVA. Brain imaging showed 12 acute micro embolic infarctions throughout her brain. Echocardiogram and carotid doppler were unrevealing. She was treated with antibiotics. She continued to have foley catheter for her urinary retention. She received iv fluids for hypovolemia. She has past medical history of iron deficiency anemia, gait instability, depression and anxiety, chronic kidney disease stage III, dementia, GERD, hypothyroidism. She is seen in her room today. She denies any complaints this visit. She appears to be somewhat confused to me.    Review of Systems:  Constitutional: Negative for fever, diaphoresis.  HENT: Negative for headache, congestion, nasal discharge. Has some difficulty swallowing.   Eyes: Negative for blurred vision, double vision and discharge.  Respiratory: Negative for cough, shortness of breath and wheezing.   Cardiovascular: Negative for chest pain, palpitations, leg swelling.  Gastrointestinal: Negative for heartburn, nausea, vomiting, abdominal pain. Had bowel movement yesterday Genitourinary: Negative for flank pain.  Musculoskeletal: Negative for back pain, falls Skin: Negative for itching, rash.  Neurological: Negative for dizziness, tingling, focal weakness Psychiatric/Behavioral: Negative for depression. Has memory loss.     Past Medical History  Diagnosis Date  . Diverticulosis   . Esophageal stricture   . Hiatal hernia   . Iron deficiency anemia   . Depression   . Anxiety   . Arthritis   . Asthma   . Gastric ulcer 1994   . Partial small bowel obstruction ([place]) 1995  . IBS (irritable bowel syndrome)   . GERD (gastroesophageal reflux disease)   . Osteoporosis   . Insomnia   . Macular degeneration   . Seborrheic keratosis   . Stroke ([place])   . Dementia   . CKD (chronic kidney disease) stage 3, GFR 30-59 ml/min 10/14/2015  . Hypothyroidism   . Hyperlipidemia    Past Surgical History  Procedure Laterality Date  . Cholecystectomy    . Appendectomy    . Tonsillectomy    . Spine surgery    . Bunionectomy    . Sinus surgery with instatrak    . Abdominal hysterectomy     Social History:   reports that she has never smoked. She has never used smokeless tobacco. She reports that she drinks alcohol. She reports that she does not use illicit drugs.  Family History  Problem Relation Age of Onset  . Ovarian cancer Mother   . Heart disease    . Macular degeneration    . Colon cancer Neg Hx     Medications:   Medication List       This list is accurate as of: 11/14/15 10:36 PM.  Always use your most recent med list.               acetaminophen 325 MG tablet  Commonly known as:  TYLENOL  Take 650 mg by mouth every 6 (six) hours as needed for moderate pain or fever.     aspirin 325 MG tablet  Take 1 tablet (325 mg total) by mouth daily.     atorvastatin 10 MG tablet  Commonly  known as:  LIPITOR  Take 10 mg by mouth daily at 6 PM.     BREO ELLIPTA 100-25 MCG/INH Aepb  Generic drug:  Fluticasone Furoate-Vilanterol  Inhale 1 puff into the lungs daily.     clotrimazole 1 % cream  Commonly known as:  LOTRIMIN  Apply 1 application topically 2 (two) times daily as needed (rash).     famotidine 20 MG tablet  Commonly known as:  PEPCID  Take 1 tablet (20 mg total) by mouth daily.     FLUoxetine 20 MG capsule  Commonly known as:  PROZAC  Take 20 mg by mouth daily.     guaifenesin 100 MG/5ML syrup  Commonly known as:  ROBITUSSIN  Take 100 mg by mouth 4 (four) times daily as needed for cough.       iron polysaccharides 150 MG capsule  Commonly known as:  NIFEREX  Take 150 mg by mouth 2 (two) times daily.     levofloxacin 250 MG tablet  Commonly known as:  LEVAQUIN  Take 1 tablet (250 mg total) by mouth daily at 6 PM.     levothyroxine 75 MCG tablet  Commonly known as:  SYNTHROID, LEVOTHROID  Take 75 mcg by mouth daily before breakfast.     loratadine 10 MG tablet  Commonly known as:  CLARITIN  Take 10 mg by mouth daily.     LORazepam 0.5 MG tablet  Commonly known as:  ATIVAN  Take 1 tablet (0.5 mg total) by mouth 2 (two) times daily.     megestrol 40 MG/ML suspension  Commonly known as:  MEGACE  Take 10 mLs (400 mg total) by mouth daily.     mirtazapine 15 MG tablet  Commonly known as:  REMERON  Take 15 mg by mouth at bedtime.     NAMENDA XR 28 MG Cp24 24 hr capsule  Generic drug:  memantine  Take 28 mg by mouth daily.     PRESCRIPTION MEDICATION  Apply 1 application topically as needed (for itching to skin). TRIAMCINOLONE 0.1% AND HYDROCERIN 2:1 CREAM     PRESERVISION AREDS 2 PO  Take 1 capsule by mouth 2 (two) times daily.     tamsulosin 0.4 MG Caps capsule  Commonly known as:  FLOMAX  Take 1 capsule (0.4 mg total) by mouth daily after supper.     traMADol 50 MG tablet  Commonly known as:  ULTRAM  Take 1 tablet (50 mg total) by mouth every 8 (eight) hours as needed (for pain).     triamcinolone cream 0.5 %  Commonly known as:  KENALOG  Apply 1 application topically 3 (three) times daily as needed (rash). Apply to nose and neck rash as needed     Vitamin D (Ergocalciferol) 50000 units Caps capsule  Commonly known as:  DRISDOL  Take 50,000 Units by mouth every 7 (seven) days.         Physical Exam: Filed Vitals:   11/14/15 2235  BP: 142/80  Pulse: 78  Temp: 98.7 F (37.1 C)  Resp: 18  SpO2: 97%    General- elderly female, frail, in no acute distress Head- normocephalic, atraumatic Nose- no maxillary or frontal sinus tenderness, no  nasal discharge Throat- moist mucus membrane  Eyes- no pallor, no icterus, no discharge, normal conjunctiva, normal sclera Neck- no cervical lymphadenopathy Cardiovascular- normal s1,s2, + murmur, no leg edema Respiratory- bilateral clear to auscultation, no wheeze, no rhonchi, no crackles, no use of accessory muscles Abdomen- bowel sounds present, soft,  non tender, has foley in place Musculoskeletal- able to move all 4 extremities, generalized weakness Neurological- alert and oriented to person Skin- warm and dry   Labs reviewed: Basic Metabolic Panel:  Recent Labs  10/14/15 1222  10/16/15 0510  11/09/15 1446 11/10/15 0247 11/12/15 0447  NA 142  < > 142  < > 143 141 141  K 4.6  < > 3.9  < > 3.9 3.8 4.2  CL 106  < > 111  < > 108 114* 113*  CO2 27  < > 25  < > 22 18* 20*  GLUCOSE 110*  < > 90  < > 122* 81 87  BUN 26*  < > 14  < > 23* 18 20  CREATININE 1.16*  < > 1.08*  < > 1.38* 1.16* 1.21*  CALCIUM 9.0  < > 7.8*  < > 8.9 8.0* 8.6*  MG 2.2  --  1.7  --   --   --   --   < > = values in this interval not displayed. Liver Function Tests:  Recent Labs  10/14/15 1222 10/15/15 0810 11/09/15 1446  AST 24 17 24   ALT 16 14 20   ALKPHOS 84 69 92  BILITOT 0.8 0.7 0.3  PROT 6.8 5.6* 5.9*  ALBUMIN 4.0 3.2* 3.3*    Recent Labs  10/14/15 1222  LIPASE 54*   No results for input(s): AMMONIA in the last 8760 hours. CBC:  Recent Labs  10/23/15 0946 10/27/15 2318 11/09/15 1446 11/10/15 0247 11/12/15 0447  WBC 9.0 9.0 15.0* 13.2* 6.2  NEUTROABS 6.2 6.4 12.9*  --   --   HGB 12.1 11.4* 11.4* 9.8* 10.7*  HCT 37.5 35.7* 35.3* 29.6* 32.4*  MCV 97.9 100.0 97.8 97.7 97.0  PLT 205 248 171 142* 188    Radiological Exams: Dg Chest 2 View  11/09/2015  CLINICAL DATA:  Altered mental status. Right facial droop. Weakness. EXAM: CHEST  2 VIEW COMPARISON:  Chest x-rays dated 10/27/2015 and 08/24/2013 and CT scan of the chest dated 03/19/2006 FINDINGS: Heart size and pulmonary  vascularity are normal and the lungs are clear. Large hiatal hernia. Stable cyst on the lateral aspect of the right hemidiaphragm, unchanged since 2007. There is a compression fracture of the superior aspect of T10, new since 08/24/2013. IMPRESSION: Compression fracture of T10, new since 2014. It is unchanged since 10/27/2015 no better seen on today's study because of the lateral view. Chronic large hiatal hernia. Stable cyst on the right hemidiaphragm. Electronically Signed   By: Lorriane Shire M.D.   On: 11/09/2015 15:09   Ct Head Wo Contrast  11/09/2015  CLINICAL DATA:  Altered mental status, lethargy today, RIGHT-side facial droop, leaning to RIGHT side, weakness, unknown when last seen normal EXAM: CT HEAD WITHOUT CONTRAST TECHNIQUE: Contiguous axial images were obtained from the base of the skull through the vertex without intravenous contrast. COMPARISON:  10/28/2015 FINDINGS: Asymmetric positioning in gantry. Generalized atrophy with ex vacuo dilatation of the ventricular system. No midline shift or mass effect. Minimal small vessel chronic ischemic changes of deep cerebral white matter. No intracranial hemorrhage, mass lesion or evidence acute infarction. Benign-appearing basal ganglia calcifications again identified. No extra-axial fluid collections. Question small mucosal retention cyst within the RIGHT maxillary sinus. Remaining bones and sinuses unremarkable. IMPRESSION: Atrophy with minimal small vessel chronic ischemic changes of deep cerebral white matter. No acute intracranial abnormalities. Electronically Signed   By: Lavonia Dana M.D.   On: 11/09/2015 15:17   Mr  Brain Wo Contrast (neuro Protocol)  11/09/2015  CLINICAL DATA:  Acute presentation with altered mental status. Lethargy, right facial drooping confusion. EXAM: MRI HEAD WITHOUT CONTRAST TECHNIQUE: Multiplanar, multiecho pulse sequences of the brain and surrounding structures were obtained without intravenous contrast. COMPARISON:   Head CT same day, MRI 08/26/2013 FINDINGS: There are approximately 12 micro embolic infarctions scattered throughout the brain consistent with embolic disease from the heart or ascending aorta. Several punctate foci are scattered within the cerebellum. Other punctate foci are present within the frontoparietal cortex and deep white matter. No large vessel territory infarction. No mass lesion, hemorrhage, hydrocephalus or extra-axial collection. Chronic small-vessel ischemic changes are present throughout the brainstem and cerebral hemispheric white matter. There is generalized brain atrophy. No pituitary mass. No inflammatory sinus disease. Major vessels at the base of the brain show flow. IMPRESSION: Approximately 12 acute micro embolic infarctions scattered throughout the brain, consistent with embolic disease from the heart or ascending aorta. No large confluent infarction. No mass effect or hemorrhage. Electronically Signed   By: Nelson Chimes M.D.   On: 11/09/2015 17:30   Dg Chest Port 1 View  11/10/2015  CLINICAL DATA:  Leukocytosis EXAM: PORTABLE CHEST 1 VIEW COMPARISON:  November 09, 2015 FINDINGS: There is no edema or consolidation. Heart is normal in size and contour with pulmonary vascular within normal limits. There is a sizable hiatal type hernia. No adenopathy. There is atherosclerotic calcification in the aorta. IMPRESSION: No edema or consolidation. No change in cardiac silhouette. Sizable hiatal hernia present. Electronically Signed   By: Lowella Grip III M.D.   On: 11/10/2015 08:04   Mr Jodene Nam Head/brain Wo Cm  11/09/2015  CLINICAL DATA:  Recent diagnosis of bacterial enteritis, follow-up stroke. History of stroke, dementia, hyperlipidemia, chronic kidney disease. EXAM: MRA HEAD WITHOUT CONTRAST TECHNIQUE: Angiographic images of the Circle of Willis were obtained using MRA technique without intravenous contrast. COMPARISON:  MRI of the brain November 09, 2015 at 1645 hours FINDINGS:  Anterior circulation: Normal flow related enhancement of the included cervical, petrous, cavernous and supraclinoid internal carotid arteries. Patent anterior communicating artery. Flow related enhancement of the anterior and middle cerebral arteries, including distal segments. Mild stenosis RIGHT M1 and M2 segments. No large vessel occlusion, high-grade stenosis, abnormal luminal irregularity, aneurysm. Posterior circulation: RIGHT vertebral artery is dominant. Basilar artery is patent, with normal flow related enhancement of the main branch vessels. Normal flow related enhancement of the posterior cerebral arteries. No large vessel occlusion, high-grade stenosis, abnormal luminal irregularity, aneurysm. IMPRESSION: No acute large vessel or high-grade stenosis. Mild stenosis RIGHT M1 and M2 segments most compatible with atherosclerosis. Electronically Signed   By: Elon Alas M.D.   On: 11/09/2015 22:46    Assessment/Plan  Generalized weakness Will have her work with physical therapy and occupational therapy team to help with gait training and muscle strengthening exercises.fall precautions. Skin care. Encourage to be out of bed.   CVA With embolic infarct. Has 30 day event monitor. Reviewed echocardiogram and carotid doppler. Continue aspirin 325 mg daily, atorvastatin 10 mg daily. Needs neurology follow up in 2 months  UTI Continue and complete course of levaquin on 11/18/15. Maintain hydration  Protein calorie malnutrition Poor po intake, difficulty swallowing. Get dietary and SLP consult. Continue megace and remeron with MVI  Dysphagia Get SLP consult, hx of esophageal stricture. Aspiration precautions with her dementia  Esophageal stricture Continue famotidine 20 mg daily  Dementia Without behavioral disturbance. To provide assistance with ADLs, pressure ulcer prophylaxis, fall  precautions, continue namenda xr 28 mg daily  Urinary retention Continue foley catheter and continue  foley care. Has f/u with urology. Continue flomax  Iron def Anemia Continue niferex 150 mg bid  ckd stage 3 Monitor renal function  Depression and anxiety Continue ativan 0.5 mg bid, fluoxetine 20 mg daily, remeron 15 mg daily  Goals of care: short term rehabilitation   Labs/tests ordered: cbc, cmp 11/20/15  Family/ staff Communication: reviewed care plan with patient and nursing supervisor    Blanchie Serve, MD  [place] [phone] (Monday-Friday 8 am - 5 pm) [phone] (afterhours)

## 2015-12-01 ENCOUNTER — Non-Acute Institutional Stay (SKILLED_NURSING_FACILITY): Payer: Medicare Other | Admitting: Internal Medicine

## 2015-12-01 DIAGNOSIS — N3001 Acute cystitis with hematuria: Secondary | ICD-10-CM | POA: Diagnosis not present

## 2015-12-01 DIAGNOSIS — R339 Retention of urine, unspecified: Secondary | ICD-10-CM

## 2015-12-01 DIAGNOSIS — F0390 Unspecified dementia without behavioral disturbance: Secondary | ICD-10-CM | POA: Diagnosis not present

## 2015-12-01 DIAGNOSIS — R5383 Other fatigue: Secondary | ICD-10-CM

## 2015-12-01 NOTE — Progress Notes (Signed)
Patient ID: Melanie Levy, female   DOB: August 11, 1928, 82 y.o.   MRN: [id number]    Facility: [facility]   Chief Complaint  Patient presents with  . Acute Visit    hematuria, decreased enrgy level and increased sleepiness   Allergies reviewed   HPI:  82 y.o. patient is seen for acute visit. Staff have noticed her to have hematuria in her foley bag. She has been lethargic and sleeping more with poor po intake as per staff. Patient is here for short term rehabilitation post hospital admission from [id number] with metabolic encephalopathy in setting of UTI and CVA. She has past medical history of iron deficiency anemia, gait instability, depression and anxiety, chronic kidney disease stage III, dementia, GERD, hypothyroidism. She is seen in her room today. She is pleasantly confused and not participating in HPI and ROS for me. Per staff, no acute behavior changes and no temperature spike. No vomiting or diarrhea reported.  Review of Systems:  Unable to obtain  Past Medical History  Diagnosis Date  . Diverticulosis   . Esophageal stricture   . Hiatal hernia   . Iron deficiency anemia   . Depression   . Anxiety   . Arthritis   . Asthma   . Gastric ulcer 1994  . Partial small bowel obstruction ([place]) 1995  . IBS (irritable bowel syndrome)   . GERD (gastroesophageal reflux disease)   . Osteoporosis   . Insomnia   . Macular degeneration   . Seborrheic keratosis   . Stroke ([place])   . Dementia   . CKD (chronic kidney disease) stage 3, GFR 30-59 ml/min 10/14/2015  . Hypothyroidism   . Hyperlipidemia    Medication reviewed. See [facility]  Physical exam BP 130/60 mmHg  Pulse 86  Temp(Src) 97.1 F (36.2 C)  Resp 18  General- elderly female, frail, in no acute distress Head- normocephalic, atraumatic Nose- no nasal discharge Throat- moist mucus membrane  Eyes- no pallor, no icterus Neck- no cervical lymphadenopathy Cardiovascular- normal s1,s2, +  murmur, no leg edema Respiratory- bilateral clear to auscultation, no wheeze, no rhonchi, no crackles, no use of accessory muscles Abdomen- bowel sounds present, soft, non tender, has foley in place with hematuria noted Musculoskeletal- able to move all 4 extremities, generalized weakness and is on wheelchair Neurological- confused Skin- warm and dry  Labs-  U/a + leukocytes and nitrites and turbid in color Urine culture- mixed flora < 100,000  Assessment/plan  Acute cystitis With hematuria. Send another u/a with c/s. Given her symptom, will have them flush her foley and start her empirically on nitrofurantoin 100 mg q12h x 1 week for now. Continue foley care and encourage hydration. Check cbc wit diff, cmp 12/04/15. Monitor VS, wbc and temperature curve  Lethargy Likely in setting of her UTI. Treat as above  Dementia Without behavioral disturbance. To provide assistance with ADLs, pressure ulcer prophylaxis, fall precautions, continue namenda xr 28 mg daily  Urinary retention Continue foley catheter and continue foley care. Continue flomax    Blanchie Serve, MD  [facility] [phone] (Monday-Friday 8 am - 5 pm) [phone] (afterhours)

## 2015-12-05 ENCOUNTER — Non-Acute Institutional Stay (SKILLED_NURSING_FACILITY): Payer: Medicare Other | Admitting: Family

## 2015-12-05 DIAGNOSIS — D72829 Elevated white blood cell count, unspecified: Secondary | ICD-10-CM

## 2015-12-05 DIAGNOSIS — E46 Unspecified protein-calorie malnutrition: Secondary | ICD-10-CM

## 2015-12-05 NOTE — Progress Notes (Signed)
Patient ID: Melanie Levy, female   DOB: [birth year], 82 y.o.   MRN: [id number]  Location: [facility]  Provider: Aundria Rud, MD  Code Status:  DNR Goals of care: Advanced Directive information Advanced Directives 10/28/2015  Does patient have an advance directive? No  Does patient want to make changes to advanced directive? -  Copy of advanced directive(s) in chart? -  Would patient like information on creating an advanced directive? No - patient declined information     No chief complaint on file.   HPI:  Pt is a 82 y.o. female seen today at [facility] for Leukocytosis and Protein Malnutrition.She has past medical history of iron deficiency anemia, gait instability, depression and anxiety, chronic kidney disease stage III, dementia, GERD, hypothyroidism. She is seen in her room today.Her  Recent WBC 15.7, Neutrophils 91.0 (12/04/2015). Unable to obtain history due to presence of dementia. Facility Nurse states patient mental status  at baseline.   Review of Systems  Unable to perform ROS: dementia    Past Medical History  Diagnosis Date   Diverticulosis    Esophageal stricture    Hiatal hernia    Iron deficiency anemia    Depression    Anxiety    Arthritis    Asthma    Gastric ulcer 1994   Partial small bowel obstruction (HCC) 1995   IBS (irritable bowel syndrome)    GERD (gastroesophageal reflux disease)    Osteoporosis    Insomnia    Macular degeneration    Seborrheic keratosis    Stroke (HCC)    Dementia    CKD (chronic kidney disease) stage 3, GFR 30-59 ml/min 10/14/2015   Hypothyroidism    Hyperlipidemia    Past Surgical History  Procedure Laterality Date   Cholecystectomy     Appendectomy     Tonsillectomy     Spine surgery     Bunionectomy     Sinus surgery with instatrak     Abdominal hysterectomy       Allergies  Allergen Reactions    Iohexol      Desc: Pt states that years ago she had a procedure that involved IV contrast and had throat swelling and sob. She was told never to have IV contrast again.  Pt is a very poor historian and cannot remember the date or the procedure.    Milk-Related Compounds Nausea And Vomiting   Nexium [Esomeprazole Magnesium] Other (See Comments)    Per MAR   Penicillins Swelling    Tongue swelling Has patient had a PCN reaction causing immediate rash, facial/tongue/throat swelling, SOB or lightheadedness with hypotension: No Has patient had a PCN reaction causing severe rash involving mucus membranes or skin necrosis: No Has patient had a PCN reaction that required hospitalization No Has patient had a PCN reaction occurring within the last 10 years: No If all of the above answers are "NO", then may proceed with Cepha      Medication List       This list is accurate as of: 12/05/15  6:17 PM.  Always use your most recent med list.               acetaminophen 325 MG tablet  Commonly known as:  TYLENOL  Take 650 mg by mouth every 6 (six) hours as needed for moderate pain or fever.     aspirin 325 MG tablet  Take 1 tablet (325 mg total) by  mouth daily.     atorvastatin 10 MG tablet  Commonly known as:  LIPITOR  Take 10 mg by mouth daily at 6 PM.     BREO ELLIPTA 100-25 MCG/INH Aepb  Generic drug:  Fluticasone Furoate-Vilanterol  Inhale 1 puff into the lungs daily.     clotrimazole 1 % cream  Commonly known as:  LOTRIMIN  Apply 1 application topically 2 (two) times daily as needed (rash).     famotidine 20 MG tablet  Commonly known as:  PEPCID  Take 1 tablet (20 mg total) by mouth daily.     FLUoxetine 20 MG capsule  Commonly known as:  PROZAC  Take 20 mg by mouth daily.     guaifenesin 100 MG/5ML syrup  Commonly known as:  ROBITUSSIN  Take 100 mg by mouth 4 (four) times daily as needed for cough.     iron polysaccharides 150 MG capsule  Commonly known as:  NIFEREX    Take 150 mg by mouth 2 (two) times daily.     levothyroxine 75 MCG tablet  Commonly known as:  SYNTHROID, LEVOTHROID  Take 75 mcg by mouth daily before breakfast.     loratadine 10 MG tablet  Commonly known as:  CLARITIN  Take 10 mg by mouth daily.     LORazepam 0.5 MG tablet  Commonly known as:  ATIVAN  Take 1 tablet (0.5 mg total) by mouth 2 (two) times daily.     megestrol 40 MG/ML suspension  Commonly known as:  MEGACE  Take 10 mLs (400 mg total) by mouth daily.     mirtazapine 15 MG tablet  Commonly known as:  REMERON  Take 15 mg by mouth at bedtime.     NAMENDA XR 28 MG Cp24 24 hr capsule  Generic drug:  memantine  Take 28 mg by mouth daily.     nitrofurantoin 100 MG capsule  Commonly known as:  MACRODANTIN  Take 100 mg by mouth 2 (two) times daily.     PRESCRIPTION MEDICATION  Apply 1 application topically as needed (for itching to skin). TRIAMCINOLONE 0.1% AND HYDROCERIN 2:1 CREAM     PRESERVISION AREDS 2 PO  Take 1 capsule by mouth 2 (two) times daily.     tamsulosin 0.4 MG Caps capsule  Commonly known as:  FLOMAX  Take 1 capsule (0.4 mg total) by mouth daily after supper.     traMADol 50 MG tablet  Commonly known as:  ULTRAM  Take 1 tablet (50 mg total) by mouth every 8 (eight) hours as needed (for pain).     triamcinolone cream 0.5 %  Commonly known as:  KENALOG  Apply 1 application topically 3 (three) times daily as needed (rash). Apply to nose and neck rash as needed     Vitamin D (Ergocalciferol) 50000 units Caps capsule  Commonly known as:  DRISDOL  Take 50,000 Units by mouth every 7 (seven) days.        Immunization History  Administered Date(s) Administered   Tdap 08/24/2013   Pertinent  Health Maintenance Due  Topic Date Due   DEXA SCAN  05/11/1995   PNA vac Low Risk Adult (1 of 2 - PCV13) 05/11/1995   INFLUENZA VACCINE  06/12/2015   No flowsheet data found.  Filed Vitals:   12/05/15 1648  BP: 118/70  Pulse: 88  Temp: 99.4  F (37.4 C)  Resp: 20  Weight: 101 lb 6.4 oz (45.995 kg)   Body mass index is 17.4 kg/(m^2). Physical  Exam  Constitutional:  Frail elderly in no acute distress.   HENT:  Head: Normocephalic.  Eyes: Conjunctivae are normal. Pupils are equal, round, and reactive to light.  Neck: Normal range of motion.  Cardiovascular: Normal rate and regular rhythm.   Pulmonary/Chest: Effort normal.  Bilateral upper lobes rales.   Abdominal: Soft. Bowel sounds are normal. She exhibits no distension. There is no tenderness.  Musculoskeletal: Normal range of motion. She exhibits no edema or tenderness.  Neurological: She is alert.  Self. Non-verbal during visit but follows provider with eyes.   Skin: Skin is warm and dry.  Psychiatric: She has a normal mood and affect.    Labs reviewed:  Recent Labs  10/14/15 1222  10/16/15 0510  11/09/15 1446 11/10/15 0247 11/12/15 0447  NA 142  < > 142  < > 143 141 141  K 4.6  < > 3.9  < > 3.9 3.8 4.2  CL 106  < > 111  < > 108 114* 113*  CO2 27  < > 25  < > 22 18* 20*  GLUCOSE 110*  < > 90  < > 122* 81 87  BUN 26*  < > 14  < > 23* 18 20  CREATININE 1.16*  < > 1.08*  < > 1.38* 1.16* 1.21*  CALCIUM 9.0  < > 7.8*  < > 8.9 8.0* 8.6*  MG 2.2  --  1.7  --   --   --   --   < > = values in this interval not displayed.  Recent Labs  10/14/15 1222 10/15/15 0810 11/09/15 1446  AST 24 17 24   ALT 16 14 20   ALKPHOS 84 69 92  BILITOT 0.8 0.7 0.3  PROT 6.8 5.6* 5.9*  ALBUMIN 4.0 3.2* 3.3*    Recent Labs  10/23/15 0946 10/27/15 2318 11/09/15 1446 11/10/15 0247 11/12/15 0447  WBC 9.0 9.0 15.0* 13.2* 6.2  NEUTROABS 6.2 6.4 12.9*  --   --   HGB 12.1 11.4* 11.4* 9.8* 10.7*  HCT 37.5 35.7* 35.3* 29.6* 32.4*  MCV 97.9 100.0 97.8 97.7 97.0  PLT 205 248 171 142* 188   Lab Results  Component Value Date   TSH 0.451 10/15/2015   Lab Results  Component Value Date   HGBA1C 5.5 11/10/2015   Lab Results  Component Value Date   CHOL 78 11/10/2015   HDL  38* 11/10/2015   LDLCALC 33 11/10/2015   TRIG 37 11/10/2015   CHOLHDL 2.1 11/10/2015    Significant Diagnostic Results in last 30 days:  Dg Chest 2 View  11/09/2015  CLINICAL DATA:  Altered mental status. Right facial droop. Weakness. EXAM: CHEST  2 VIEW COMPARISON:  Chest x-rays dated 10/27/2015 and 08/24/2013 and CT scan of the chest dated 03/19/2006 FINDINGS: Heart size and pulmonary vascularity are normal and the lungs are clear. Large hiatal hernia. Stable cyst on the lateral aspect of the right hemidiaphragm, unchanged since 2007. There is a compression fracture of the superior aspect of T10, new since 08/24/2013. IMPRESSION: Compression fracture of T10, new since 2014. It is unchanged since 10/27/2015 no better seen on today's study because of the lateral view. Chronic large hiatal hernia. Stable cyst on the right hemidiaphragm. Electronically Signed   By: Lorriane Shire M.D.   On: 11/09/2015 15:09   Ct Head Wo Contrast  11/09/2015  CLINICAL DATA:  Altered mental status, lethargy today, RIGHT-side facial droop, leaning to RIGHT side, weakness, unknown when last seen normal EXAM: CT  HEAD WITHOUT CONTRAST TECHNIQUE: Contiguous axial images were obtained from the base of the skull through the vertex without intravenous contrast. COMPARISON:  10/28/2015 FINDINGS: Asymmetric positioning in gantry. Generalized atrophy with ex vacuo dilatation of the ventricular system. No midline shift or mass effect. Minimal small vessel chronic ischemic changes of deep cerebral white matter. No intracranial hemorrhage, mass lesion or evidence acute infarction. Benign-appearing basal ganglia calcifications again identified. No extra-axial fluid collections. Question small mucosal retention cyst within the RIGHT maxillary sinus. Remaining bones and sinuses unremarkable. IMPRESSION: Atrophy with minimal small vessel chronic ischemic changes of deep cerebral white matter. No acute intracranial abnormalities.  Electronically Signed   By: Lavonia Dana M.D.   On: 11/09/2015 15:17   Mr Brain Wo Contrast (neuro Protocol)  11/09/2015  CLINICAL DATA:  Acute presentation with altered mental status. Lethargy, right facial drooping confusion. EXAM: MRI HEAD WITHOUT CONTRAST TECHNIQUE: Multiplanar, multiecho pulse sequences of the brain and surrounding structures were obtained without intravenous contrast. COMPARISON:  Head CT same day, MRI 08/26/2013 FINDINGS: There are approximately 12 micro embolic infarctions scattered throughout the brain consistent with embolic disease from the heart or ascending aorta. Several punctate foci are scattered within the cerebellum. Other punctate foci are present within the frontoparietal cortex and deep white matter. No large vessel territory infarction. No mass lesion, hemorrhage, hydrocephalus or extra-axial collection. Chronic small-vessel ischemic changes are present throughout the brainstem and cerebral hemispheric white matter. There is generalized brain atrophy. No pituitary mass. No inflammatory sinus disease. Major vessels at the base of the brain show flow. IMPRESSION: Approximately 12 acute micro embolic infarctions scattered throughout the brain, consistent with embolic disease from the heart or ascending aorta. No large confluent infarction. No mass effect or hemorrhage. Electronically Signed   By: Nelson Chimes M.D.   On: 11/09/2015 17:30   Dg Chest Port 1 View  11/10/2015  CLINICAL DATA:  Leukocytosis EXAM: PORTABLE CHEST 1 VIEW COMPARISON:  November 09, 2015 FINDINGS: There is no edema or consolidation. Heart is normal in size and contour with pulmonary vascular within normal limits. There is a sizable hiatal type hernia. No adenopathy. There is atherosclerotic calcification in the aorta. IMPRESSION: No edema or consolidation. No change in cardiac silhouette. Sizable hiatal hernia present. Electronically Signed   By: Lowella Grip III M.D.   On: 11/10/2015 08:04   Mr  Jodene Nam Head/brain Wo Cm  11/09/2015  CLINICAL DATA:  Recent diagnosis of bacterial enteritis, follow-up stroke. History of stroke, dementia, hyperlipidemia, chronic kidney disease. EXAM: MRA HEAD WITHOUT CONTRAST TECHNIQUE: Angiographic images of the Circle of Willis were obtained using MRA technique without intravenous contrast. COMPARISON:  MRI of the brain November 09, 2015 at 1645 hours FINDINGS: Anterior circulation: Normal flow related enhancement of the included cervical, petrous, cavernous and supraclinoid internal carotid arteries. Patent anterior communicating artery. Flow related enhancement of the anterior and middle cerebral arteries, including distal segments. Mild stenosis RIGHT M1 and M2 segments. No large vessel occlusion, high-grade stenosis, abnormal luminal irregularity, aneurysm. Posterior circulation: RIGHT vertebral artery is dominant. Basilar artery is patent, with normal flow related enhancement of the main branch vessels. Normal flow related enhancement of the posterior cerebral arteries. No large vessel occlusion, high-grade stenosis, abnormal luminal irregularity, aneurysm. IMPRESSION: No acute large vessel or high-grade stenosis. Mild stenosis RIGHT M1 and M2 segments most compatible with atherosclerosis. Electronically Signed   By: Elon Alas M.D.   On: 11/09/2015 22:46    Assessment/Plan 1. Leukocytosis Low grade Temp 99.4 ,  WBC 15.7, neutrophils 91.0( 12/04/2015). Currently on Nitrofurantoin 100 mg Bid for UTI.Bilateral rales noted on exam. Ordering portable CXR 2 views. Tylenol PRN for fever. Continue with V/S B/p, HR, temp twice daily. Recheck CBC/diff 12/07/2015  2. Protein-calorie malnutrition (HCC) TP 5.5, ALb 3.00(12/04/2015). Dietician consult for protein supplements. Recheck BMP 12/07/2015    Family/ staff Communication: Reviewed plan with facility staff  Labs/tests ordered: CBC/diff, CMP 12/07/2015

## 2015-12-13 DEATH — deceased

## 2016-01-16 ENCOUNTER — Ambulatory Visit: Payer: Self-pay | Admitting: Neurology

## 2016-10-17 IMAGING — CR DG ABDOMEN ACUTE W/ 1V CHEST
3 series · 3 of 3 positions shown · non-contrast
Comparison: Chest and abdominal radiographs 10/16/2015. CT
10/14/2015

CLINICAL DATA: Mid and lower abdominal pain for 1 week, evaluate
for obstruction.

EXAM:
DG ABDOMEN ACUTE W/ 1V CHEST

[x abdomen supine]
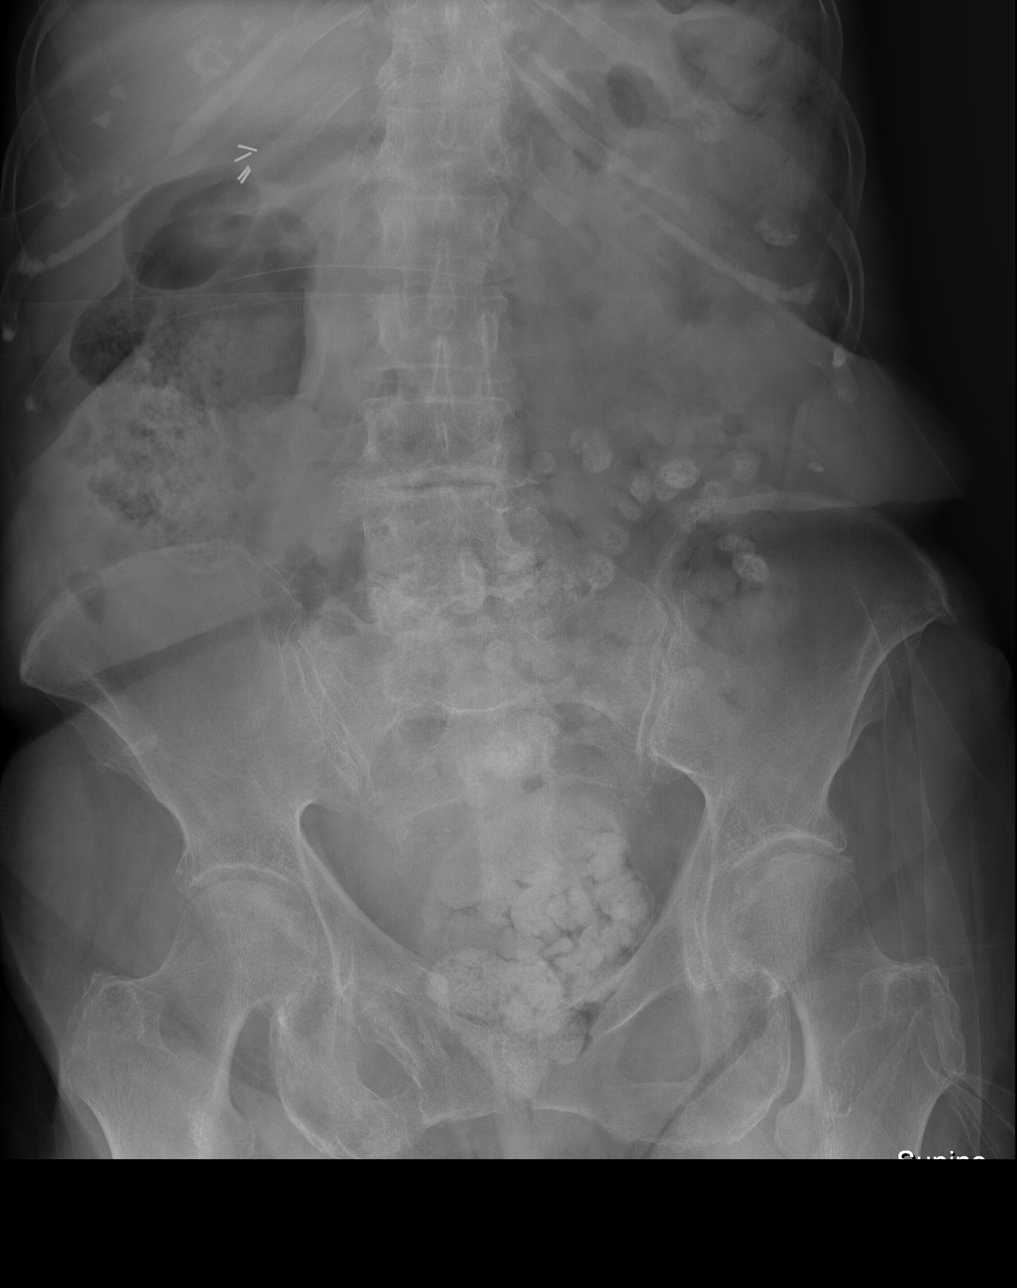

[x chest ap]
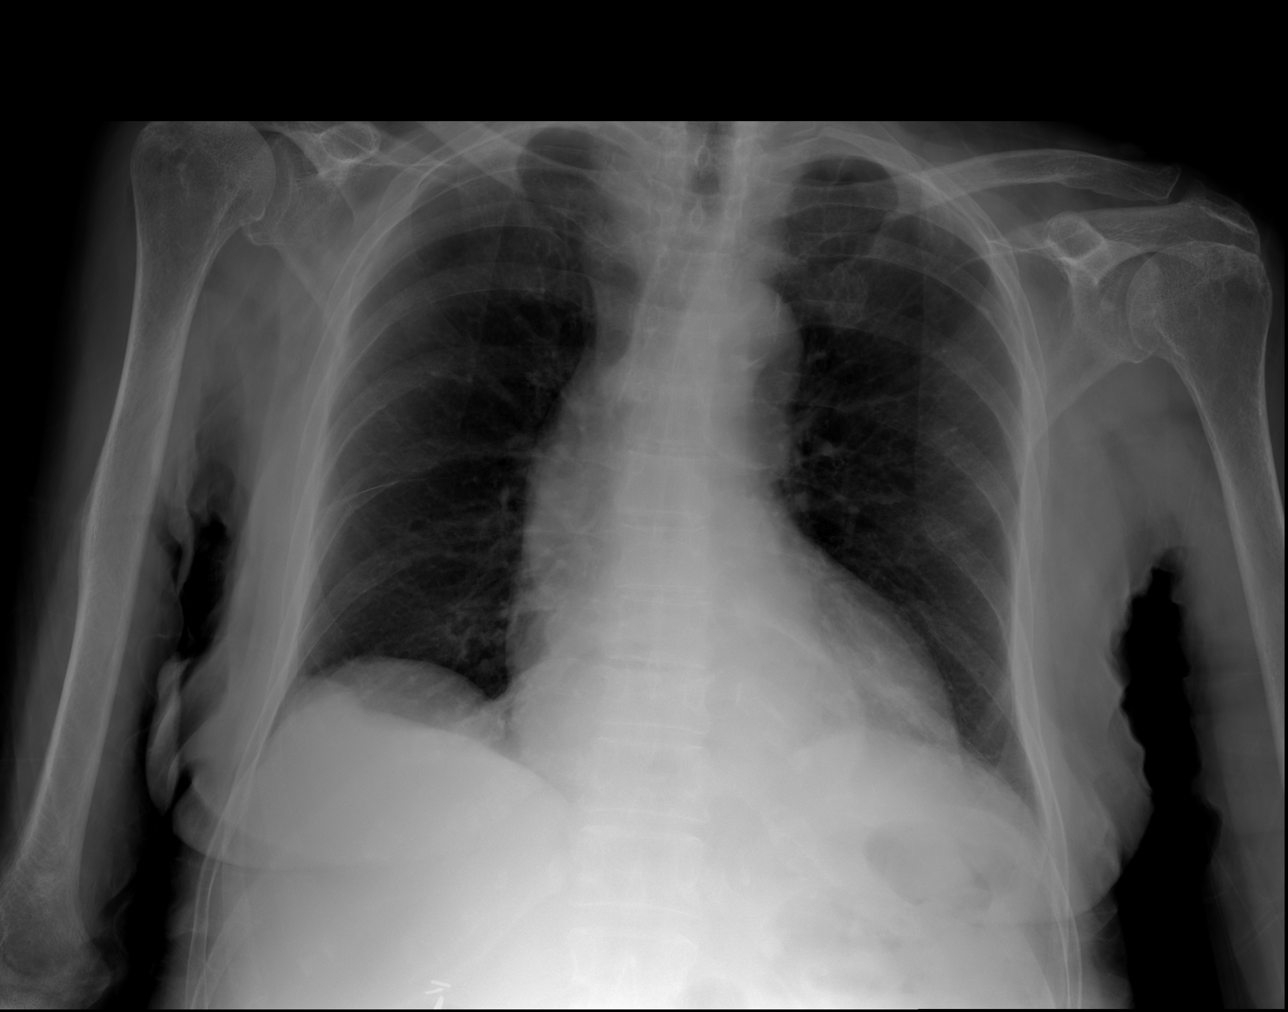

[w abdomen decub]
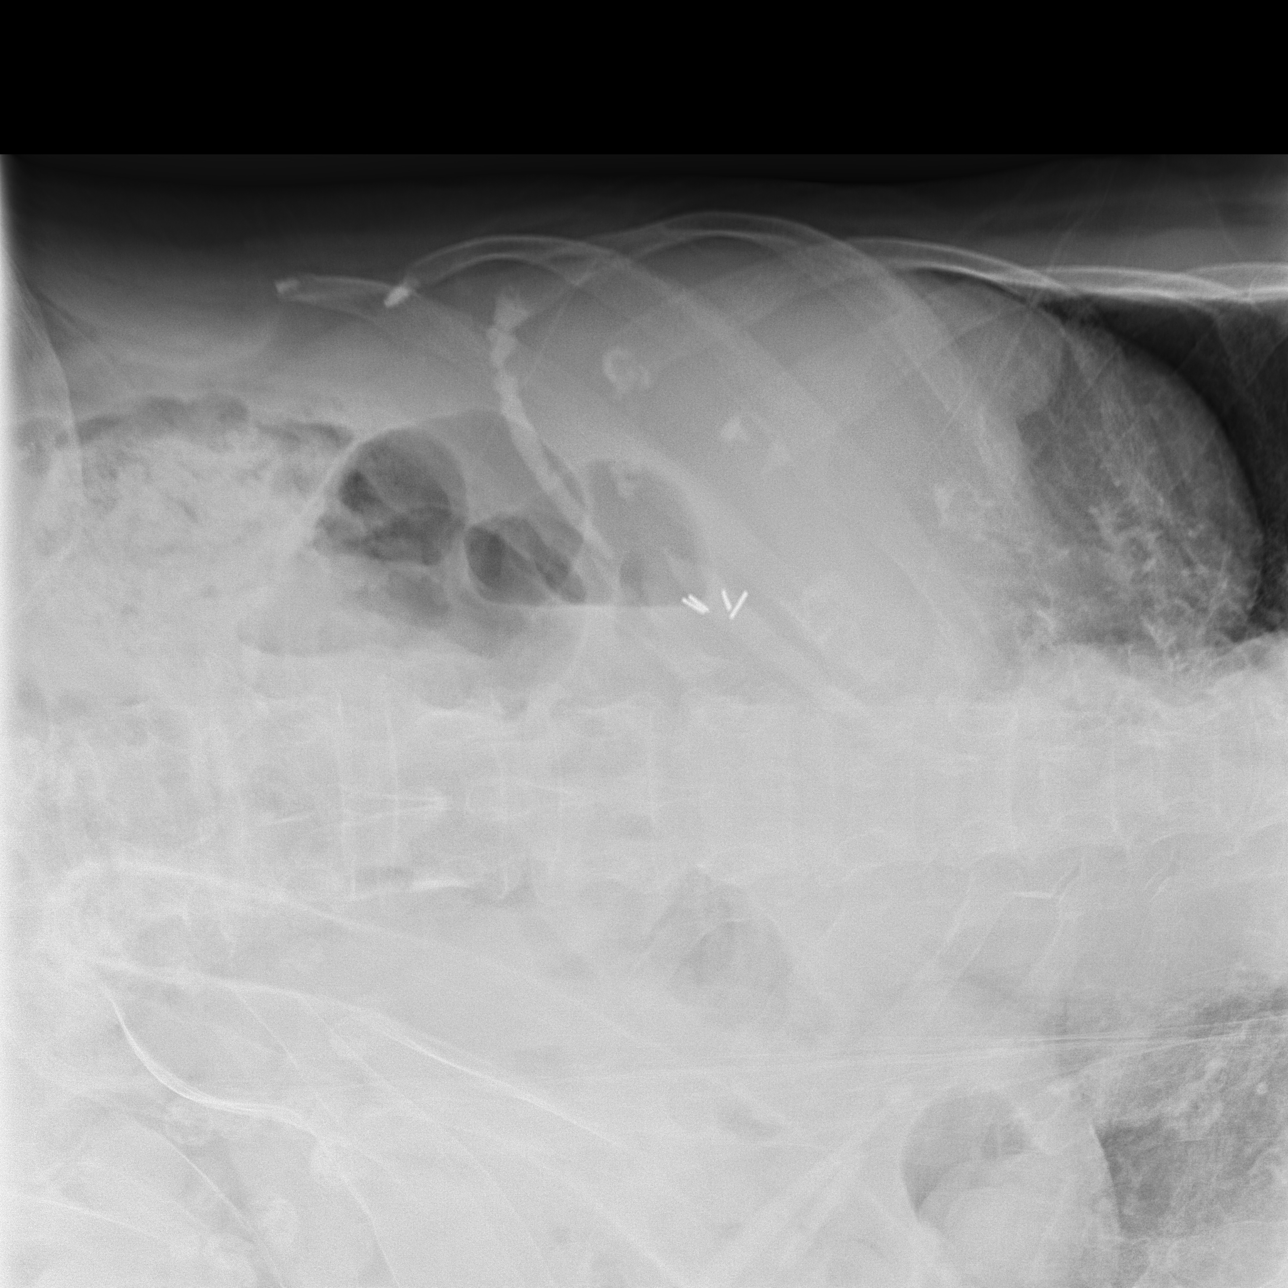

[3 of 3 positions shown; findings below may reference images not displayed]

FINDINGS: Large hiatal hernia. Cardiomediastinal contours are unchanged. No
consolidation.

No free intra-abdominal air. No dilated bowel loops to suggest
obstruction. Moderate stool throughout the colon with enteric
contrast within multiple distal colonic diverticula from prior CT. A
stool ball distends the rectum. Surgical clips in the right upper
quadrant the abdomen from cholecystectomy. Remote right pelvis
fracture again seen. Bones under mineralized.
IMPRESSION: 1. No evidence of bowel obstruction. Moderate stool burden with
stool ball in the rectum. Diverticulosis with retained enteric
contrast within colonic diverticula.
2. Hiatal hernia.

## 2016-10-18 IMAGING — CT CT HEAD W/O CM
4 of 5 series · 14 of 47 positions shown, 16 images · non-contrast
Comparison: 04/12/2015

CLINICAL DATA: Fall.

EXAM:
CT HEAD WITHOUT CONTRAST
CT CERVICAL SPINE WITHOUT CONTRAST
TECHNIQUE: Multidetector CT imaging of the head and cervical spine was
performed following the standard protocol without intravenous
contrast. Multiplanar CT image reconstructions of the cervical spine
were also generated.

[Series 2: head w/o · axial · non-contrast · 0.43mm/px · z∈[-180,-80]mm · 5 of 31 slices shown, 7 images]
[im 6/31  brain]
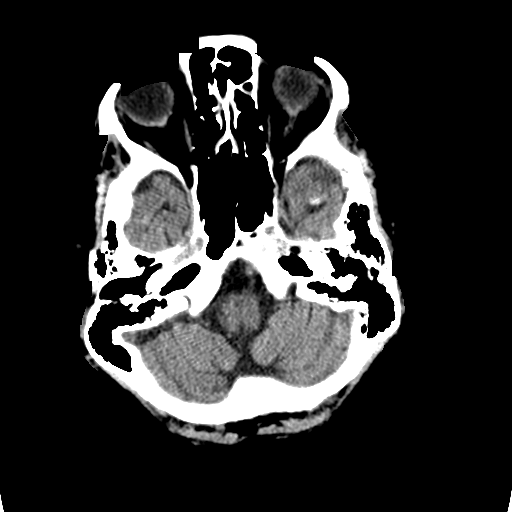
[im 6/31  bone]
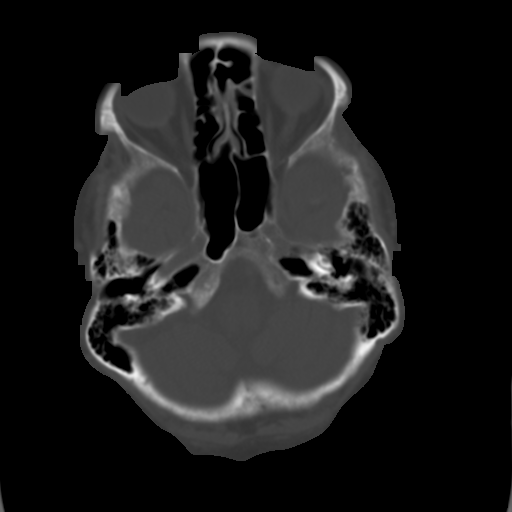
[im 11/31  brain]
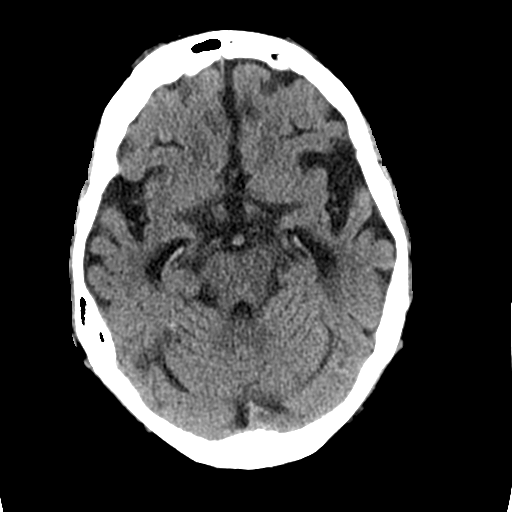
[im 16/31  brain]
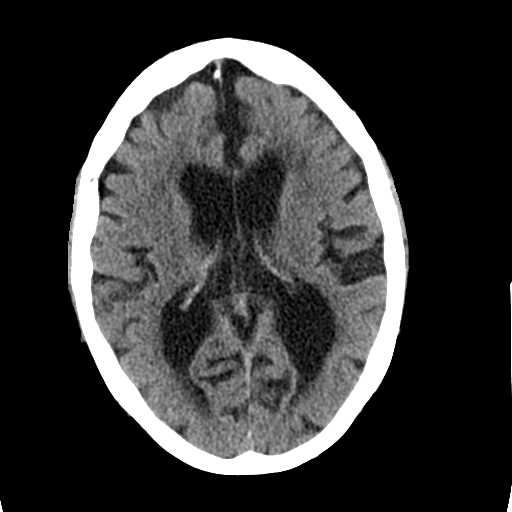
[im 21/31  brain]
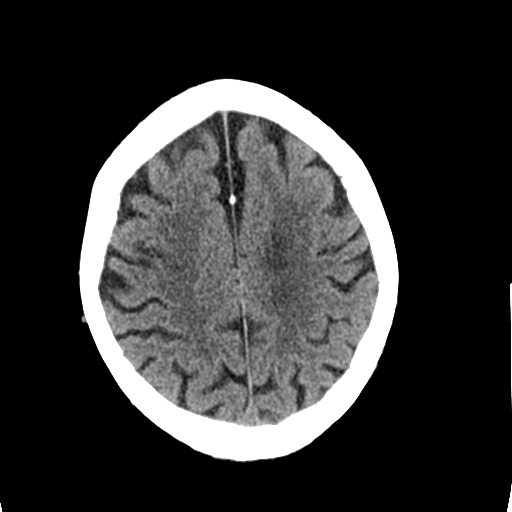
[im 26/31  brain]
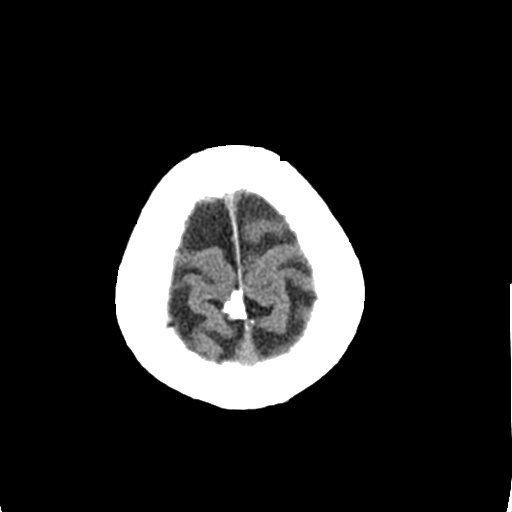
[im 26/31  bone]
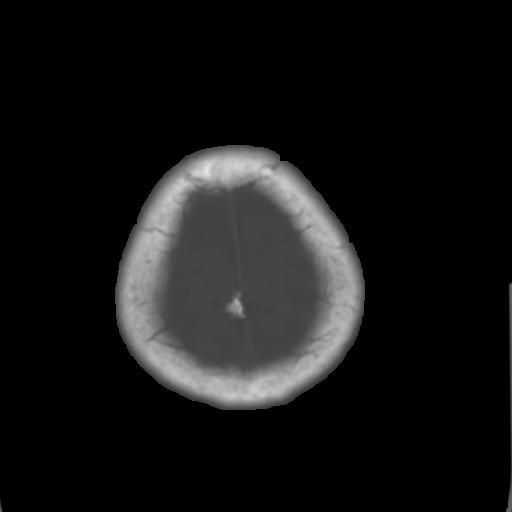

[Series 3: bone windows · axial · 0.43mm/px · z∈[-175,-115]mm · 3 of 31 slices shown]
[im 7/31  bone]
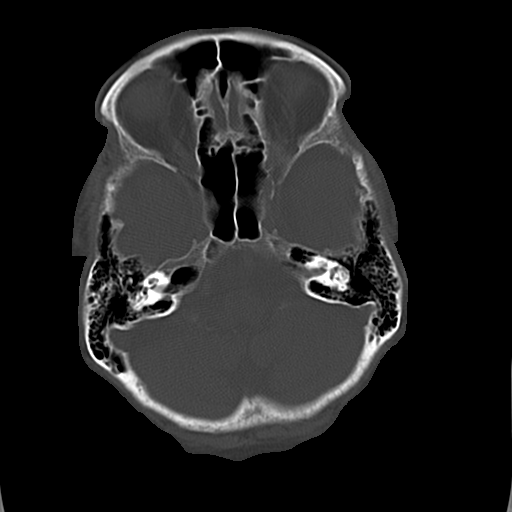
[im 13/31  bone]
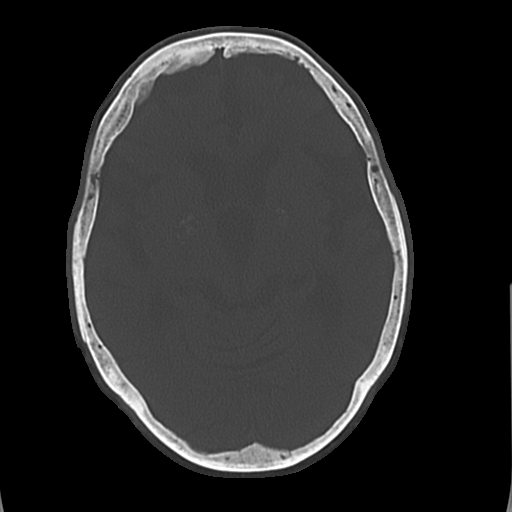
[im 19/31  bone]
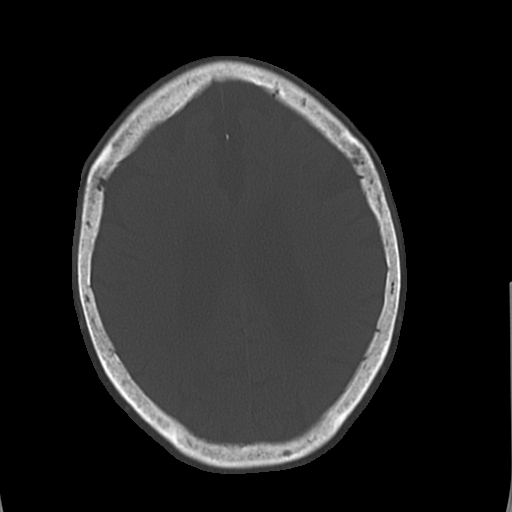

[Series 8: coronal · coronal · 0.24mm/px · 3 of 61 slices shown]
[im 21/61  brain]
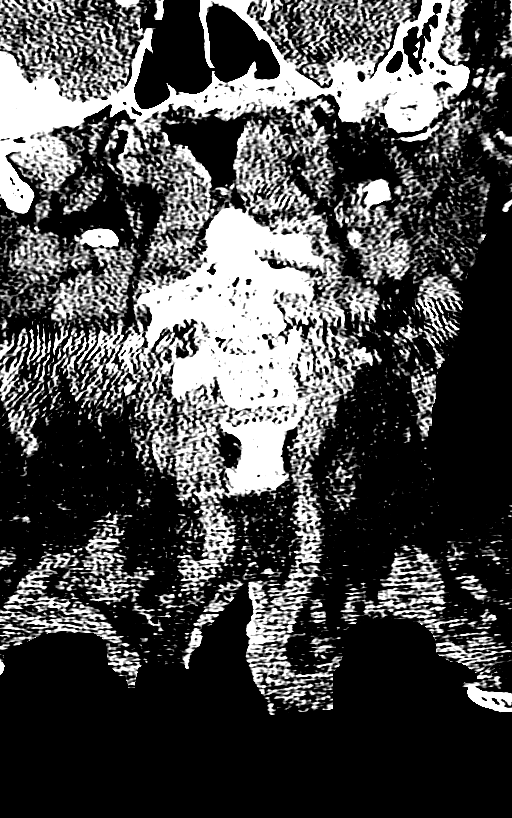
[im 27/61  brain]
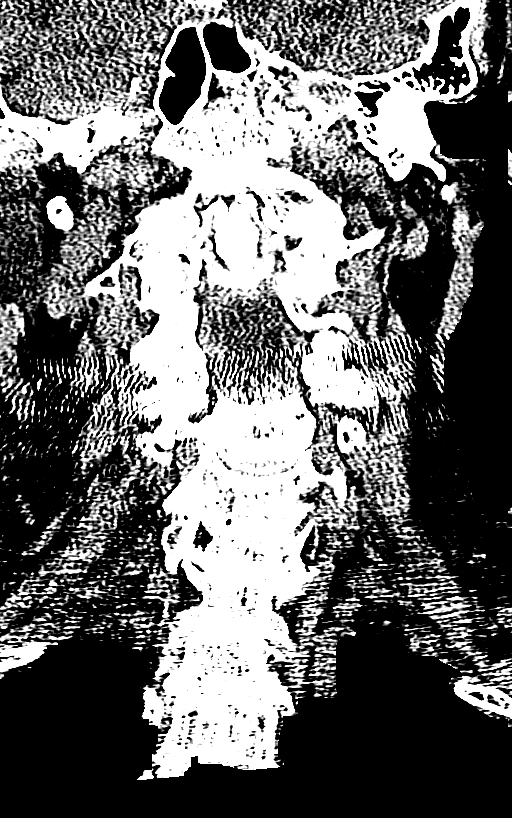
[im 34/61  brain]
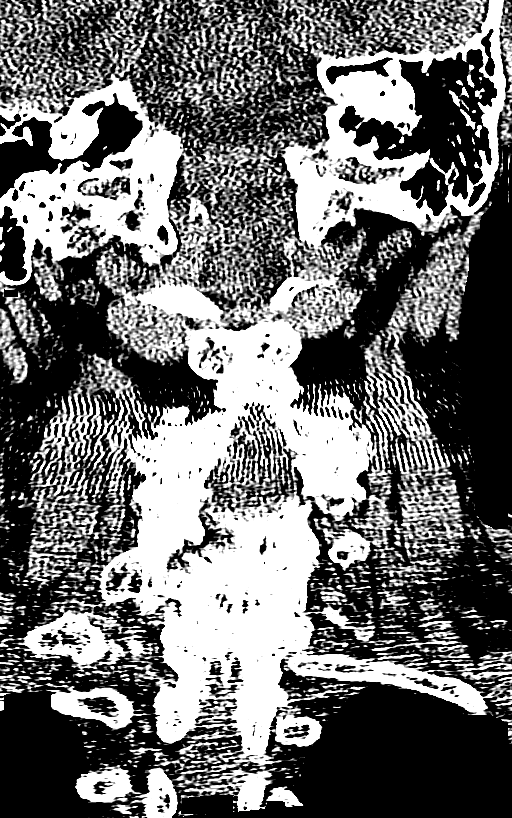

[Series 9: sagittal · sagittal · 0.24mm/px · 3 of 61 slices shown]
[im 21/61  brain]
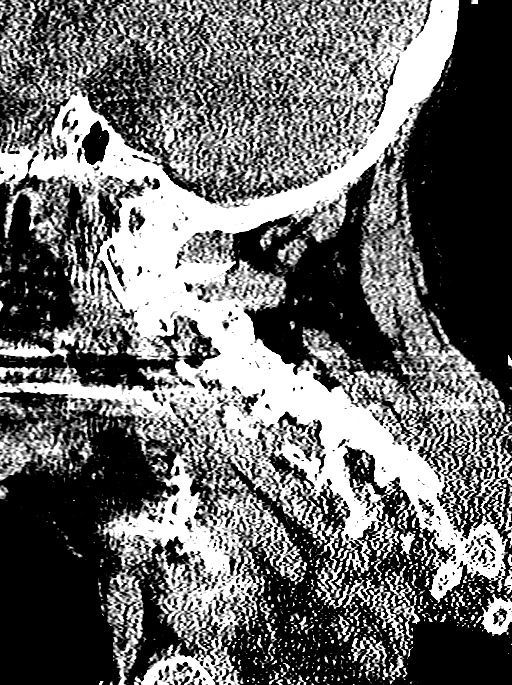
[im 31/61  brain]
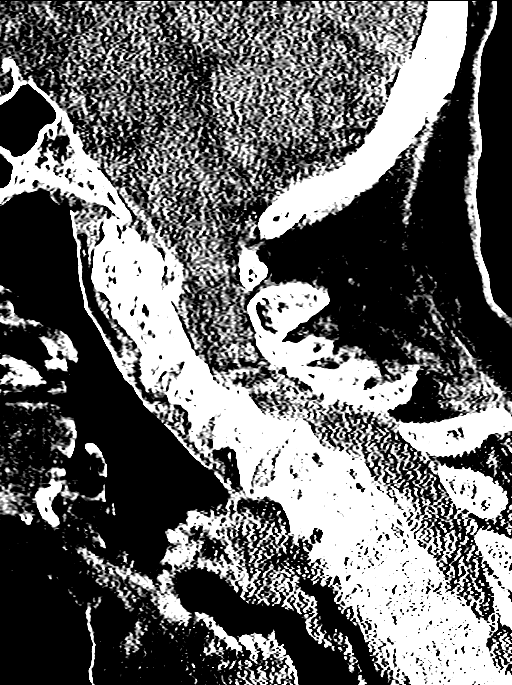
[im 41/61  brain]
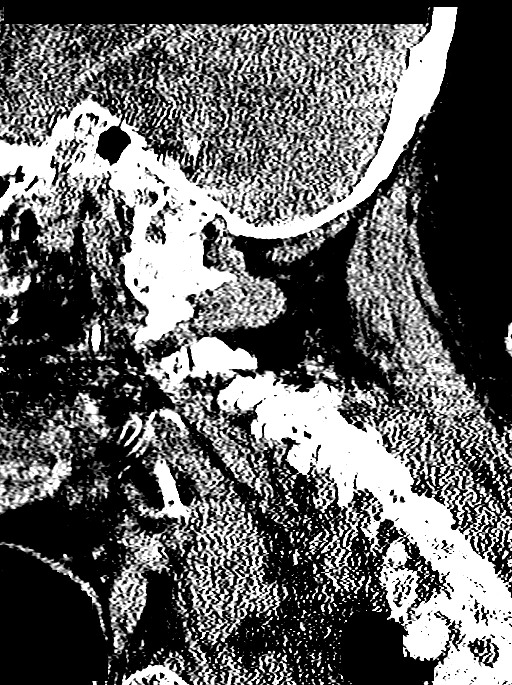

[14 of 47 positions shown; findings below may reference images not displayed]

FINDINGS: CT HEAD FINDINGS

Prominence of the sulci and ventricles compatible with brain
atrophy. There is low attenuation within the subcortical and
periventricular white matter compatible with chronic microvascular
disease. No evidence for acute intracranial hemorrhage, acute
cortical infarct or mass. No abnormal extra-axial fluid collections
identified. The paranasal sinuses and mastoid air cells are clear.
The calvarium appears intact.

CT CERVICAL SPINE FINDINGS

There is reversal of normal cervical lordosis. The vertebral body
heights are well preserved. The facet joints are aligned. The
prevertebral soft tissue space appears normal. There is multi level
disc space narrowing and ventral endplate spurring. This is most
advanced at C5-6, C6-7 and C7-T1.
IMPRESSION: 1. No acute intracranial abnormalities.
2. Small vessel ischemic disease and chronic brain atrophy.
3. Cervical spondylosis.
4. No evidence for cervical spine fracture or subluxation.
